# Patient Record
Sex: Female | Born: 1950 | Race: White | Marital: Married | Smoking: Never smoker
Health system: Western US, Academic
[De-identification: ages and names within clinical notes are randomized; demographics above are authoritative.]

## PROBLEM LIST (undated history)

## (undated) DIAGNOSIS — R471 Dysarthria and anarthria: Secondary | ICD-10-CM

## (undated) DIAGNOSIS — D492 Neoplasm of unspecified behavior of bone, soft tissue, and skin: Secondary | ICD-10-CM

## (undated) DIAGNOSIS — I10 Essential (primary) hypertension: Secondary | ICD-10-CM

## (undated) DIAGNOSIS — E119 Type 2 diabetes mellitus without complications: Secondary | ICD-10-CM

## (undated) DIAGNOSIS — H409 Unspecified glaucoma: Secondary | ICD-10-CM

## (undated) DIAGNOSIS — G8929 Other chronic pain: Secondary | ICD-10-CM

## (undated) DIAGNOSIS — G249 Dystonia, unspecified: Secondary | ICD-10-CM

## (undated) DIAGNOSIS — E6609 Other obesity due to excess calories: Secondary | ICD-10-CM

## (undated) DIAGNOSIS — E039 Hypothyroidism, unspecified: Secondary | ICD-10-CM

## (undated) DIAGNOSIS — E785 Hyperlipidemia, unspecified: Secondary | ICD-10-CM

## (undated) DIAGNOSIS — K900 Celiac disease: Secondary | ICD-10-CM

## (undated) DIAGNOSIS — G500 Trigeminal neuralgia: Secondary | ICD-10-CM

## (undated) DIAGNOSIS — I639 Cerebral infarction, unspecified: Secondary | ICD-10-CM

## (undated) DIAGNOSIS — G43909 Migraine, unspecified, not intractable, without status migrainosus: Secondary | ICD-10-CM

## (undated) DIAGNOSIS — M542 Cervicalgia: Secondary | ICD-10-CM

## (undated) DIAGNOSIS — Z6831 Body mass index (BMI) 31.0-31.9, adult: Secondary | ICD-10-CM

## (undated) HISTORY — DX: Essential (primary) hypertension: I10

## (undated) HISTORY — DX: Type 2 diabetes mellitus without complications: E11.9

## (undated) HISTORY — DX: Dysarthria and anarthria: R47.1

## (undated) HISTORY — DX: Neoplasm of unspecified behavior of bone, soft tissue, and skin: D49.2

## (undated) HISTORY — PX: APPENDECTOMY: SHX54

## (undated) HISTORY — PX: TONSILLECTOMY: SUR1361

## (undated) HISTORY — PX: CHOLECYSTECTOMY: SHX55

## (undated) HISTORY — PX: ABDOMINAL HYSTERECTOMY: SHX81

---

## 1989-10-08 HISTORY — PX: OTHER SURGICAL HISTORY: SHX169

## 2006-03-29 ENCOUNTER — Ambulatory Visit (HOSPITAL_BASED_OUTPATIENT_CLINIC_OR_DEPARTMENT_OTHER)

## 2006-04-05 ENCOUNTER — Ambulatory Visit (HOSPITAL_BASED_OUTPATIENT_CLINIC_OR_DEPARTMENT_OTHER)

## 2006-04-05 ENCOUNTER — Encounter (HOSPITAL_BASED_OUTPATIENT_CLINIC_OR_DEPARTMENT_OTHER): Payer: Self-pay | Admitting: Neurological Surgery

## 2006-04-08 NOTE — Progress Notes (Signed)
CLINIC: NEUROSURGERY    REPORT TYPE: ??    Dictating Practitioner: Wilburt Finlay, M.D.    DATE OF SERVICE: 04/05/2006    REASON FOR VISIT:follow up    Ms. Kimberly Rangel returns today after having had extensive imaging studies  because she had mild weakness of the triceps and some numbness on the right  side as well as perhaps some mild weakness in the leg. The imaging  studies, including an MRI, a CT scan of the cervical spine and plain  radiographs, are remarkably good. 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 As I indicated, I  have reviewed in detail the MRI, CT and plain radiographs and saw nothing.  The fusion of the neck is well healed from many years ago. It was  performed by a friend of mine, a Associate Professor in [place], Dr. Trena Platt, and everything looks perfect. She has a bit  of dysarthria because of the transoral approach with failure of the entire  palate to heal, but other than that has residual pain from this. We will  renew her Vicodin and Soma and she will only return if she is having  difficulty.      Job #: [id number]          Signature Derived From Controlled Access Password  Wilburt Finlay, M.D. 04/08/2006 09:50        DD: 04/05/2006 DT: 04/05/2006 5:00 P DocNo.: [id number]  [id number]      Primary Care Physician:  UNLISTED PHYSICIAN        cc:

## 2006-05-21 ENCOUNTER — Ambulatory Visit (HOSPITAL_BASED_OUTPATIENT_CLINIC_OR_DEPARTMENT_OTHER): Admitting: Nurse Practitioner

## 2013-01-29 ENCOUNTER — Encounter (HOSPITAL_BASED_OUTPATIENT_CLINIC_OR_DEPARTMENT_OTHER): Payer: Self-pay | Admitting: Neurology

## 2013-01-29 ENCOUNTER — Ambulatory Visit: Payer: Medicaid Other | Attending: Neurology | Admitting: Neurology

## 2013-01-29 VITALS — BP 163/99 | HR 83 | Wt 182.0 lb

## 2013-01-29 DIAGNOSIS — M436 Torticollis: Secondary | ICD-10-CM | POA: Insufficient documentation

## 2013-01-29 DIAGNOSIS — G500 Trigeminal neuralgia: Secondary | ICD-10-CM | POA: Insufficient documentation

## 2013-01-29 MED ORDER — METFORMIN HCL 850 MG OR TABS: 850.00 mg | ORAL_TABLET | Freq: Three times a day (TID) | ORAL | Status: AC

## 2013-01-29 MED ORDER — SYNTHROID PO: Freq: Every day | ORAL | Status: AC

## 2013-01-29 MED ORDER — GLYBURIDE PO: Freq: Two times a day (BID) | ORAL | Status: AC

## 2013-01-29 MED ORDER — CARBAMAZEPINE 200 MG OR TABS
400.00 mg | ORAL_TABLET | Freq: Two times a day (BID) | ORAL | Status: AC
Start: 2013-01-29 — End: ?

## 2013-01-29 MED ORDER — RIMEXOLONE 1 % OP SUSP: 1.00 [drp] | Freq: Four times a day (QID) | OPHTHALMIC | Status: AC

## 2013-01-29 MED ORDER — CARBAMAZEPINE 300 MG OR CP12
300.00 mg | ORAL_CAPSULE | Freq: Two times a day (BID) | ORAL | Status: DC
Start: ? — End: 2013-01-29

## 2013-01-29 MED ORDER — CARISOPRODOL 350 MG OR TABS
350.00 mg | ORAL_TABLET | ORAL | Status: DC
Start: ? — End: 2015-02-18

## 2013-01-29 MED ORDER — TIMOLOL 0.25 % OP SOLN: 1.00 [drp] | Freq: Every day | OPHTHALMIC | Status: AC

## 2013-01-29 MED ORDER — FENTANYL 50 MCG/HR TD PT72: 1.00 | MEDICATED_PATCH | TRANSDERMAL | Status: AC

## 2013-01-29 MED ORDER — OXYCODONE-ACETAMINOPHEN 10-325 MG OR TABS: 1.00 | ORAL_TABLET | Freq: Four times a day (QID) | ORAL | Status: AC | PRN

## 2013-01-29 NOTE — Progress Notes (Signed)
Referring MD: Sheral Flow, MD  [address] [place], [place] [address]    Reason for visit: trigeminal neuroalgia  Source: Patient    History of Present Illness:     Kimberly Rangel is a 62 year old female who is here for evaluation of the above chief complaint: Initially she suffered TN 10 years ago, lasting 8 weeks, possibly triggered after dentist. Second 4 years ago, started on CBZ which helped very well. Recurrence 2 more times since and helped with CBZ 300 ER (she has been on it chronicaly for 3 years apparently), but less help this time, which started a month ago. Also tried GBP that did not help. Ice helps, location in the right jaw line, almost all day, poor sleep, now constant very severe pain. Denies new focal neurological deficits except for hearing concerns for which she had an MRI. She has had hearing tests and seen by ENT. She has follow up. Describes some dystonia and migraine history not dealt with at this visit, on muscle relaxants and oxycodone. MRI done 2 months ago and possible showed a couple of lesions. No worsening with eating or talking, no trigger point.     Past Medical History   Diagnosis Date    HTN (hypertension)     Diabetes     Dysarthria     Cervical spine tumor      No past surgical history on file.  Allergies   Allergen Reactions    Keflet (Cephalexin) Swelling    Penicillins Swelling    Bactrim (Sulfamethoxazole W-Trimethoprim) Unspecified    Erythromycin Unspecified    Ibuprofen Diarrhea     STOMACH PAINS    Ultram (Tramadol) Unspecified     Current Outpatient Prescriptions   Medication Sig    carBAMazepine (CARBATROL) 300 MG XR capsule Take 300 mg by mouth 2 times daily.    carisoprodol (SOMA) 350 MG tablet Take 350 mg by mouth 4 times daily.    fentaNYL (DURAGESIC) 50 MCG/HR patch Apply 1 patch topically every 72 hours.    GLYBURIDE PO     Levothyroxine Sodium (SYNTHROID PO)     metFORMIN (GLUCOPHAGE) 850 MG tablet Take 850 mg by mouth 3  times daily.    oxycodone-acetaminophen (PERCOCET) 10-325 MG per tablet Take 1 tablet by mouth every 6 hours as needed for Severe Pain (Pain Score 7-10).    rimexolone (VEXOL) 1 % ophthalmic suspension Place 1 drop into both eyes 4 times daily.    timolol (BETIMOL) 0.25 % ophthalmic solution Place 1-2 drops into both eyes 2 times daily. use in affected eye(s)     No current facility-administered medications for this visit.     History     Social History    Marital Status: Married     Spouse Name: N/A     Number of Children: N/A    Years of Education: N/A     Social History Main Topics    Smoking status: Never Smoker     Smokeless tobacco: Not on file    Alcohol Use: No    Drug Use: Not on file    Sexually Active: Not on file     Other Topics Concern    Not on file     Social History Narrative    No narrative on file     Family History   Problem Relation Age of Onset    Cancer Mother     Heart Disease Mother  Cancer Father     Heart Disease Sister      Review of Systems: The patient completed a comprehensive ten system review, in chart, reviewed by myself with pertinent positives and negatives noted above.     Physical Examination:  Vitals noted in chart.  General appearance: no acute distress; no dysmorphic features; appears stated age, well nourished, well-groomed, and euthymic. Head is tilted with chin to left and up.   Fundoscopic exam: bilateral eyes with normal optic disc appearance and margins; normal appearing vessels.  Neck: supple, no meningismus.  Integument: no neurocutaneous stigmata.  Cardiovascular: regular rate, no murmurs, rubs, or gallops, no edema.  Lungs: CTABL  Abdo: soft, nt, nd  Ext: no c/c/e, no rash    Neurological Exam: Alert and oriented to person, place & date. Follows complex commands consistently. Normal attention. Short and long term verbal memory intact. Language is fluent, without aphasia, or paraphasic errors. No anomia. Normal Fund of Knowledge. Visual fields  full. Pupils equally round and reactive to light, extra-ocular movements intact, no nystagmus, normal smooth pursuit/saccade, no skew deviation. Testing of the facial sensation was intact light touch. Facial asymmetry, with left nasolabial fold increased. Hearing grossly intact to finger rub. Palate raised symmetrically, with uvula midline. Trapezius and Sternocleidomastoid were of normal strength, and there was good neck flexion and extension. Tongue was midline, without fasciculations, or atrophy. Bulk and tone were normal in all limbs without tremor. Strength was full in all limbs with no signs of pronator drift or orbiting. Sensory testing in all limbs was intact to light touch, pin prick/temperature, vibration, and proprioception. Reflex testing revealed (right/left out of 4): Biceps 2/2, Brachioradialis 2/2, Triceps 2/2, Patellar 2/2, Ankle 2/2; Crossed adductors negative; Plantar extensor response down going bilaterally. Coordination testing bilaterally revealed FTN without ataxia or intention tremor; HTS without ataxia; Foot tap regular rate and rhythm; Finger tap within normal limits; RAM rapid and regular. There is normal stance and gait, normal arm swing, negative Rombergs sign. Able to toe/heel walk, as well as tandem walk.    Labs/Studies: Chart reviewed.     Impression and Recommendations:   - Chronic recurrent right facial pain: Differential includes TN, TAC, musculoskeletal, dental, migraine. Will increase CBZ given good response in the past. No indication for further imaging at this time. She will bring imaging and reports at the next visit. She has had a blood test recently. Will consider Lamictal in the future, and she will discuss topical and injection management with her pain management doctor. Would trial indomethacin given TAC and PH are on the differential, except for severe apparent NSAID allergy that she reports.   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 Chin to left, and  elevated suggesting right SCM but no clear tonicity or hypertrophy. Decrease ROM, especially to the right. Some facial asymmetry possibly representing left facial dystonia. Has never had Botox. Will refer to movement disorder clinic for further evaluation and consideration of Botox. Will continue on muscle relaxants for now.   - Chronic intermittent headaches: Likely migraines with aura. Will work up further at the next visit.     Education regarding prognosis and expectations, reassurance, lifestyle, medication side effects, safety and return precautions was provided.     Follow up will be in a few months.     I discussed my assessment and reviewed the plan of action with the patient, who is in agreement.    Thank you for this kind consultation.    Please c.c.  a copy of this consultation to Dr. Sheral Flow, MD  [address]  [place], [place] [address]    Note Author: Rosie Fate. Otto Herb, MD

## 2013-05-18 ENCOUNTER — Telehealth (HOSPITAL_BASED_OUTPATIENT_CLINIC_OR_DEPARTMENT_OTHER): Payer: Self-pay | Admitting: Neurology

## 2013-05-18 NOTE — Telephone Encounter (Signed)
 Patient would like to know if all records was given to Dr. Chyrel Craw. Patient stated she gave medical records and MRI CDs to the front desk. Please advise. Patient has an appointment on 08/21.

## 2013-05-28 ENCOUNTER — Encounter (HOSPITAL_BASED_OUTPATIENT_CLINIC_OR_DEPARTMENT_OTHER): Payer: Self-pay | Admitting: Neurology

## 2013-05-28 ENCOUNTER — Ambulatory Visit: Payer: Medicaid Other | Attending: Neurology | Admitting: Neurology

## 2013-05-28 VITALS — BP 150/80 | HR 83 | Temp 98.4°F | Resp 16 | Ht 64.0 in | Wt 180.0 lb

## 2013-05-28 DIAGNOSIS — R52 Pain, unspecified: Secondary | ICD-10-CM | POA: Insufficient documentation

## 2013-05-28 NOTE — Progress Notes (Signed)
 Referring MD: Referring Md, Unknown Pcp Or, MD  No address on file    Reason for visit: pain, TN  Source: Patient    History of Present Illness:     Kimberly Rangel is a 62 year old female who is here for follow up evaluation of the above chief complaint: Interim history is significant for improvement with pain and carbamazepine . Still pending movement disorder visit with Dr Cranford Dixons. Torticollis and Writer's cramp, as does her mother.     Past Medical History   Diagnosis Date   . HTN (hypertension)    . Diabetes    . Dysarthria    . Cervical spine tumor      No past surgical history on file.  Allergies   Allergen Reactions   . Keflet [Cephalexin] Swelling   . Penicillins Swelling   . Bactrim [Sulfamethoxazole W-Trimethoprim] Unspecified   . Erythromycin Unspecified   . Ibuprofen Diarrhea     STOMACH PAINS   . Ultram [Tramadol] Unspecified     Current Outpatient Prescriptions   Medication Sig   . carBAMazepine  (TEGRETOL ) 200 MG tablet Take 2 tablets by mouth 2 times daily.   . carisoprodol  (SOMA ) 350 MG tablet Take 350 mg by mouth 4 times daily.   . fentaNYL  (DURAGESIC ) 50 MCG/HR patch Apply 1 patch topically every 72 hours.   . GLYBURIDE  PO    . Levothyroxine Sodium  (SYNTHROID  PO)    . metFORMIN  (GLUCOPHAGE ) 850 MG tablet Take 850 mg by mouth 3 times daily.   . oxycodone -acetaminophen  (PERCOCET) 10-325 MG per tablet Take 1 tablet by mouth every 6 hours as needed for Severe Pain (Pain Score 7-10).   . rimexolone  (VEXOL ) 1 % ophthalmic suspension Place 1 drop into both eyes 4 times daily.   . timolol  (BETIMOL ) 0.25 % ophthalmic solution Place 1-2 drops into both eyes 2 times daily. use in affected eye(s)     No current facility-administered medications for this visit.     History     Social History   . Marital Status: Married     Spouse Name: N/A     Number of Children: N/A   . Years of Education: N/A     Social History Main Topics   . Smoking status: Never Smoker    . Smokeless tobacco: Not on file   . Alcohol Use: No    . Drug Use: Not on file   . Sexual Activity: Not on file     Other Topics Concern   . Not on file     Social History Narrative   . No narrative on file     Family History   Problem Relation Age of Onset   . Cancer Mother    . HEART DISEAS Mother    . Cancer Father    . HEART DISEAS Sister      Physical Examination:  Vitals noted in chart.  General appearance: no acute distress; no dysmorphic features; appears stated age, well nourished, well-groomed, and euthymic.  Neck: supple, no meningismus.  Integument: no neurocutaneous stigmata.  Cardiovascular: regular rate, no edema.    Neurological Exam: Alert and oriented to person, place & date. Follows complex commands consistently. Normal attention. Short and long term verbal memory intact. Language is fluent, without aphasia, or paraphasic errors. No anomia. Normal Fund of Knowledge. Visual fields full. Pupils equally round and reactive to light, extra-ocular movements intact, no nystagmus, normal smooth pursuit/saccade, no skew deviation. Right face decreased sensation possibly. Jaw relaxes  to the right slightly, right face with less movement, slight depressor around the right mouth, chin up and to the left, left shoulder slightly elevated. Hearing grossly intact to finger rub. Palate raised symmetrically, with uvula midline. Speech nasal after neurosurgery. Trapezius and Sternocleidomastoid were of normal strength, and there was good neck flexion and extension. Tongue was midline, without fasciculations, or atrophy. Bulk and tone were normal in all limbs without tremor. Strength was full in all limbs with no signs of pronator drift or orbiting. Sensory testing in all limbs was intact to light touch, pin prick/temperature, vibration, and proprioception. Reflex testing revealed (right/left out of 4): Biceps 2/2, Brachioradialis 2/2, Triceps 2/2, Patellar 2/2, Ankle 2/2; Crossed adductors negative; Plantar extensor response down going bilaterally. Coordination testing  bilaterally revealed FTN without ataxia or intention tremor; HTS without ataxia; Foot tap regular rate and rhythm; Finger tap within normal limits; RAM rapid and regular. There is normal stance and gait, normal arm swing, negative Romberg's sign. Able to toe/heel walk, as well as tandem walk.     Labs/Studies: Chart reviewed.     Impression and Recommendations:   - Chronic recurrent right facial pain: Much improved with carbamazepine . Differential includes TN, TAC, musculoskeletal, dental, migraine, dystonia. Continue current level of CBZ given good response. Can consider Lamictal in the future if required, and she will discuss topical and injection management with her pain management doctor. Would trial indomethacin given TAC and PH are on the differential, except for severe apparent NSAID allergy that she reports.   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 Chin to left and elevated possibly suggesting right SCM but no clear tonicity or hypertrophy. Decrease ROM, especially to the right. Some facial asymmetry possibly representing left facial dystonia. Has never had Botox. Will refer to movement disorder clinic for further evaluation and consideration of Botox. Will continue on muscle relaxants for now.   - Chronic intermittent headaches: Likely migraines with aura. Continue current management with PMD.     Education regarding prognosis and expectations, reassurance, lifestyle, medication side effects, safety and return precautions was provided.     Follow up with Dr Cranford Dixons as previously arranged, and myself as needed.     Education regarding prognosis and expectations, lifestyle, medication side effects, safety and return precautions was provided.     Follow up will be in a few months.    I discussed my assessment and reviewed the plan of action with the patient, who is in agreement.    Please c.c. a copy of this consultation to Dr. Referring Md, Unknown Pcp Or, MD  No address on file    Note Author:  Breck Calix. Chyrel Craw, MD

## 2013-07-21 ENCOUNTER — Ambulatory Visit (INDEPENDENT_AMBULATORY_CARE_PROVIDER_SITE_OTHER): Payer: Medicaid Other | Admitting: Neurology

## 2013-08-21 ENCOUNTER — Ambulatory Visit (INDEPENDENT_AMBULATORY_CARE_PROVIDER_SITE_OTHER): Payer: Medicaid Other | Admitting: Neurology

## 2013-08-21 ENCOUNTER — Encounter (INDEPENDENT_AMBULATORY_CARE_PROVIDER_SITE_OTHER): Payer: Self-pay | Admitting: Neurology

## 2013-08-21 VITALS — BP 132/91 | HR 94 | Temp 98.0°F

## 2013-08-21 MED ORDER — JANUVIA PO: Freq: Every day | ORAL | Status: AC

## 2013-08-26 ENCOUNTER — Ambulatory Visit (HOSPITAL_BASED_OUTPATIENT_CLINIC_OR_DEPARTMENT_OTHER): Payer: Medicaid Other | Admitting: Neurology

## 2013-09-23 ENCOUNTER — Encounter (HOSPITAL_BASED_OUTPATIENT_CLINIC_OR_DEPARTMENT_OTHER): Payer: Self-pay | Admitting: Neurology

## 2013-09-23 ENCOUNTER — Ambulatory Visit: Payer: Medicaid Other | Attending: Neurology | Admitting: Neurology

## 2013-09-23 VITALS — BP 156/86 | HR 86 | Temp 97.6°F | Ht 64.0 in | Wt 180.0 lb

## 2013-09-23 DIAGNOSIS — R52 Pain, unspecified: Secondary | ICD-10-CM | POA: Insufficient documentation

## 2013-09-23 NOTE — Progress Notes (Addendum)
 Referring MD: Referring Md, Unknown Pcp Or, MD  No address on file    Reason for visit: pain, TN  Source: Patient    History of Present Illness:     Kimberly Rangel is a 62 year old female who is here for follow up evaluation of the above chief complaint: Interim history is significant for left thalamic stroke and residual pain over the right hemibody in a patchy distribution. She is to follow up with stroke clinic in a couple of weeks. She has HTN and Diabetes. She stopped the CBZ as she does not have the facial pain at this time. Seen by Dr Cranford Dixons, and not felt candidate for Botox for torticollis. She would like treatment for her pain, but is not interested in Lyrica as she doesn't want to gain weight and worried about suicidality. She generally is against taking medications for fear of weight and suicidality. She does not have her records from [facility] for review regarding her blood tests and imaging. She does not smoke or drink. Does little exercise. Glucose control apparently fair, as is BP control, although today 156/86, which she feels is due to stress. At home she states it is much better.     Past Medical History   Diagnosis Date   . HTN (hypertension)    . Diabetes    . Dysarthria    . Cervical spine tumor      No past surgical history on file.  Allergies   Allergen Reactions   . Bactrim [Sulfamethoxazole W-Trimethoprim] Rash and Unspecified   . Erythromycin Swelling and Unspecified   . Keflet [Cephalexin] Swelling   . Penicillins Swelling   . Gluten Meal Other     Rye, barley, soy, peanut, oatmeal.   . Ibuprofen Diarrhea     STOMACH PAINS  bleeding   . Ultram [Tramadol] Unspecified     Current Outpatient Prescriptions   Medication Sig   . carBAMazepine  (TEGRETOL ) 200 MG tablet Take 2 tablets by mouth 2 times daily.   . carisoprodol  (SOMA ) 350 MG tablet Take 350 mg by mouth. 2-4 x day   . fentaNYL  (DURAGESIC ) 50 MCG/HR patch Apply 1 patch topically every 72 hours.   . GLYBURIDE  PO 2 times daily.   .  Levothyroxine Sodium  (SYNTHROID  PO) daily.   . metFORMIN  (GLUCOPHAGE ) 850 MG tablet Take 850 mg by mouth 3 times daily.   . oxycodone -acetaminophen  (PERCOCET) 10-325 MG per tablet Take 1 tablet by mouth every 6 hours as needed for Severe Pain (Pain Score 7-10).   . rimexolone  (VEXOL ) 1 % ophthalmic suspension Place 1 drop into both eyes 4 times daily.   . SitaGLIPtin  Phosphate (JANUVIA  PO) daily.   . timolol  (BETIMOL ) 0.25 % ophthalmic solution Place 1-2 drops into both eyes daily. use in affected eye(s)     No current facility-administered medications for this visit.     No current facility-administered medications for this visit.        Social History   . Marital Status: Married     Spouse Name: N/A     Number of Children: N/A   . Years of Education: N/A     Social History Main Topics   . Smoking status: Never Smoker    . Smokeless tobacco: Not on file   . Alcohol Use: No   . Drug Use: Not on file   . Sexual Activity: Not on file     Other Topics Concern   . Not on file  Social History Narrative   . No narrative on file     Family History   Problem Relation Age of Onset   . Cancer Mother    . HEART DISEAS Mother    . Cancer Father    . HEART DISEAS Sister      Physical Examination:  Vitals noted in chart.  General appearance: no acute distress; no dysmorphic features; appears stated age, well nourished, well-groomed, and euthymic.  Neck: supple, no meningismus.  Integument: no neurocutaneous stigmata.  Cardiovascular: regular rate, no edema.  Abdo: soft, nt, nd  Ext: no c/c/e    Neurological Exam: Alert and oriented to person, place & date. Follows complex commands consistently. Normal attention. Short and long term verbal memory intact. Language is fluent, without aphasia, or paraphasic errors. No anomia. Normal Fund of Knowledge. Visual fields full. Pupils equally round and reactive to light, extra-ocular movements intact, no nystagmus, normal smooth pursuit/saccade, no skew deviation. Right face decreased  sensation possibly. Jaw relaxes to the right slightly, right face with less movement, slight depressor around the right mouth, chin up and to the left, left shoulder slightly elevated. Hearing grossly intact to finger rub. Palate raised symmetrically, with uvula midline. Speech nasal after neurosurgery. Trapezius and Sternocleidomastoid were of normal strength, and there was good neck flexion and extension. Tongue was midline, without fasciculations, or atrophy. Bulk and tone were normal in all limbs without tremor. Strength was full in all limbs with no signs of pronator drift or orbiting. Sensory testing was decreased in right hand and foot. Reflex testing revealed (right/left out of 4): Biceps 2/2, Brachioradialis 2/2, Triceps 2/2, Patellar 2/2, Ankle 2/2; Crossed adductors negative; Plantar extensor response down going bilaterally. Coordination testing bilaterally revealed FTN without ataxia or intention tremor; HTS without ataxia; Foot tap regular rate and rhythm; Finger tap within normal limits; RAM rapid and regular. There is normal stance and gait, normal arm swing, negative Romberg's sign. Able to toe/heel walk, as well as tandem walk.     Labs/Studies: Chart reviewed.     Impression and Recommendations:   - Chronic recurrent right facial pain: Much improved with carbamazepine , and no longer taking this as there is no pain. She was informed that she could try the carbamazepine  for her pain symptoms, which she will try out. Differential of face pain includes most likely TN, and less likely TAC, musculoskeletal, dental, migraine, dystonia. Would trial indomethacin given TAC and PH are on the differential, except for severe apparent NSAID allergy that she reports.   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 Chin to left and elevated possibly suggesting right SCM but no clear tonicity or hypertrophy. Decrease ROM, especially to the right. Some facial asymmetry  possibly representing left facial dystonia.Not considered Botox candidate and not particularly interested. Will continue on muscle relaxants for now.   - Chronic intermittent headaches: Likely migraines with aura. Continue current management with PMD.  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 Reports sound like left thalamic small vessel lacune. Educated on lifestyle changes, BP, Glucose control, and to initiate her Carbamazepine  to see if this helps with the pain as she is cautious of most other medications suggested. She will bring imaging, reports, and labs to her Stroke consult in a couple of weeks for review and management. Unable to take aspirin or Plavix.     Education regarding prognosis and expectations, reassurance, lifestyle, medication side effects, safety and return precautions was provided.  Follow up with as needed.     Education regarding prognosis and expectations, lifestyle, medication side effects, safety and return precautions was provided.     Follow up will be in a few months.    I discussed my assessment and reviewed the plan of action with the patient, who is in agreement.    Please c.c. a copy of this consultation to Dr. Referring Md, Unknown Pcp Or, MD  No address on file    Note Author: Breck Calix. Chyrel Craw, MD

## 2013-10-21 ENCOUNTER — Ambulatory Visit: Payer: Medicaid Other | Attending: Vascular Neurology | Admitting: Vascular Neurology

## 2013-10-21 VITALS — BP 157/77 | HR 93 | Temp 98.1°F | Ht 64.0 in

## 2013-10-21 DIAGNOSIS — I6381 Other cerebral infarction due to occlusion or stenosis of small artery: Secondary | ICD-10-CM

## 2013-10-21 DIAGNOSIS — I6350 Cerebral infarction due to unspecified occlusion or stenosis of unspecified cerebral artery: Principal | ICD-10-CM | POA: Insufficient documentation

## 2013-10-21 NOTE — Progress Notes (Signed)
---------------------(data below generated by Shela Commons)------------------------    NEUROLOGY NOTE  Demographics:  Date: October 21, 2013   Patient Name: Kimberly Rangel   Medical Record #: [id number]   DOB: September 03, 1951  Age: 63 year old  Sex: female  Requested by:      Olen Cordial  Clinic Location:      [facility]  [place] [address]  Evaluator(s):               Sharalyn Ink was evaluated by: Neurology Attending  Teaching Statement:               A Full History (including allergies, medications, past medical & surgical history, social history, family history, 10 system review of systems) and complete physical exam as performed by Shela Commons, are as transcribed in this and/or the accompanying note. Please also see the patient questionnaire for details.  ----------    Chief Complaint   Patient presents with    Cerebro-vascular Accident      History of Present Illness:     Kimberly Rangel is a 63 year old female who is here for evaluation of the above chief complaint:  Patient has hx of torticollis for which she follows with Dr Nicki Guadalajara and Dr Jerl Santos.    She also has a hx of thalamic stroke for which she is refered here  She had eval at [facility] in the apst and recent MRI at [facility] 10/14  Workup reviewed below.    Current sxs of right sided numbness of face, arm and leg.  She is on tegretol  She has a pain speciailst and primaryc are MD    MRI + for left thalamic lacune   Carotid US (-)      Review of Systems:   No fevers, chills, nausea or vomiting.  No chest pain or shortness of breath.       Allergies:   Allergies   Allergen Reactions    Bactrim [Sulfamethoxazole W-Trimethoprim] Rash and Unspecified    Erythromycin Swelling and Unspecified    Keflet [Cephalexin] Swelling    Penicillins Swelling    Gluten Meal Other     Rye, barley, soy, peanut, oatmeal.    Ibuprofen Diarrhea     STOMACH PAINS  bleeding    Ultram  [Tramadol] Unspecified       Current Outpatient Prescriptions   Medication Sig    carBAMazepine (TEGRETOL) 200 MG tablet Take 2 tablets by mouth 2 times daily.    carisoprodol (SOMA) 350 MG tablet Take 350 mg by mouth. 2-4 x day    fentaNYL (DURAGESIC) 50 MCG/HR patch Apply 1 patch topically every 72 hours.    GLYBURIDE PO 2 times daily.    Levothyroxine Sodium (SYNTHROID PO) daily.    metFORMIN (GLUCOPHAGE) 850 MG tablet Take 850 mg by mouth 3 times daily.    oxycodone-acetaminophen (PERCOCET) 10-325 MG per tablet Take 1 tablet by mouth every 6 hours as needed for Severe Pain (Pain Score 7-10).    rimexolone (VEXOL) 1 % ophthalmic suspension Place 1 drop into both eyes 4 times daily.    SitaGLIPtin Phosphate (JANUVIA PO) daily.    timolol (BETIMOL) 0.25 % ophthalmic solution Place 1-2 drops into both eyes daily. use in affected eye(s)     No current facility-administered medications for this visit.     Past Medical History   Diagnosis Date  HTN (hypertension)     Diabetes     Dysarthria     Cervical spine tumor        No past surgical history on file.    Family History:    Family History   Problem Relation Age of Onset    Cancer Mother     HEART DISEAS Mother     Cancer Father     HEART DISEAS Sister        History     Social History    Marital Status: Married     Spouse Name: N/A     Number of Children: N/A    Years of Education: N/A     Social History Main Topics    Smoking status: Never Smoker     Smokeless tobacco: Not on file    Alcohol Use: No    Drug Use: Not on file    Sexual Activity: Not on file     Other Topics Concern    Not on file     Social History Narrative    No narrative on file   Other Social History (in addition to above):   Lives in (city or state): [address] [place] [address]  Companion (with patient): husband    Review of Systems: Please see patient questionnaire for further details.  No fever/ chills/ nausea/ vomiting. No chest pain/ shortness of  breath.    Exam:   On Exam today, I find:   Vital signs: Recorded in chart, and reviewed by me today.  BP 157/77   Pulse 93   Temp(Src) 98.1 F (36.7 C) (Oral)   Ht 5\' 4"  (1.626 m)   SpO2 100%    <General Exam>  General: Patient is well developed, & in no acute distress.   HEENT: Normocephalic & atraumatic.   Visual fields intact bilaterally. PERRLA. EOMI. Oropharynx clear. Hearing intact. Neck supple. Thyroid not enlarged. Trachea midline. No lymphadenopathy.   CV: Regular rate & rhythm.    Lungs: Clear to ascultation anteriorly bilaterally.   Extremities: No clubbing, cyanosis, or edema.    Pulses: Equal throughout.    <Neurologic Exam>  Neurologic Exam: HIF: Oriented to person/ place/ time. Intact recent & remote memory. Appropriate mood & affect. Normal attention span & concentration. Normal language functions (naming, repetition, comprehension, spontaneous speech). Normal fund of knowledge. No neglect or inattention.   CN: Cranial Nerves II-XII intact throughout: except for perhaps abnl sensation right face and dysarthria (also perhaps related to dentition)      Labs/Imaging/ Studies:       RELEVANT RESULTS/ENCOUNTERS (in visit navigator) were reviewed: Yes  Radiographic film images were personally reviewed by me: No  Outside medical records were personally reviewed by me: Yes    Other Imaging Results:   Carotid US (-) 10/14  (This radiographic image was personally reviewed by me today).    MRI + left thalamic cva  (This radiographic image was personally reviewed by me today).      Impression/ Plan:   <-Assessment->: 63 year old female with hx of thalamic stroke.  Needs risk factor modification and continued pain control.  Has requested I make recommendation to her primary MD and pain specialist through her.  I will provide her with this after visit summary    <-Diagnoses & Orders (this encounter)->:     ICD-9-CM   1. Stroke, lacunar 434.91     <-Plan->:   1. Speak with primary care MD  about stroke risk  factors:  Please bring this to Dr Danise Mina   Goal LDL <90   Goal A1c < 6.0  2. Patient to speak with her pain Specialist (Dr Albertine Patricia) about possible other medications for thalamic pain such as Gabapentin.  Will defer this to him and patient to decide    No need to return to this clinic        Note Author: Shela Commons

## 2013-10-21 NOTE — Patient Instructions (Signed)
1. Speak with primary care MD about stroke risk factors:  Please bring this to Dr Danise Mina   Goal LDL <90   Goal A1c < 6.0  2. Patient to speak with her pain Specialist (Dr Albertine Patricia) about possible other medications for thalamic pain such as Gabapentin.  Will defer this to him and patient to decide    No need to return to this clinic

## 2013-11-13 ENCOUNTER — Other Ambulatory Visit: Payer: Self-pay

## 2014-07-26 ENCOUNTER — Telehealth (HOSPITAL_BASED_OUTPATIENT_CLINIC_OR_DEPARTMENT_OTHER): Payer: Self-pay | Admitting: Vascular Neurology

## 2014-07-26 NOTE — Telephone Encounter (Signed)
LM on machine to contact PCP with symptoms.

## 2014-07-26 NOTE — Telephone Encounter (Signed)
Patient is calling requesting a sooner appt stating she is having difficulties swallowing and with her voice. She would like to see if she can get in sooner she is concern. Please advise thank you       [phone] best contact

## 2014-08-03 ENCOUNTER — Encounter (HOSPITAL_BASED_OUTPATIENT_CLINIC_OR_DEPARTMENT_OTHER): Payer: Self-pay | Admitting: Family Medicine

## 2014-08-03 ENCOUNTER — Encounter (INDEPENDENT_AMBULATORY_CARE_PROVIDER_SITE_OTHER): Payer: Self-pay | Admitting: Family Medicine

## 2014-08-03 ENCOUNTER — Telehealth (HOSPITAL_BASED_OUTPATIENT_CLINIC_OR_DEPARTMENT_OTHER): Payer: Self-pay

## 2014-08-03 DIAGNOSIS — G249 Dystonia, unspecified: Principal | ICD-10-CM

## 2014-08-03 NOTE — Telephone Encounter (Signed)
Outside referral scanned into media for triage

## 2014-08-12 NOTE — Telephone Encounter (Signed)
Please triage outside referral. Thank you.

## 2014-08-12 NOTE — Telephone Encounter (Signed)
Please schedule pt in fellows clinic to establish care at [facility]

## 2014-08-24 ENCOUNTER — Telehealth (INDEPENDENT_AMBULATORY_CARE_PROVIDER_SITE_OTHER): Payer: Self-pay | Admitting: Anesthesiology

## 2014-08-24 ENCOUNTER — Ambulatory Visit (INDEPENDENT_AMBULATORY_CARE_PROVIDER_SITE_OTHER): Payer: Medicaid Other | Admitting: Gastroenterology

## 2014-08-24 ENCOUNTER — Encounter (INDEPENDENT_AMBULATORY_CARE_PROVIDER_SITE_OTHER): Payer: Self-pay | Admitting: Gastroenterology

## 2014-08-24 VITALS — BP 133/83 | HR 76 | Temp 99.2°F | Ht 64.0 in | Wt 175.0 lb

## 2014-08-24 DIAGNOSIS — R1013 Epigastric pain: Secondary | ICD-10-CM

## 2014-08-24 DIAGNOSIS — R1310 Dysphagia, unspecified: Secondary | ICD-10-CM

## 2014-08-24 DIAGNOSIS — R110 Nausea: Secondary | ICD-10-CM

## 2014-08-24 DIAGNOSIS — K2970 Gastritis, unspecified, without bleeding: Principal | ICD-10-CM

## 2014-08-24 MED ORDER — LANSOPRAZOLE 30 MG OR CPDR
30.00 mg | DELAYED_RELEASE_CAPSULE | Freq: Every day | ORAL | Status: AC
Start: 2014-08-24 — End: ?

## 2014-08-24 NOTE — Progress Notes (Signed)
Consulting Provider: Raleigh Nation*    Reason for Visit: abd pain    History of Present Illness:   Kimberly Rangel is a 63 year old female with hx of dystonia, who has 61mof abd pain, nausea.  Had similar sx 3 years ago and had egd with reported gastritis.  Also says she was told she was gluten intolerant.  Not sure if has celiac.  Doesn't recall how dx was made.  She says she was found to be allergic to multiple foods and those were cut out and felt better until recently when sx have returned.  Gets nausea every morning.  Occ vomiting but just bile.  No blood.  Feels an "irritation" in mid abd like an open wound.  Worse with bm.  Occ has some urgency.  Alternating constipation/diarrhea but mild.  Has 1-2 bm/d but feels like can't evacuate completely.  No hard stools.  Then if has another bm it is soft.  No watery or loose stools.  No blood.  Denies any nsaids.  Not sure if had H.pylori tested.  Thinks sx may be worse recently due to worsening of dystonia.  No recent abx, illnesses, change in meds.    Does have hx of dysphagia to solids for many years.  Feels like food gets stuck in lower throat.  Doesn't think there was anything on egd to cause this.  Has new sx of some difficulty initiating swallows more recently.  Has to drink with straw to help this.  This seems to correlate with worsening of her dystonia.  Also has hx of lacunar stroke.    Patient Active Problem List    Diagnosis Date Noted    Sensory loss 08/21/2013    Stroke, lacunar 08/21/2013    Torticollis, acquired 08/21/2013       Past Medical History   Diagnosis Date    HTN (hypertension)     Diabetes     Dysarthria     Cervical spine tumor        No past surgical history on file.    Current Medications:    carBAMazepine (TEGRETOL) 200 MG tablet Take 2 tablets by mouth 2 times daily.   carisoprodol (SOMA) 350 MG tablet Take 350 mg by mouth. 2-4 x day   fentaNYL (DURAGESIC) 50 MCG/HR patch Apply 1 patch topically every 72 hours.    GLYBURIDE PO 2 times daily.   lansoprazole (PREVACID) 30 MG capsule Take 1 capsule (30 mg) by mouth daily.   Levothyroxine Sodium (SYNTHROID PO) daily.   metFORMIN (GLUCOPHAGE) 850 MG tablet Take 850 mg by mouth 3 times daily.   oxycodone-acetaminophen (PERCOCET) 10-325 MG per tablet Take 1 tablet by mouth every 6 hours as needed for Severe Pain (Pain Score 7-10).   rimexolone (VEXOL) 1 % ophthalmic suspension Place 1 drop into both eyes 4 times daily.   SitaGLIPtin Phosphate (JANUVIA PO) daily.   timolol (BETIMOL) 0.25 % ophthalmic solution Place 1-2 drops into both eyes daily. use in affected eye(s)       Allergies:  Allergies   Allergen Reactions    Bactrim [Sulfamethoxazole W-Trimethoprim] Rash and Unspecified    Erythromycin Swelling and Unspecified    Fruit Extracts Anaphylaxis     Melons    Keflet [Cephalexin] Swelling    Penicillins Swelling    Gluten Meal Other     Rye, barley, soy, peanut, oatmeal, peas    Ibuprofen Diarrhea     STOMACH PAINS  bleeding    Ultram [  Tramadol] Unspecified       History     Social History    Marital Status: Married     Spouse Name: N/A     Number of Children: N/A    Years of Education: N/A     Occupational History    Not on file.     Social History Main Topics    Smoking status: Never Smoker     Smokeless tobacco: Never Used    Alcohol Use: No    Drug Use: Not on file    Sexual Activity: Not on file     Other Topics Concern    Not on file     Social History Narrative       Family History   Problem Relation Age of Onset    Cancer Mother     HEART DISEAS Mother     Cancer Father     HEART DISEAS Sister    No FH of GI disease    Review of Systems:  See scanned patient history form.    Physical examination:   BP 133/83 mmHg   Pulse 76   Temp(Src) 99.2 F (37.3 C) (Oral)   Ht 5' 4"  (1.626 m)   Wt 79.379 kg (175 lb)   BMI 30.02 kg/m2  General:  WD, WN in NAD  HEENT:  PERRL, EOMI, oropharynx clear  Lungs:  Clear to auscultation bilaterally  Cardiovascular:   Regular rate and rhythm without murmurs, rubs or gallops  Abdomen:  Bowel sounds present, soft, nontender, nondistended, no hepatosplenomegaly, no masses  Extremities:  No peripheral edema  Skin:  No rashes    Labs Reviewed:  No results found for: WBC, RBC, HGB, HCT, MCV, PLT  No results found for: AST, ALT, ALK, ALB, TBILI, DBILI      Impression:   63 year old female with dystonia who has abd pain, nausea, mild stool sx, dysphagia.  Could be some gastritis so will give trial of ppi.  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  Will get records of egd/colon to see exactly what was seen and done.  May need to repeat egd if no improvement.  The dysphagia to solids sounds esophageal but doesn't sound like anything seen on egd.  Now having more oropharyngeal dysphagia which may be due to dystonia.  Will get swallow study to clarify degree of impairment.  If we repeat egd can consider empiric dilation.    Plan:  -get old records  -trial of ppi  -swallow study  -rtc 27m

## 2014-08-24 NOTE — Telephone Encounter (Signed)
Please send medications to pharmacy again, pharmacy told patient no medication has been called in.

## 2014-08-24 NOTE — Telephone Encounter (Signed)
Call into pharmacy rx provided to pharmacist as prescribed by Dr. Tawnya Crook

## 2014-08-24 NOTE — Telephone Encounter (Signed)
Patient called and said her insurance will not cover the lansoprazole. The medication either needs a PA  or an alternate medication needs to be prescribed. Thank you.

## 2014-08-27 ENCOUNTER — Telehealth (INDEPENDENT_AMBULATORY_CARE_PROVIDER_SITE_OTHER): Payer: Self-pay | Admitting: Neurology

## 2014-08-27 NOTE — Telephone Encounter (Signed)
Patient called in stating " I can't wait until Feb to be seen i am in a lot of pain. I am having trouble with words. Things don't feel right. I have been waiting four months to be seen! Is there anyone else that can see me sooner?? I liked Dr Nicki Guadalajara but i will see anybody who treats for Dystonia."     Patient was very stressed out and crying. Please advise.    Kimberly Rangel (patient) [phone]

## 2014-08-28 NOTE — Telephone Encounter (Signed)
OK for resident clinic.

## 2014-08-30 ENCOUNTER — Telehealth (INDEPENDENT_AMBULATORY_CARE_PROVIDER_SITE_OTHER): Payer: Self-pay | Admitting: Neurology

## 2014-08-30 NOTE — Telephone Encounter (Signed)
A user error has taken place: encounter opened in error, closed for administrative reasons.

## 2014-08-30 NOTE — Telephone Encounter (Signed)
Will route to front office staff (Ed, Christina, Donna, Veronica, Marvie, Danny, Maribel.) to schedule as instructed.

## 2014-08-30 NOTE — Telephone Encounter (Signed)
I called patient to schedule return appt in the residents clinic. No answer. Please schedule as a return in the residents clinic when she calls back.        Call Documentation     Desiree Hane at 08/30/2014 10:03 AM     Status: Signed        Expand All Collapse All   Will route to front office staff Jaquita Rector, Dannielle Huh.) to schedule as instructed.                Sung Amabile., MD at 08/28/2014 1:48 PM     Status: Signed        Expand All Collapse All   OK for resident clinic.                 Meyer Cory at 08/27/2014 3:43 PM     Status: Signed        Expand All Collapse All   Patient called in stating " I can't wait until Feb to be seen i am in a lot of pain. I am having trouble with words. Things don't feel right. I have been waiting four months to be seen! Is there anyone else that can see me sooner?? I liked Dr Nicki Guadalajara but i will see anybody who treats for Dystonia."     Patient was very stressed out and crying. Please advise.    Kimberly Rangel (patient) [phone]

## 2014-09-15 ENCOUNTER — Ambulatory Visit (HOSPITAL_BASED_OUTPATIENT_CLINIC_OR_DEPARTMENT_OTHER): Payer: Medicaid Other | Admitting: Vascular Neurology

## 2014-09-20 ENCOUNTER — Telehealth (INDEPENDENT_AMBULATORY_CARE_PROVIDER_SITE_OTHER): Payer: Self-pay | Admitting: Neurology

## 2014-09-20 NOTE — Telephone Encounter (Signed)
Pt requesting a call back.  Pt msg-  "I was in the hospital and they wanted me to take baby aspirin or plavix and I am allergic to nsaids.  I need to talk to the MD about this.  Please have him call me at [phone] or [phone].  Have him call me back as soon as possible please."

## 2014-09-21 NOTE — Telephone Encounter (Signed)
Patient should discuss this with Dr. Bjorn Loser her stroke specialist.    Enrique Sack B. Melissa Noon, M.D.  Associate Professor of [employer] [facility]  [address]  [place], [place] [address]  [phone] Tel  [phone] Fax  [email or web address]

## 2014-09-24 ENCOUNTER — Telehealth (HOSPITAL_BASED_OUTPATIENT_CLINIC_OR_DEPARTMENT_OTHER): Payer: Self-pay

## 2014-09-24 NOTE — Telephone Encounter (Signed)
I have attempted to reach the patient and schedule a therapy appointment. I was able to leave a message and provide our main line [phone], so the patient may call us back to schedule at their earliest convenience.       Thank you,  Rehabilitation Services

## 2014-09-28 ENCOUNTER — Telehealth (HOSPITAL_BASED_OUTPATIENT_CLINIC_OR_DEPARTMENT_OTHER): Payer: Self-pay | Admitting: Vascular Neurology

## 2014-09-28 NOTE — Telephone Encounter (Signed)
Pt requesting a call back "as soon as possible."  Pt msg-  "I have been rescheduled again and it isn't my fault.  I am having some problems with my medications and my stroke has really affected me.  Please have the doctor call me back as soon as possible.  My cell is [phone]."

## 2014-09-29 NOTE — Telephone Encounter (Signed)
Please have patient speak with her PCP about problems with medications  Please have her speak with Dr Albertine Patricia about problems with pain  Unfortunately, I do not have any new recommendations that would be helpful regarding her stroke management

## 2014-10-05 ENCOUNTER — Emergency Department
Admission: EM | Admit: 2014-10-05 | Discharge: 2014-10-05 | Disposition: A | Discharge status: Home / Self Care | Payer: Medicaid Other | Attending: Emergency Medicine | Admitting: Emergency Medicine

## 2014-10-05 ENCOUNTER — Encounter (INDEPENDENT_AMBULATORY_CARE_PROVIDER_SITE_OTHER): Payer: Medicaid Other | Admitting: Neurology

## 2014-10-05 DIAGNOSIS — G44219 Episodic tension-type headache, not intractable: Secondary | ICD-10-CM

## 2014-10-05 DIAGNOSIS — I6381 Other cerebral infarction due to occlusion or stenosis of small artery: Secondary | ICD-10-CM

## 2014-10-05 DIAGNOSIS — I10 Essential (primary) hypertension: Secondary | ICD-10-CM | POA: Insufficient documentation

## 2014-10-05 DIAGNOSIS — E119 Type 2 diabetes mellitus without complications: Secondary | ICD-10-CM | POA: Insufficient documentation

## 2014-10-05 DIAGNOSIS — I69392 Facial weakness following cerebral infarction: Secondary | ICD-10-CM | POA: Insufficient documentation

## 2014-10-05 DIAGNOSIS — R471 Dysarthria and anarthria: Secondary | ICD-10-CM | POA: Insufficient documentation

## 2014-10-05 DIAGNOSIS — G249 Dystonia, unspecified: Secondary | ICD-10-CM | POA: Insufficient documentation

## 2014-10-05 DIAGNOSIS — M797 Fibromyalgia: Secondary | ICD-10-CM | POA: Insufficient documentation

## 2014-10-05 DIAGNOSIS — R200 Anesthesia of skin: Secondary | ICD-10-CM | POA: Insufficient documentation

## 2014-10-05 DIAGNOSIS — I69351 Hemiplegia and hemiparesis following cerebral infarction affecting right dominant side: Secondary | ICD-10-CM | POA: Insufficient documentation

## 2014-10-05 DIAGNOSIS — R51 Headache: Principal | ICD-10-CM | POA: Insufficient documentation

## 2014-10-05 LAB — CBC WITH DIFF, BLOOD
ANC-Automated: 3.7 10*3/uL (ref 1.6–7.0)
ANC-Automated: 3.7 10*3/uL (ref 1.6–7.0)
Abs Basophils: 0.1 10*3/uL (ref <0.1)
Abs Eosinophils: 0.2 10*3/uL (ref 0.1–0.7)
Abs Eosinophils: 0.2 10*3/uL (ref 0.1–0.7)
Abs Lymphs: 2.2 10*3/uL (ref 0.8–3.1)
Abs Lymphs: 2.4 10*3/uL (ref 0.8–3.1)
Abs Monos: 0.5 10*3/uL (ref 0.2–0.8)
Abs Monos: 0.6 10*3/uL (ref 0.2–0.8)
Basophils: 1 % (ref 0–2)
Basophils: 1 % (ref 0–2)
Eosinophils: 3 % (ref 1–4)
Eosinophils: 4 % (ref 1–4)
Hct: 38.9 % (ref 34.0–45.0)
Hct: 39.4 % (ref 34.0–45.0)
Hgb: 13.3 gm/dL (ref 11.2–15.7)
Hgb: 13.7 gm/dL (ref 11.2–15.7)
Imm Gran %: 1 % (ref <1)
Lymphocytes: 33 % (ref 19–53)
Lymphocytes: 35 % (ref 19–53)
MCH: 30.4 pg (ref 26.0–32.0)
MCH: 30.9 pg (ref 26.0–32.0)
MCHC: 34.2 % (ref 32.0–36.0)
MCHC: 34.8 % (ref 32.0–36.0)
MCV: 88.9 um3 (ref 79.0–95.0)
MCV: 89 um3 (ref 79.0–95.0)
MPV: 11 fL (ref 9.4–12.4)
MPV: 11.5 fL (ref 9.4–12.4)
Monocytes: 7 % (ref 5–12)
Monocytes: 9 % (ref 5–12)
Plt Count: 204 10*3/uL (ref 140–370)
Plt Count: 212 10*3/uL (ref 140–370)
RBC: 4.37 10*6/uL (ref 3.90–5.20)
RBC: 4.43 10*6/uL (ref 3.90–5.20)
RDW: 12.5 % (ref 12.0–14.0)
RDW: 12.5 % (ref 12.0–14.0)
Segs: 53 % (ref 34–71)
Segs: 56 % (ref 34–71)
WBC: 6.6 10*3/uL (ref 4.0–10.0)
WBC: 6.9 10*3/uL (ref 4.0–10.0)

## 2014-10-05 LAB — COMPREHENSIVE METABOLIC PANEL, BLOOD
ALT (SGPT): 16 U/L (ref 0–33)
AST (SGOT): 15 U/L (ref 0–32)
Albumin: 3.6 g/dL (ref 3.5–5.2)
Alkaline Phos: 106 U/L (ref 35–140)
Anion Gap: 16 mmol/L — ABNORMAL HIGH (ref 7–15)
BUN: 12 mg/dL (ref 6–20)
Bicarbonate: 22 mmol/L (ref 22–29)
Bilirubin, Tot: 0.24 mg/dL (ref <1.20)
Calcium: 9.6 mg/dL (ref 8.5–10.6)
Chloride: 97 mmol/L — ABNORMAL LOW (ref 98–107)
Creatinine: 0.54 mg/dL (ref 0.51–0.95)
GFR: >60 mL/min
Glucose: 283 mg/dL — ABNORMAL HIGH (ref 70–115)
Potassium: 3.8 mmol/L (ref 3.5–5.1)
Sodium: 135 mmol/L — ABNORMAL LOW (ref 136–145)
Total Protein: 6.7 g/dL (ref 6.0–8.0)

## 2014-10-05 LAB — GLUCOSE (POCT): Glucose (POCT): 201 mg/dL — ABNORMAL HIGH (ref 70–115)

## 2014-10-05 LAB — PROTHROMBIN TIME, BLOOD
INR: 1
PT,Patient: 10.8 s (ref 9.7–12.5)

## 2014-10-05 LAB — APTT, BLOOD: PTT: 28.7 s (ref 25.0–34.0)

## 2014-10-05 MED ORDER — HYDROMORPHONE HCL 1 MG/ML IJ SOLN
0.50 mg | Freq: Once | INTRAMUSCULAR | Status: AC
Start: 2014-10-05 — End: 2014-10-05
  Administered 2014-10-05 (×2): 0.5 mg via INTRAVENOUS
  Filled 2014-10-05: qty 0.5

## 2014-10-05 MED ORDER — METFORMIN HCL 850 MG OR TABS
850.0000 mg | ORAL_TABLET | Freq: Once | ORAL | Status: DC
Start: 2014-10-05 — End: 2014-10-05
  Filled 2014-10-05: qty 1

## 2014-10-05 NOTE — ED MD Progress Note (Signed)
Patient feels better. Requests to go home.  She and husband agree that nuerologically she is at baseline. HA has resolved.  CT shows old lacunar infarct -- on [facility] records from last week.   IMP headache, not new stroke, probably musculoskeletal  PLAN will discharge home, f/u dystonia clinic, Cardiology

## 2014-10-05 NOTE — ED Notes (Addendum)
PT TO CT

## 2014-10-05 NOTE — ED Notes (Signed)
Pt reports h/a pain now 2/10, tolerable and "better than I've felt in days"  VSS

## 2014-10-05 NOTE — ED EKG Interpretation (Signed)
ED EKG Interpretation    EKG: NSR at rate of 85, axis 2, no st t wave deviation, normal intervals

## 2014-10-05 NOTE — ED Notes (Signed)
12-Lead EKG done and handed to MD.Tomaszewski copy in chart

## 2014-10-05 NOTE — ED Notes (Signed)
Pt d/c home with self via wheelchair with husband. A&O x 4 at time of d/c.   Patient reports improvement. Vital signs stable at time of d/c.   All verbal and written d/c instructions given to patient   Pt given referrals to cardiology.  Pt. Instructed to follow-up with PCP and return to ER if symptoms progress or worsen.   Pt demonstrates and verbalizes understanding of instructions given.

## 2014-10-05 NOTE — ED Notes (Signed)
Blood samples collected/labeled and sent to the lab.

## 2014-10-05 NOTE — ED Provider Notes (Signed)
Scribed for [employer] A* by Gery Pray McKinzie.    10/05/2014  4:42 PM    Chief Complaint:   Chief Complaint   Patient presents with    Headache- New Onset Or New Symptoms     x2 days "behind eyes" hx stroke 1 month ago. 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 HA started Saturday 10/02/14, but has been progressively worsening since Monday 10/04/14. She goes to pain clinic and takes percocet for chronic pain, which has not relieved HA.  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 She has R-sided facial drooping since the stroke, that persists until now. Pt admits R-sided numbness and weakness that is not new since the stroke. Pt denies n/v/d, f/ch, abd pain, leg swelling, cp, sob. She states diabetes has not been well-controlled and she recently started insulin. Pt also takes Soma for dystonia.    Past Medical History   Diagnosis Date    HTN (hypertension)     Diabetes     Dysarthria     Cervical spine tumor      No past surgical history on file.  History     Social History    Marital Status: Married     Spouse Name: N/A     Number of Children: N/A    Years of Education: N/A     Occupational History    Not on file.     Social History Main Topics    Smoking status: Never Smoker     Smokeless tobacco: Never Used    Alcohol Use: No    Drug Use: Not on file    Sexual Activity: Not on file     Other Topics Concern    Not on file     Social History Narrative     Family History   Problem Relation Age of Onset    Cancer Mother     HEART DISEAS Mother     Cancer Father     HEART DISEAS Sister        Medication Review Moment  Prior to Admission Medications   Prescriptions Last Dose Informant Patient Reported? Taking?    GLYBURIDE PO   Yes No   Sig: 2 times daily.   Levothyroxine Sodium (SYNTHROID PO)   Yes No   Sig: daily.   SitaGLIPtin Phosphate (JANUVIA PO)   Yes No   Sig: daily.   carBAMazepine (TEGRETOL) 200 MG tablet   No No   Sig: Take 2 tablets by mouth 2 times daily.   carisoprodol (SOMA) 350 MG tablet   Yes No   Sig: Take 350 mg by mouth. 2-4 x day   fentaNYL (DURAGESIC) 50 MCG/HR patch   Yes No   Sig: Apply 1 patch topically every 72 hours.   lansoprazole (PREVACID) 30 MG capsule   No No   Sig: Take 1 capsule (30 mg) by mouth daily.   metFORMIN (GLUCOPHAGE) 850 MG tablet   Yes No   Sig: Take 850 mg by mouth 3 times daily.   oxycodone-acetaminophen (PERCOCET) 10-325 MG per tablet   Yes No   Sig: Take 1 tablet by mouth every 6 hours as needed for Severe Pain (Pain Score  7-10).   rimexolone (VEXOL) 1 % ophthalmic suspension   Yes No   Sig: Place 1 drop into both eyes 4 times daily.   timolol (BETIMOL) 0.25 % ophthalmic solution   Yes No   Sig: Place 1-2 drops into both eyes daily. use in affected eye(s)      Facility-Administered Medications: None       Review of Systems - Constitutional: negative for: fatigue, fever.  Eyes: negative for:  blurry vision, photophobia.  Ears, Nose, Mouth, Throat: negative for:  difficulty swallowing, sore throat, nosebleeds.  CV: and negative for chest pain, palpitations.  Resp: negative for:  cough, shortness of breath.  GI: negative for: vomiting, nausea, abdominal pain, diarrhea.  GU: negative for: dysuria, hematuria.  Musculoskeletal: negative for: back pain, neck pain.  Integumentary: negative for: rash, bruising.  Neuro: Positive for headaches, R-sided numbness and weakness (not new). negative for: seizures, vertigo.  Heme/Lymphatic: negative for: anemia, bleeding disorder, swollen nodes.  Allergy/Immun: negative for: hay fever, itchy eyes.    PE: Vital Signs noted from Triage Page.   4    10/05/14  1634   BP: 150/82   Pulse: 82   Temp: 97.9 F (36.6 C)   Resp: 18   SpO2: 98%    of  note hypetensive  PT is a WDWN female in NAD. the patient is A&O*3.   HEENT exam shows: NCAT, Eyes PERRL EOMI. Mouth MMM.   Neck exam is non-tender. No lymphadenopathy. No Thyromegaly. No JVD.   Chest exam is clear to auscultation bilaterally.   Cardiac exam shows regular rate and rhythm.  No murmurs auscultated.   Abdominal exam has bowel sounds present and soft. The abdomen is non-tender. No masses or rebound can be palpated.  Back exam has no costovertebral angle tenderness. No spinal tenderness to palpation.   Extremity exam shows 2/2 pulses.  Skin exam shows no lesions or rashes.  Neuro exam shows CN 3-12 grossly intact except mild dysarthria (chronic) and old R facial droop, good strength and sensation bilat except right arm 5-/5, no pronator drift, good past pointing bilat.  DTRs +1 bilat, slow unsteady gait -- baseline      The patient's laboratory results are No results found for this or any previous visit.     Impression: headache for four days intermittent    Clinical Decision Making: could be bleed in CVA, new CVA but no clinical signs or tension, musculoskeletal headache from dystonia and neck     Plan: pain meds, CT head and reassess    This encounter was scribed for Brently Voorhis A* by Gery Pray McKinzie. The final note was reviewed and edited by Shanon Payor A*    10/05/2014  4:50 PM            [name], Lucien Mons, MD  10/05/14 2033

## 2014-10-05 NOTE — ED Notes (Signed)
Received pt care report from Fredderick Erb, RN  Introduced self as primary RN  CC: H/a  Chief Complaint   Patient presents with    Headache- New Onset Or New Symptoms     x2 days "behind eyes" hx stroke 1 month ago. 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

## 2014-10-05 NOTE — Discharge Instructions (Signed)
Headache, Tension     You have been diagnosed with a tension headache.     Tension headaches are the most common type of headache. The pain can radiate from the neck, eyes or scalp. The pain is often described as a constant pressure or tightness. It usually causes pain on both sides of the head, not just one side. These headaches can last from a few hours to a few days. Some things that cause tension headaches are stress, not enough sleep and poor posture. They can also be caused by not eating regular meals, eyestrain and caffeine withdrawal.     Tension headaches are treated with medication to reduce pain. Generally over-the-counter pain medications, like ibuprofen (Advil® or Motrin®) or acetaminophen (Tylenol®) are effective in helpful in treating tension headaches.     Headache is a very common complaint. Most headaches are not dangerous. Your symptoms today sound like a tension headache. It is OK for you to go home. It is important, however, to follow up with your doctor or neurologist.      Take your medication as directed. This is especially important if your doctor has placed you on a daily medication to prevent headaches.     YOU SHOULD SEEK MEDICAL ATTENTION IMMEDIATELY, EITHER HERE OR AT THE NEAREST EMERGENCY DEPARTMENT, IF ANY OF THE FOLLOWING OCCURS:  · Your headache gets worse or does not improve with medication.  · You have head pain that is different from your normal headache.  · You have a very severe headache that begins suddenly (like an explosion in your head or like a thunderclap).  · You have fever (temperature higher than 100.4ºF / 38ºC).  · You have loss of feeling or tingling in your arms or legs.  · You pass out.  · You develop vision problems.  · You vomit and cannot take medication or keep medication down.

## 2014-10-07 LAB — ECG 12-LEAD
ATRIAL RATE: 85 {beats}/min
ECG INTERPRETATION: NORMAL
P AXIS: 45 degrees
PR INTERVAL: 134 ms
QRS INTERVAL/DURATION: 78 ms
QT: 372 ms
QTC INTERVAL: 442 ms
R AXIS: 2 degrees
T AXIS: 57 degrees
VENTRICULAR RATE: 85 {beats}/min

## 2014-10-11 NOTE — ED Follow-up Note (Signed)
Follow-up type: Callback       Routine ED Patient Call Back    Patient unable to be contacted, no message left

## 2014-10-26 ENCOUNTER — Encounter (INDEPENDENT_AMBULATORY_CARE_PROVIDER_SITE_OTHER): Payer: Medicaid Other | Admitting: Gastroenterology

## 2014-10-27 ENCOUNTER — Ambulatory Visit (HOSPITAL_BASED_OUTPATIENT_CLINIC_OR_DEPARTMENT_OTHER): Payer: Medicaid Other | Admitting: Vascular Neurology

## 2014-11-09 ENCOUNTER — Encounter: Payer: Self-pay | Admitting: Hospital

## 2014-11-09 ENCOUNTER — Ambulatory Visit (INDEPENDENT_AMBULATORY_CARE_PROVIDER_SITE_OTHER): Payer: Medicaid Other | Admitting: Neurology

## 2014-11-09 VITALS — BP 141/78 | HR 87 | Temp 98.3°F | Wt 179.0 lb

## 2014-11-09 DIAGNOSIS — M436 Torticollis: Secondary | ICD-10-CM

## 2014-11-09 DIAGNOSIS — I6381 Other cerebral infarction due to occlusion or stenosis of small artery: Secondary | ICD-10-CM

## 2014-11-09 DIAGNOSIS — I639 Cerebral infarction, unspecified: Secondary | ICD-10-CM

## 2014-11-09 DIAGNOSIS — M6289 Other specified disorders of muscle: Secondary | ICD-10-CM

## 2014-11-09 DIAGNOSIS — R259 Unspecified abnormal involuntary movements: Secondary | ICD-10-CM

## 2014-11-09 DIAGNOSIS — G40409 Other generalized epilepsy and epileptic syndromes, not intractable, without status epilepticus: Principal | ICD-10-CM

## 2014-11-09 MED ORDER — CLONAZEPAM 0.5 MG OR TABS
0.5000 mg | ORAL_TABLET | Freq: Two times a day (BID) | ORAL | Status: DC
Start: 2014-11-09 — End: 2015-02-18

## 2014-11-09 NOTE — Progress Notes (Signed)
---------------------(data below generated by Olen Cordial)------------------------    NEUROLOGY NOTE         Demographics:  Date: November 09, 2014   Patient Name: Kimberly Rangel   Medical Record #: [id number]   DOB: 1951/08/04  Insurance:#1: Payor: [employer] / Plan: [place] / Product Type: Medicaid Managed Care /   Age: 64 year old  Sex: female    Clinic Location:      [facility] [address]  [place] [address]    Evaluator(s):   ZONIA CAPLIN was evaluated by: Neurology Attending    Teaching Statement:    A relevant history (including allergies, medications, past medical history, relevant review of systems) and relevant physical exam as performed by Olen Cordial, are as transcribed in this and/or the accompanying note.     Chief Complaint   Patient presents with    Abnormal Movements     History of Present Illness:     Kimberly Rangel is a 64 year old female who is here for evaluation of the above chief complaint.  LV 08-21-13 Initial visit: <-Assessment->: 64yo relatively poor historian referred to me by Dr. 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 The torticollis has been present since 1991 by the patient's report after having a neck mass removed. The patient's history of onset after surgery and the patient's exam findings of persistent head tilt without hypertrophy of any neck muscles, inability to passively or actively hold her neck in a neutral position for any significant period of time, and lack of any sensory tricks support an acquired, as a sequelae of her past neck surgery, rather than an idiopathic cause. Therefore, unlikely to benefit from botulinum toxin injections, i.e. There are "no" obvious muscles causing her head tilt. Furthermore, she is not bothered by her head tilt.    She is much more concerned about her recent stroke with her recent Brain MRI  showing an acute stroke, as she should be. The patient's vague R sided purely sensory symptoms would fit with the apparent new L thalamic stroke. Given the location as a "small vessel" event and negative work up by her report done at [facility], she would normally be started on an antiplatelet agent, but she cannot as she has gastritis. She does not have A fib to warrant anticoagulation. Given the complex situation, the patient is in, I have recommended Stroke clinic referral to better decide the risks vs. Benefits of best stroke prophylaxis medications if any for this patient.  <-Plan->: -Continue current medications unchanged. -Schedule visit with Stroke clinic at [facility] next available. -RTC if needed if decide to do Botox in the future.    Interval Hx:  09/01/14: Patient called in stating " I can't wait until Feb to be seen i am in a lot of pain. I am having trouble with words. Things don't feel right. I have been waiting four months to be seen! Is there anyone else that can see me sooner?? I liked Dr Nicki Guadalajara but i will see anybody who treats for Dystonia." Patient was very stressed out and crying. Please advise.     Pt reports that on Nov 22nd 2015: She had a "stroke"; when she couldn't hold things, and was weak bilaterally R>L; and was hospitalized at [facility] then had Rehab and then had seizure. Then she reports being put under sedation  x2days; and is Now on Keppra 2000mg /day (1000mg  bid). (no med records provided).  Since then, she c/o More jerking and harder to talk; she reports that it is harder to talk and swallow; and she c/o her R eyelid closes by self, with light making worse;  She also c/o Neck tightness more pain 8/10 constantly most of time;  She c/o being stiff all over her body; and feels clumsy all the time x2wks then better x2wks alternating; She reports having similar symptoms when was 64yo with asthma; and was sick a lot; and was treated with gamma globulin shots;     For  Trigeminal Neuralgia; She is also on CBZ;  DM+ x27yrs; no one in family with DM;       Allergies   Allergen Reactions    Bactrim [Sulfamethoxazole W-Trimethoprim] Rash and Unspecified    Erythromycin Swelling and Unspecified    Fruit Extracts Anaphylaxis     Melons    Keflet [Cephalexin] Swelling    Penicillins Swelling    Gluten Meal Other     Rye, barley, soy, peanut, oatmeal, peas    Ibuprofen Diarrhea     STOMACH PAINS  bleeding    Ultram [Tramadol] Unspecified     Current Outpatient Prescriptions   Medication Sig    carBAMazepine (TEGRETOL) 200 MG tablet Take 2 tablets by mouth 2 times daily.    carisoprodol (SOMA) 350 MG tablet Take 350 mg by mouth. 2-4 x day    fentaNYL (DURAGESIC) 50 MCG/HR patch Apply 1 patch topically every 72 hours.    GLYBURIDE PO 2 times daily.    lansoprazole (PREVACID) 30 MG capsule Take 1 capsule (30 mg) by mouth daily.    Levothyroxine Sodium (SYNTHROID PO) daily.    metFORMIN (GLUCOPHAGE) 850 MG tablet Take 850 mg by mouth 3 times daily.    oxycodone-acetaminophen (PERCOCET) 10-325 MG per tablet Take 1 tablet by mouth every 6 hours as needed for Severe Pain (Pain Score 7-10).    rimexolone (VEXOL) 1 % ophthalmic suspension Place 1 drop into both eyes 4 times daily.    SitaGLIPtin Phosphate (JANUVIA PO) daily.    timolol (BETIMOL) 0.25 % ophthalmic solution Place 1-2 drops into both eyes daily. use in affected eye(s)     No current facility-administered medications for this visit.     Past Medical History   Diagnosis Date    HTN (hypertension)     Diabetes     Dysarthria     Cervical spine tumor        No past surgical history on file.     Exam:   On Exam today, I find:   Vital signs: Recorded in chart, and reviewed by me today.  Filed Vitals:    11/09/14 1650 11/09/14 1652   BP: 153/85 141/78   Pulse: 80 87   Temp: 98.3 F (36.8 C)    TempSrc: Oral    Weight: 81.194 kg (179 lb)        <General Exam> <General Exam Elements (Normal)>:  General: Patient is well  developed, & in no acute distress.  <Neurologic Exam>   Neurologic Exam: HIF: Oriented to person/ place/ time. Intact recent & remote memory. Appropriate mood & affect. Normal attention span & concentration. Normal language functions (naming, repetition, comprehension, spontaneous speech). Normal fund of knowledge. No neglect or inattention. Speech normal.  CN: Cranial Nerves II-XII intact throughout: Cranial nerve II: Normal visual fields. PERRLA. Cranial nerve III,IV,VI: Extra-ocular movements intact. Cranial nerve V: Normal  facial sensation. Cranial nerve VII: Normal facial symmetry. Cranial nerve VIII: Normal hearing bilaterally. Cranial nerve IX,X: Palate elevates symmetrically. Cranial nerve XI: Normal shoulder shrug bilaterally. Cranial nerve XII: Tongue midline without deviation.   Motor: Normal bulk and tone in all 4 extremities. 5/5 strength in all muscle groups x 4 extremities.   RUE: 5/5 Deltoid, 5/5 Triceps, 5/5 Biceps, 5/5 BrachioRadialis, 5/5 Intrinsics. LUE: 5/5 Deltoid, 5/5 Triceps, 5/5 Biceps, 5/5 BrachioRadialis, 5/5 Intrinsics. RLE: 5/5 IlioPsoas, 5/5 KneeFlexors, 5/5 KneeExtensors, 5/5 AnkleFlexors, 5/5 AnkleExtensors. LLE: 5/5 IlioPsoas, 5/5 KneeFlexors, 5/5 KneeExtensors, 5/5 AnkleFlexors, 5/5 AnkleExtensors.   Sensory: Subjective increased sensation on R hands and feet compared to L, but patchy and inconsistent; otherwise grossly Intact Touch, Position, Vibration in all 4 extremities.   DTRs: Deep Tendon Reflexes symmetric in all 4 extremities: (R) Bi/ Tri/ BR/ Pat/ Ach = 2 1 1 2 1  (L) Bi/ Tri/ BR/ Pat/ Ach = 2 1 1 2  1. Toes downgoing bilaterally. No clonus.  Cereb: Normal rapid alternating movements bilaterally. No ataxia on finger to nose test.   Gait: Normal based gait with normal arm swing & good turns.    Torticollis: None0 (normal), Laterocollis: Right1, Anterocollis and Retrocollis not present.: 0, Lateral shift: None, Sagittal shift: none, Duration: 0, Sensory Tricks: no, Shoulder  Elevation: 0, Range of Motion: 2 (abn, mild) laterally and Neutral Position Time: 3 (abn, mod)      Labs/Imaging/ Studies:       RELEVANT RESULTS/ENCOUNTERS (in visit navigator) were reviewed: Yes  Radiographic film images were personally reviewed by me: Yes  Outside medical records were personally reviewed by me: N/A      Impression/ Plan:       -----------------------  <-Assessment->: 64yo F relatively poor historian originally referred to me 08/2013 by Dr. 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 The torticollis was reported as present since 1991 after having a neck mass removed. The patient's history of onset after surgery and the patient's exam findings of persistent head tilt without hypertrophy of any neck muscles, inability to passively or actively hold her neck in a neutral position for any significant period of time, and lack of any sensory tricks support an acquired, as a sequelae of her past neck surgery, rather than an idiopathic cause. Therefore, my assessment was that she was unlikely to benefit from , botulinum toxin injections, i.e. There are "no" obvious muscles causing her head tilt, especially as she is not bothered by her head tilt. She now returns with a rather bizarre history of having had another stroke and having had a "seizure", and now c/o "stiffness" all over her body but especially her neck and wanting to be evaluated for "Stiff Person Syndrome" (SPS). While the likelihood of SPS is small, the reported seizure and the patient's DM both associated with SPS, indicates w/u to r/o SPS is justified. More than 50% of this 40 min visit in face to face contact with patient was spent on counseling regarding the unlikelihood of SPS, but that w/u for SPS and epilepsy would be done just just in case.    <-Diagnoses & Orders (this encounter)->:     ICD-10-CM ICD-9-CM    1. Other generalized epilepsy, not intractable, without status epilepticus ([place]) G40.409 345.90 clonazePAM  (KLONOPIN) 0.5 MG tablet      GLUTAMIC ACID DECARBOXYL, BLOOD      EEG   2. Muscle stiffness M62.89 728.9 clonazePAM (KLONOPIN) 0.5 MG tablet      GLUTAMIC ACID DECARBOXYL, BLOOD  EEG      EMG/NCS SERVICE REQUEST - NEUROLOGY      EPG, SERUM      SED RATE, BLOOD      C-REACTIVE PROTEIN, BLOOD      ANA (ANTI-NUCLEAR AB), BLOOD   3. Torticollis, acquired M43.6 723.5    4. Stroke, lacunar I63.9 434.91    5. Abnormal involuntary movement R25.9 781.0 clonazePAM (KLONOPIN) 0.5 MG tablet      GLUTAMIC ACID DECARBOXYL, BLOOD      EEG      EMG/NCS SERVICE REQUEST - NEUROLOGY      EPG, SERUM      SED RATE, BLOOD      C-REACTIVE PROTEIN, BLOOD      ANA (ANTI-NUCLEAR AB), BLOOD     <-Plan->: 1) schedule EEG, and EMG;  2) get blood work done at [facility].  3) Start clonazepam 0.5mg  tab 1tab at bedtime; then after 3days day 1/2tab in am. Then if not too sleepy, than can increase to 1tab in am.  4) -Continue current medications unchanged.    Note Author: Olen Cordial

## 2014-11-09 NOTE — Patient Instructions (Signed)
1) schedule EEG, and EMG;  2) get blood work done at [facility].  3) Start clonazepam 0.5mg  tab 1tab at bedtime; then after 3days day 1/2tab in am. Then if not too sleepy, than can increase to 1tab in am.

## 2014-11-10 ENCOUNTER — Encounter (INDEPENDENT_AMBULATORY_CARE_PROVIDER_SITE_OTHER): Payer: Self-pay | Admitting: Neurology

## 2014-11-10 MED ORDER — LEVETIRACETAM 1000 MG OR TABS: 1000.00 mg | ORAL_TABLET | ORAL | Status: AC

## 2014-11-10 MED ORDER — ATORVASTATIN CALCIUM 40 MG OR TABS: 40.00 mg | ORAL_TABLET | Freq: Every day | ORAL | Status: AC

## 2014-11-17 ENCOUNTER — Encounter (HOSPITAL_BASED_OUTPATIENT_CLINIC_OR_DEPARTMENT_OTHER): Payer: Self-pay | Admitting: Vascular Neurology

## 2014-11-17 ENCOUNTER — Ambulatory Visit: Payer: Medicaid Other | Attending: Vascular Neurology | Admitting: Vascular Neurology

## 2014-11-17 ENCOUNTER — Other Ambulatory Visit: Payer: Medicaid Other | Attending: Neurology

## 2014-11-17 VITALS — BP 153/89 | HR 76 | Temp 98.4°F | Resp 16 | Ht 64.0 in | Wt 179.0 lb

## 2014-11-17 DIAGNOSIS — R259 Unspecified abnormal involuntary movements: Secondary | ICD-10-CM | POA: Insufficient documentation

## 2014-11-17 DIAGNOSIS — G40409 Other generalized epilepsy and epileptic syndromes, not intractable, without status epilepticus: Principal | ICD-10-CM | POA: Insufficient documentation

## 2014-11-17 DIAGNOSIS — I639 Cerebral infarction, unspecified: Principal | ICD-10-CM | POA: Insufficient documentation

## 2014-11-17 DIAGNOSIS — M6289 Other specified disorders of muscle: Secondary | ICD-10-CM | POA: Insufficient documentation

## 2014-11-17 DIAGNOSIS — R569 Unspecified convulsions: Secondary | ICD-10-CM | POA: Insufficient documentation

## 2014-11-17 LAB — C-REACTIVE PROTEIN, BLOOD: CRP: 0.3 mg/dL (ref <0.5)

## 2014-11-17 LAB — SED RATE, BLOOD: Sed Rate: 13 mm/hr (ref 0–30)

## 2014-11-17 NOTE — Patient Instructions (Signed)
Begin baby aspirin (coated) daily  Follow up with cardiology to get TEE  Return To Clinic if cardiologist says shows any blood clots.  Follow up with your primary care doctor either way.

## 2014-11-17 NOTE — Progress Notes (Signed)
---------------------(data below generated by Shela Commons)------------------------    NEUROLOGY NOTE  Demographics:  Date: November 17, 2014   Patient Name: Kimberly Rangel   Medical Record #: [id number]   DOB: [birth year]  Age: 64 year old  Sex: female  Requested by:      Olen Cordial  Clinic Location:      [facility]  [address]  [place] [address]  Evaluator(s):               Sharalyn Ink was evaluated by: Neurology Attending  Teaching Statement:    A Relevant History and appropriate physical exam as performed by Wilfred Lacy C. Bjorn Loser, MD, are as transcribed in this and/or the accompanying note. Please also see the patient questionnaire for details.    ----------    Chief Complaint   Patient presents with    Stroke      History of Present Illness:     Kimberly Rangel is a 64 year old female who is here for evaluation of the above chief complaint:  Patient has hx of torticollis for which she follows with Dr Nicki Guadalajara and Dr Jerl Santos.    She also has a hx of thalamic stroke for which she is refered here  Baseline sxs of right sided numbness of face, arm and leg.  MRI + for left thalamic lacune   Carotid US (-)    In November, 2015-  Patient was seen at [facility] for possible stroke.  Records reviewed show possible left frontal stroke (though this is in a progress note and not a review of an image or imaging report)  Within days of this evaluation (while inpatient) she had a seizure.  Dr George Hugh evaluated her and placed her on [place] for post-stroke seizure.    She is still awaiting TEE for complete evaluation.  SHe has hx of gastritis and wants to speak with me before starting aspirin      Review of Systems:   No fevers, chills, nausea or vomiting.  No chest pain or shortness of breath.       Allergies:   Allergies   Allergen Reactions    Bactrim [Sulfamethoxazole W-Trimethoprim] Rash and Unspecified    Erythromycin Swelling and Unspecified    Fruit  Extracts Anaphylaxis     Melons    Keflet [Cephalexin] Swelling    Penicillins Swelling    Contrast Dye [Contrast Media] Unspecified    Gluten Meal Other     Rye, barley, soy, peanut, oatmeal, peas    Ibuprofen Diarrhea     STOMACH PAINS  bleeding    Ultram [Tramadol] Unspecified       Current Outpatient Prescriptions   Medication Sig    atorvastatin (LIPITOR) 40 MG tablet Take 40 mg by mouth daily.    carBAMazepine (TEGRETOL) 200 MG tablet Take 2 tablets by mouth 2 times daily.    carisoprodol (SOMA) 350 MG tablet Take 350 mg by mouth. 2-4 x day    clonazePAM (KLONOPIN) 0.5 MG tablet Take 1 tablet (0.5 mg) by mouth 2 times daily.    fentaNYL (DURAGESIC) 50 MCG/HR patch Apply 1 patch topically every 72 hours.    GLYBURIDE PO 2 times daily.    lansoprazole (PREVACID) 30 MG capsule Take 1 capsule (30 mg) by mouth daily.    levETIRAcetam (KEPPRA) 1000 MG tablet Take 1,000 mg by mouth.    Levothyroxine Sodium (SYNTHROID PO) daily.  metFORMIN (GLUCOPHAGE) 850 MG tablet Take 850 mg by mouth 3 times daily.    oxycodone-acetaminophen (PERCOCET) 10-325 MG per tablet Take 1 tablet by mouth every 6 hours as needed for Severe Pain (Pain Score 7-10).    rimexolone (VEXOL) 1 % ophthalmic suspension Place 1 drop into both eyes 4 times daily.    SitaGLIPtin Phosphate (JANUVIA PO) daily.    timolol (BETIMOL) 0.25 % ophthalmic solution Place 1-2 drops into both eyes daily. use in affected eye(s)     No current facility-administered medications for this visit.     Past Medical History   Diagnosis Date    HTN (hypertension)     Diabetes ([place])     Dysarthria     Cervical spine tumor        No past surgical history on file.    Family History:    Family History   Problem Relation Age of Onset    Cancer Mother     HEART DISEAS Mother     Cancer Father     HEART DISEAS Sister        History     Social History    Marital Status: Married     Spouse Name: N/A    Number of Children: N/A    Years of Education:  N/A     Social History Main Topics    Smoking status: Never Smoker     Smokeless tobacco: Never Used    Alcohol Use: No    Drug Use: Not on file    Sexual Activity: Not on file     Other Topics Concern    Not on file     Social History Narrative   Other Social History (in addition to above):   Lives in (city or state): [place] [address]  Companion (with patient): husband    Review of Systems: Please see patient questionnaire for further details.  No fever/ chills/ nausea/ vomiting. No chest pain/ shortness of breath.    Exam:   On Exam today, I find:   Vital signs: Recorded in chart, and reviewed by me today.  BP 153/89 mmHg   Pulse 76   Temp(Src) 98.4 F (36.9 C) (Oral)   Resp 16   Ht 5\' 4"  (1.626 m)   Wt 81.194 kg (179 lb)   BMI 30.71 kg/m2    <General Exam>  General: Patient is well developed, & in no acute distress.   HEENT: Normocephalic & atraumatic.   Visual fields intact bilaterally. PERRLA. EOMI.   CV: Regular rate & rhythm.    Extremities: No clubbing, cyanosis, or edema.    Pulses: Equal throughout.    <Neurologic Exam>  Neurologic Exam: HIF: Oriented to person/ place/ time. Intact recent & remote memory. Appropriate mood & affect. Normal attention span & concentration. Normal language functions (naming, repetition, comprehension, spontaneous speech). Normal fund of knowledge. No neglect or inattention.   CN: Cranial Nerves II-XII intact throughout: except for perhaps abnl sensation right face and dysarthria (also perhaps related to dentition)      Labs/Imaging/ Studies:       RELEVANT RESULTS/ENCOUNTERS (in visit navigator) were reviewed: Yes  Radiographic film images were personally reviewed by me: No  Outside medical records were personally reviewed by me: Yes- as above in HPI    Other Imaging Results:   Carotid US (-) 10/14  MRI + left thalamic cva (prior review)    Impression/ Plan:   <-Assessment->:  64 year old female with hx of thalamic stroke, now recent possible  left frontal stroke (as per progress note, but not validated) and post stroke seizure.    Either the event in Novemebr was a stroke then seizure, or simply a seizure is unclear.  She is on AED now.    Needs completion of stroke workup with TEE (she is scheduled to see cardiology)        <-Plan->:     Speak with primary care MD about stroke risk factors:  Please bring this to Dr Danise Mina   Goal LDL <90   Goal A1c < 6.0    Patient to speak with her pain Specialist (Dr Albertine Patricia) about possible other medications for thalamic pain such as Gabapentin.  Will defer this to him and patient to decide    Begin baby aspirin (coated) daily: I have considered her hx of gastritis. She does not have an allergy to asa. I think low dose with coated aspirin benefits outweigh risks. She will stop this if GI distress or blood per rectum (or dark stools).. However, protecting brain outweighs potential risks.  Follow up with cardiology to get TEE  Return To Clinic if cardiologist says shows any blood clots.  Follow up with your primary care doctor either way.        Note Author: Shela Commons

## 2014-11-18 LAB — EPG, SERUM
A/G Ratio, EPG: 1.04
Albumin, EPG: 3.41 gm/dL (ref 3.20–5.00)
Alpha 1, EPG: 0.25 gm/dL (ref 0.10–0.40)
Alpha 2, EPG: 0.7 gm/dL (ref 0.60–1.10)
Beta, EPG: 1.2 gm/dL (ref 0.60–1.30)
Gamma, EPG: 1.14 gm/dL (ref 0.70–1.50)
Total Protein, EPG: 6.7 gm/dL (ref 6.00–8.00)

## 2014-11-18 LAB — EPG, INTERPRETATION, SERUM

## 2014-11-21 LAB — GLUTAMIC ACID DECARBOXYL, BLOOD: GAD-65 Antibody: <5 IU/mL (ref 0.0–5.0)

## 2014-11-23 LAB — ANA (ANTI-NUCLEAR AB), BLOOD: ANA (Anti-Nuclear Ab): NEGATIVE

## 2014-11-25 ENCOUNTER — Ambulatory Visit
Admission: RE | Admit: 2014-11-25 | Discharge: 2014-11-25 | Disposition: A | Discharge status: Home / Self Care | Payer: Medicaid Other | Source: Ambulatory Visit | Attending: Neurology | Admitting: Neurology

## 2014-11-25 ENCOUNTER — Telehealth (INDEPENDENT_AMBULATORY_CARE_PROVIDER_SITE_OTHER): Payer: Self-pay | Admitting: Neurology

## 2014-11-25 DIAGNOSIS — M6289 Other specified disorders of muscle: Secondary | ICD-10-CM | POA: Insufficient documentation

## 2014-11-25 DIAGNOSIS — R259 Unspecified abnormal involuntary movements: Secondary | ICD-10-CM | POA: Insufficient documentation

## 2014-11-25 DIAGNOSIS — G40409 Other generalized epilepsy and epileptic syndromes, not intractable, without status epilepticus: Principal | ICD-10-CM | POA: Insufficient documentation

## 2014-11-25 NOTE — Telephone Encounter (Signed)
Pt is calling because she is having an EEG done today and is wondering if her medications would interfere with her EEG. She took her medication last night and wanted to know if she needed to continue taking her medication. Please contact and advise.

## 2014-11-26 NOTE — Telephone Encounter (Signed)
Please let patient know that she should still go ahead and have the EEG as scheduled.  She can hold off today's seizure meds until after the EEG if possible especially the clonazepam, but having the EEG ASAP is important.  Another one can always be scheduled if needed.  Please let patient know.

## 2014-11-26 NOTE — Procedures (Signed)
REPORT TYPE: Outpatient Standard EEG    EEG#  [id number]    TECH NAME: RN    Dictating Physician: Maryan Char, M.D.    Requesting Provider: Olen Cordial, M.D.    Study Date: 11/25/2014    START TIME: 40      STOP TIME: 1400      EQUIPMENT TYPE: XLTEK    Indication for Referral: Seizure    Clinical History/Diagnosis: Left thalamic stroke    Medications:  Klonopin, Keppra, Percocet    Sedation: NA  Medication: NA  Dosage: NA    Time Since Last Seizure:  October, 2015    Amount of Sleep in Last 24 Hours:  9 hours    Last Meal: 0730 hours    COMMENT:  This outpatient standard EEG is performed on a 64 year old woman  with history of left thalamic stroke and seizure and with torticollis.  She  is currently receiving Klonopin, Percocet and Keppra.    REPORT:  During quiet wakefulness, medium amplitude, fairly persistent 8-9  Hz potentials are noted with a symmetric distribution which are maximal in  the posterior head region.  They are attenuated by eye opening.  Frequent  eye blink artifact contaminates the tracing.  Occasional theta and delta  potentials are observed in the bitemporal head regions.  Drowsiness is  associated with attenuation of the posterior dominant rhythm, the  appearance of slow rolling eye movements and the appearance of central  theta potentials.  Deeper stages of sleep are not achieved.  Photic  stimulation produces a symmetric driving response at all flash frequencies.  Hyperventilation is not performed given the patient's condition.    IMPRESSION:  This EEG, performed during wakefulness and drowsiness,  demonstrates mild diffuse abnormalities suggestive of a generalized  nonspecific encephalopathy.  Sedating medications such as Klonopin could  contribute to this finding.  No seizures or epileptiform discharges are  observed.    Kimberly Rangel, M.D., Assistant Professor, [place]      Electronically signed by:  Maryan Char, M.D. 12/23/2014 10:36 A            DD:  11/26/2014    DT:  11/26/2014 03:22 P DocNo.:  [id number]  LRK/[id number]      cc:   Olen Cordial, M.D.        Fax Recipient, [phone]        [facility]        [place] [address]

## 2014-11-29 ENCOUNTER — Telehealth (INDEPENDENT_AMBULATORY_CARE_PROVIDER_SITE_OTHER): Payer: Self-pay | Admitting: Neurology

## 2014-11-29 NOTE — Telephone Encounter (Signed)
Request has been submitted:      Thank you,  [facility]  Referral Coordinator  [facility]  Email: [email or web address]

## 2014-11-29 NOTE — Telephone Encounter (Signed)
Pt checking on auth for EMG.  Please advise.

## 2015-01-26 ENCOUNTER — Ambulatory Visit: Payer: Medicaid Other | Attending: Neurology | Admitting: Neurology

## 2015-01-26 DIAGNOSIS — M6289 Other specified disorders of muscle: Principal | ICD-10-CM | POA: Insufficient documentation

## 2015-01-26 DIAGNOSIS — R259 Unspecified abnormal involuntary movements: Secondary | ICD-10-CM | POA: Insufficient documentation

## 2015-01-26 NOTE — Procedures (Addendum)
EMG and Nerve Conduction Studies Report      Name: Kimberly Rangel Gender: Female  MRN: [id number] Date of Birth: [birth year]      Visit Date: 01/26/2015 13:32  Technologist: Kandice Robinsons, CNCT  Height: 5 feet 4 inch  Weight: 175 lbs        Reason for Referral:  SPS    Clinical Note:  See Dr Tenna Delaine note for full details.     Summary:   NCS  Mild slowing in right median sensory response across the wrist with normal DML.  Absent lower limb sensory responses.    EMG  Normal EMG in right lower and upper limbs  Normal EMG in right thoracic paraspinals.   Normal EMG in right trapezius.  Full relaxation achieved in all muscles tested.       Conclusion:   Essentially normal study    1. 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   2. There is no definite evidence to support Stiff person syndrome.  3. There is no evidence to support a large fiber polyneuropathy or myopathy.         _______________________________________    Cyril Loosen, M.D.  Department of Neurology / Clinical Neurophysiology Laboratory    CC--Dr. Referring Physician           [facility]      Nerve / Sites Rec. Site Onset Lat Peak Lat NP Amp Segments Distance Vel. Temp.     ms ms V  mm m/s C   R Median - Ulnar CTS      Median Dig II Wrist 2.8 3.6 7 Median Dig II - Wrist 130 47.1 32.9      Median Palm Wrist 2.0 2.6 13 Median Palm - Wrist 80 39.4       Ulnar Dig V Wrist 2.0 2.9 8 Ulnar Dig V - Wrist 110 55.6       Ulnar Palm Wrist 1.7 2.3 4 Ulnar Palm - Wrist 80 46.5    R Radial - Anatomical snuff box (Forearm)      Forearm Snuff Box 1.6 2.1 15 Forearm - Snuff Box 70 44.8 31.8   R Sural - Ankle (Calf)      Calf Ankle 2.4 3.4 6 Calf - Ankle 140 58.4 32.5   L Sural - Ankle (Calf)      Calf Ankle 2.3 3.0 4 Calf - Ankle 140 61.1    R Superficial peroneal - Ankle      Lat leg Ankle NR NR NR Lat leg - Ankle 140 NR 31       MNC        Nerve / Sites Muscle Latency Amplitude Rel. Amp Duration Area Rel Area  Segments Distance Lat Diff Velocity     ms mV % ms mVms %  mm ms m/s   R Median - APB      Wrist APB 3.8 6.3 100 6.7 23.5 100 Wrist - APB 70        Elbow APB 7.9 6.0 96 7.4 24.5 104 Elbow - Wrist 200 4.1 49.2   R Ulnar - ADM      Wrist ADM 2.7 8.7 100 7.6 22.3 100 Wrist - ADM 70        B.Elbow ADM 6.1 8.0 91.5 7.9 24.2 109 B.Elbow - Wrist 180 3.5 51.6      A.Elbow ADM 8.0 7.1 81.4 8.0 23.5 106 A.Elbow - B.Elbow 100  1.9 53.3   R Peroneal - EDB      Ankle EDB 3.6 0.6 100 6.0 1.8 100 Ankle - EDB 90        Fib head EDB 11.3 0.6 104 6.6 1.9 105 Fib head - Ankle 290 7.7 37.9      Pop fossa EDB 13.1 0.6 111 6.8 2.2 122 Pop fossa - Fib head 85 1.8 46.6   R Tibial - AH      Ankle AH 3.8 5.9 100 5.5 14.9 100 Ankle - AH 80        Pop fossa AH 11.9 2.8 46.3 6.3 7.3 49.3 Pop fossa - Ankle 335 8.2 41.0       EMG      Muscle         R T8 paraspinal 4.1       EMG Summary Table     Spontaneous Activity Voluntary Actvity Comment   Muscle IA Fib PSW Fasc Misc Amp Dur PPP Recruitment Pattern   R. Tibialis anterior Normal None None None None Normal Normal Normal Normal Normal   R. Vastus lateralis Normal None None None None Normal Normal Normal Normal Normal   R. Gastrocnemius (Medial head) Normal None None None None Normal Normal Normal Normal Normal   R. Vastus medialis Normal None None None None Normal Normal Normal Normal Normal   R. Tensor fasciae latae Normal None None None None Normal Normal Normal Normal Normal   R. T8 paraspinal Normal None None None None Normal Normal Normal Normal Normal   R. First dorsal interosseous Normal None None None None Normal Normal Normal Normal Normal   R. Deltoid Normal None None None None Normal Normal Normal Normal Normal   R. Trapezius (upper) Normal None None None None Normal Normal Normal Normal Normal

## 2015-02-18 ENCOUNTER — Ambulatory Visit (INDEPENDENT_AMBULATORY_CARE_PROVIDER_SITE_OTHER): Payer: Medicaid Other | Admitting: Neurology

## 2015-02-18 ENCOUNTER — Encounter (INDEPENDENT_AMBULATORY_CARE_PROVIDER_SITE_OTHER): Payer: Self-pay | Admitting: Neurology

## 2015-02-18 VITALS — BP 135/88 | HR 97 | Temp 98.1°F

## 2015-02-18 DIAGNOSIS — R259 Unspecified abnormal involuntary movements: Secondary | ICD-10-CM

## 2015-02-18 DIAGNOSIS — M542 Cervicalgia: Secondary | ICD-10-CM

## 2015-02-18 DIAGNOSIS — R1310 Dysphagia, unspecified: Secondary | ICD-10-CM

## 2015-02-18 DIAGNOSIS — R52 Pain, unspecified: Secondary | ICD-10-CM

## 2015-02-18 DIAGNOSIS — M6289 Other specified disorders of muscle: Principal | ICD-10-CM

## 2015-02-18 DIAGNOSIS — M436 Torticollis: Secondary | ICD-10-CM

## 2015-02-18 MED ORDER — CLONAZEPAM 0.5 MG OR TABS
ORAL_TABLET | ORAL | Status: AC
Start: 2015-02-18 — End: ?

## 2015-02-18 MED ORDER — CARISOPRODOL 350 MG OR TABS
350.0000 mg | ORAL_TABLET | Freq: Three times a day (TID) | ORAL | Status: AC | PRN
Start: 2015-02-18 — End: ?

## 2015-02-18 NOTE — Patient Instructions (Addendum)
1) Increase clonazepam 0.5mg  1tab 2x/day and 2tabs at bedtime.  2) May restart soma 1tab 3x/day;  3) try to exercise daily at least 12mins/day.

## 2015-02-18 NOTE — Progress Notes (Signed)
---------------------(data below generated by Olen Cordial)------------------------    NEUROLOGY NOTE    [facility]: Follow Up Visit         Demographics:  Date: Feb 18, 2015   Patient Name: Kimberly Rangel   Medical Record #: [id number]   DOB: Dec 06, 1950  Insurance:#1: Payor: [employer] / Plan: [place] / Product Type: Medicaid Managed Care /   Age: 64 year old  Sex: female    Clinic Location:      [facility] [address]  [place] [id number]    Evaluator(s):   Kimberly Rangel was evaluated by: Neurology Attending    Teaching Statement:    A relevant history (including allergies, medications, past medical history, relevant review of systems) and relevant physical exam as performed by Olen Cordial, are as transcribed in this and/or the accompanying note.     Chief Complaint   Patient presents with    Abnormal Movements     History of Present Illness:     Kimberly Rangel is a 64 year old female who is here for evaluation of the above chief complaint.  LV 11-09-14 09/01/14: Patient called in stating " I can't wait until Feb to be seen i am in a lot of pain. I am having trouble with words. Things don't feel right. I have been waiting four months to be seen! Is there anyone else that can see me sooner?? I liked Dr Nicki Guadalajara but i will see anybody who treats for Dystonia." Patient was very stressed out and crying. Please advise.     Pt reports that on Nov 22nd 2015: She had a "stroke"; when she couldn't hold things, and was weak bilaterally R>L; and was hospitalized at [facility] then had Rehab and then had seizure. Then she reports being put under sedation x2days; and is Now on Keppra 2000mg /day (1000mg  bid). (no med records provided).  Since then, she c/o More jerking and harder to talk; she reports that it is harder to talk and swallow; and she c/o her R eyelid closes by self,  with light making worse;  She also c/o Neck tightness more pain 8/10 constantly most of time;  She c/o being stiff all over her body; and feels clumsy all the time x2wks then better x2wks alternating; She reports having similar symptoms when was 64yo with asthma; and was sick a lot; and was treated with gamma globulin shots;     For Trigeminal Neuralgia; She is also on CBZ; DM+ x24yrs; no one in family with DM;   <-Assessment->: 64yo F relatively poor historian originally referred to me 08/2013 by Dr. 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 The torticollis was reported as present since 1991 after having a neck mass removed. The patient's history of onset after surgery and the patient's exam findings of persistent head tilt without hypertrophy of any neck muscles, inability to passively or actively hold her neck in a neutral position for any significant period of time, and lack of any sensory tricks support an acquired, as a sequelae of her past neck surgery, rather than an idiopathic cause. Therefore, my assessment was that she was unlikely to benefit from , botulinum toxin injections, i.e. There are "no" obvious muscles causing her head tilt, especially as she is not bothered by her head tilt. She now returns with a rather bizarre history of  having had another stroke and having had a "seizure", and now c/o "stiffness" all over her body but especially her neck and wanting to be evaluated for "Stiff Person Syndrome" (SPS). While the likelihood of SPS is small, the reported seizure and the patient's DM both associated with SPS, indicates w/u to r/o SPS is justified. More than 50% of this 40 min visit in face to face contact with patient was spent on counseling regarding the unlikelihood of SPS, but that w/u for SPS and epilepsy would be done just just in case.  <-Plan->: 1) schedule EEG, and EMG;  2) get blood work done at [facility].  3) Start clonazepam 0.5mg  tab 1tab at bedtime; then after 3days day  1/2tab in am. Then if not too sleepy, than can increase to 1tab in am.  4) -Continue current medications unchanged.    Interval Hx: Patient arrives with husband; Pt still c/o muscle spasms in LEs; takes clonazepam 0.5mg  1tab 2x/day; helps pain/muscle spas 50%; wears off after 4-6hrs;    Pt c/o chronic daytime Sleepiness and reports chronic insomnia because of pain since neck surgery.  Also c/o pain all over body, specifically: Bilat hips and hands; ankles;  Pt and husband confirm onset of head tilt, nasal speech and dysphagia immediately after neck surgery in 1991.  Also Being treated for oral thrush; was tested HIV-;       Allergies   Allergen Reactions    Bactrim [Sulfamethoxazole W-Trimethoprim] Rash and Unspecified    Erythromycin Swelling and Unspecified    Fruit Extracts Anaphylaxis     Melons    Keflet [Cephalexin] Swelling    Penicillins Swelling    Contrast Dye [Contrast Media] Unspecified    Gluten Meal Other     Rye, barley, soy, peanut, oatmeal, peas    Ibuprofen Diarrhea     STOMACH PAINS  bleeding    Ultram [Tramadol] Unspecified     Current Outpatient Prescriptions   Medication Sig    atorvastatin (LIPITOR) 40 MG tablet Take 40 mg by mouth daily.    carBAMazepine (TEGRETOL) 200 MG tablet Take 2 tablets by mouth 2 times daily.    carisoprodol (SOMA) 350 MG tablet Take 350 mg by mouth. 2-4 x day    clonazePAM (KLONOPIN) 0.5 MG tablet Take 1 tablet (0.5 mg) by mouth 2 times daily.    fentaNYL (DURAGESIC) 50 MCG/HR patch Apply 1 patch topically every 72 hours.    GLYBURIDE PO 2 times daily.    lansoprazole (PREVACID) 30 MG capsule Take 1 capsule (30 mg) by mouth daily.    levETIRAcetam (KEPPRA) 1000 MG tablet Take 1,000 mg by mouth.    Levothyroxine Sodium (SYNTHROID PO) daily.    metFORMIN (GLUCOPHAGE) 850 MG tablet Take 850 mg by mouth 3 times daily.    oxycodone-acetaminophen (PERCOCET) 10-325 MG per tablet Take 1 tablet by mouth every 6 hours as needed for Severe Pain (Pain Score  7-10).    rimexolone (VEXOL) 1 % ophthalmic suspension Place 1 drop into both eyes 4 times daily.    SitaGLIPtin Phosphate (JANUVIA PO) daily.    timolol (BETIMOL) 0.25 % ophthalmic solution Place 1-2 drops into both eyes daily. use in affected eye(s)     No current facility-administered medications for this visit.     Past Medical History   Diagnosis Date    HTN (hypertension)     Diabetes ([place])     Dysarthria     Cervical spine tumor  No past surgical history on file.     Exam:   On Exam today, I find:   Vital signs: Recorded in chart, and reviewed by me today.  Filed Vitals:    02/18/15 1639 02/18/15 1640   BP: 146/95 135/88   Pulse: 94 97   Temp: 98.1 F (36.7 C)    TempSrc: Oral        <General Exam> <General Exam Elements (Normal)>:  General: Patient is well developed, & in no acute distress, but anxious. Does NOT appear to be in pain. No involuntary mvts noted, except for R head tilt.    <Neurologic Exam>   Neurologic Exam: HIF: Oriented to person/ place/ time. Appropriate mood & affect, but mildly anxious. Normal language functions (comprehension, spontaneous speech). Normal fund of knowledge. No neglect or inattention.   Nasal Speech, but o/w normal, no dysarthria.    Torticollis: None0 (normal), Laterocollis: Right 2, Anterocollis and Retrocollis not present.: 0, Lateral shift: None, Sagittal shift: none, Duration: 0, Sensory Tricks: no, Shoulder Elevation: 0, Range of Motion: 2 (abn, mild) laterally and Neutral Position Time: 3 (abn, mod)    CN: Cranial Nerves II-XII intact throughout:   Motor: Normal bulk and tone in all 4 extremities. 5/5 strength in all muscle groups x 4 extremities.   Cereb: Normal rapid alternating movements bilaterally. No ataxia on finger to nose test.   Gait: Normal based gait with normal arm swing & good turns.      Labs/Imaging/ Studies:       RELEVANT RESULTS/ENCOUNTERS (in visit navigator) were reviewed: Yes  Radiographic film images were personally reviewed by  me: Yes  Outside medical records were personally reviewed by me: N/A      Impression/ Plan:       -----------------------  <-Assessment->: 64yo F poor historian originally referred to me 08/2013 by Dr. 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 The torticollis was reported as present since 1991 after having cervical surgery to have a neck mass removed. The patient's history of onset after surgery and the patient's exam findings of persistent head tilt without hypertrophy of any neck muscles, inability to passively or actively hold her neck in a neutral position for any significant period of time, and lack of any sensory tricks support an acquired  torticollis, as a sequelae of her past neck surgery, rather than idiopathic cervical dystonia. Therefore, my assessment was that she was unlikely to benefit from botulinum toxin injections, i.e. There are "no" obvious muscles causing her head tilt, especially as she is not bothered by her head tilt. She returns with a rather bizarre history of having had another stroke and having had a "seizure", and c/o "stiffness" all over her body, but especially her neck and wanting to be evaluated for "Stiff Person Syndrome" (SPS). While the likelihood of SPS is small, the reported seizure and the patient's DM both associated with SPS, indicates w/u to r/o SPS is justified. W/U has been done, and there is no evidence of SPS by EMG and no anti-GAD antibodies. More than 50% of this 45 min visit in face to face contact with patient was spent on counseling regarding NO EVIDENCE OF SPS or NEUROLOGIC CD, using neurologically specific terminology instead of "loose" terminology for "dystonia", need to focus on treatment of "MUSCLE SPASM" to provide pain relief rather than obsessing over unhelpful nomenclature/terminology that have been used to loosely in the past by NON-movement disorders specialists.    <-Diagnoses & Orders (this encounter)->:  ICD-10-CM ICD-9-CM    1.  Muscle stiffness M62.89 728.9 clonazePAM (KLONOPIN) 0.5 MG tablet      carisoprodol (SOMA) 350 MG tablet   2. Pain R52 780.96 carisoprodol (SOMA) 350 MG tablet   3. Neck pain M54.2 723.1 carisoprodol (SOMA) 350 MG tablet   4. Torticollis, acquired M43.6 723.5 clonazePAM (KLONOPIN) 0.5 MG tablet   5. Dysphagia, unspecified type R13.10 787.20    6. Abnormal involuntary movement R25.9 781.0 clonazePAM (KLONOPIN) 0.5 MG tablet     <-Plan->: 1) Increase clonazepam 0.5mg  1tab 2x/day and 2tabs at bedtime for muscle spasm.  2) May restart Soma 1tab 3x/day for muscle spasm/pain prn;  3) try to exercise daily at least 16mins/day to help sleep and mood and general health.      Note Author: Olen Cordial

## 2021-01-14 ENCOUNTER — Encounter (INDEPENDENT_AMBULATORY_CARE_PROVIDER_SITE_OTHER): Payer: Self-pay | Admitting: Hospital

## 2021-07-20 ENCOUNTER — Ambulatory Visit: Payer: Medicare HMO | Admitting: Allergy

## 2021-11-09 ENCOUNTER — Emergency Department (HOSPITAL_COMMUNITY): Payer: Medicare HMO

## 2021-11-09 ENCOUNTER — Inpatient Hospital Stay (HOSPITAL_COMMUNITY)
Admission: EM | Admit: 2021-11-09 | Discharge: 2021-11-15 | DRG: 042 | Disposition: A | Discharge status: Rehabilitation Facility | Payer: Medicare HMO | Attending: Internal Medicine | Admitting: Internal Medicine

## 2021-11-09 ENCOUNTER — Inpatient Hospital Stay (HOSPITAL_COMMUNITY): Payer: Medicare HMO

## 2021-11-09 ENCOUNTER — Other Ambulatory Visit: Payer: Self-pay

## 2021-11-09 ENCOUNTER — Encounter (HOSPITAL_COMMUNITY): Payer: Self-pay

## 2021-11-09 DIAGNOSIS — I63422 Cerebral infarction due to embolism of left anterior cerebral artery: Secondary | ICD-10-CM | POA: Diagnosis present

## 2021-11-09 DIAGNOSIS — G40909 Epilepsy, unspecified, not intractable, without status epilepticus: Secondary | ICD-10-CM | POA: Diagnosis present

## 2021-11-09 DIAGNOSIS — R29704 NIHSS score 4: Secondary | ICD-10-CM | POA: Diagnosis present

## 2021-11-09 DIAGNOSIS — R29898 Other symptoms and signs involving the musculoskeletal system: Secondary | ICD-10-CM | POA: Diagnosis not present

## 2021-11-09 DIAGNOSIS — E6609 Other obesity due to excess calories: Secondary | ICD-10-CM | POA: Diagnosis present

## 2021-11-09 DIAGNOSIS — Z20822 Contact with and (suspected) exposure to covid-19: Secondary | ICD-10-CM | POA: Diagnosis present

## 2021-11-09 DIAGNOSIS — R471 Dysarthria and anarthria: Secondary | ICD-10-CM | POA: Diagnosis present

## 2021-11-09 DIAGNOSIS — Z79899 Other long term (current) drug therapy: Secondary | ICD-10-CM

## 2021-11-09 DIAGNOSIS — Z87891 Personal history of nicotine dependence: Secondary | ICD-10-CM

## 2021-11-09 DIAGNOSIS — G8929 Other chronic pain: Secondary | ICD-10-CM | POA: Diagnosis present

## 2021-11-09 DIAGNOSIS — I639 Cerebral infarction, unspecified: Secondary | ICD-10-CM

## 2021-11-09 DIAGNOSIS — I10 Essential (primary) hypertension: Secondary | ICD-10-CM | POA: Diagnosis present

## 2021-11-09 DIAGNOSIS — I69398 Other sequelae of cerebral infarction: Secondary | ICD-10-CM

## 2021-11-09 DIAGNOSIS — E785 Hyperlipidemia, unspecified: Secondary | ICD-10-CM | POA: Diagnosis present

## 2021-11-09 DIAGNOSIS — Z90710 Acquired absence of both cervix and uterus: Secondary | ICD-10-CM | POA: Diagnosis not present

## 2021-11-09 DIAGNOSIS — Z6831 Body mass index (BMI) 31.0-31.9, adult: Secondary | ICD-10-CM

## 2021-11-09 DIAGNOSIS — Z86718 Personal history of other venous thrombosis and embolism: Secondary | ICD-10-CM | POA: Diagnosis not present

## 2021-11-09 DIAGNOSIS — Z888 Allergy status to other drugs, medicaments and biological substances status: Secondary | ICD-10-CM

## 2021-11-09 DIAGNOSIS — G249 Dystonia, unspecified: Secondary | ICD-10-CM | POA: Diagnosis present

## 2021-11-09 DIAGNOSIS — Z7984 Long term (current) use of oral hypoglycemic drugs: Secondary | ICD-10-CM

## 2021-11-09 DIAGNOSIS — E063 Autoimmune thyroiditis: Secondary | ICD-10-CM | POA: Diagnosis present

## 2021-11-09 DIAGNOSIS — G500 Trigeminal neuralgia: Secondary | ICD-10-CM | POA: Diagnosis present

## 2021-11-09 DIAGNOSIS — R262 Difficulty in walking, not elsewhere classified: Secondary | ICD-10-CM | POA: Diagnosis present

## 2021-11-09 DIAGNOSIS — Z79890 Hormone replacement therapy (postmenopausal): Secondary | ICD-10-CM

## 2021-11-09 DIAGNOSIS — G629 Polyneuropathy, unspecified: Secondary | ICD-10-CM | POA: Diagnosis present

## 2021-11-09 DIAGNOSIS — I358 Other nonrheumatic aortic valve disorders: Secondary | ICD-10-CM | POA: Diagnosis present

## 2021-11-09 DIAGNOSIS — R1310 Dysphagia, unspecified: Secondary | ICD-10-CM | POA: Diagnosis present

## 2021-11-09 DIAGNOSIS — G894 Chronic pain syndrome: Secondary | ICD-10-CM

## 2021-11-09 DIAGNOSIS — E039 Hypothyroidism, unspecified: Secondary | ICD-10-CM | POA: Diagnosis present

## 2021-11-09 DIAGNOSIS — Z881 Allergy status to other antibiotic agents status: Secondary | ICD-10-CM

## 2021-11-09 DIAGNOSIS — H409 Unspecified glaucoma: Secondary | ICD-10-CM | POA: Diagnosis present

## 2021-11-09 DIAGNOSIS — K900 Celiac disease: Secondary | ICD-10-CM | POA: Diagnosis present

## 2021-11-09 DIAGNOSIS — Z880 Allergy status to penicillin: Secondary | ICD-10-CM

## 2021-11-09 DIAGNOSIS — Z91018 Allergy to other foods: Secondary | ICD-10-CM

## 2021-11-09 DIAGNOSIS — Z886 Allergy status to analgesic agent status: Secondary | ICD-10-CM

## 2021-11-09 DIAGNOSIS — E1165 Type 2 diabetes mellitus with hyperglycemia: Secondary | ICD-10-CM | POA: Diagnosis present

## 2021-11-09 DIAGNOSIS — R42 Dizziness and giddiness: Secondary | ICD-10-CM | POA: Diagnosis present

## 2021-11-09 DIAGNOSIS — I6389 Other cerebral infarction: Secondary | ICD-10-CM | POA: Diagnosis not present

## 2021-11-09 HISTORY — DX: Other chronic pain: G89.29

## 2021-11-09 HISTORY — DX: Celiac disease: K90.0

## 2021-11-09 HISTORY — DX: Other obesity due to excess calories: E66.09

## 2021-11-09 HISTORY — DX: Cerebral infarction, unspecified: I63.9

## 2021-11-09 HISTORY — DX: Unspecified glaucoma: H40.9

## 2021-11-09 HISTORY — DX: Hyperlipidemia, unspecified: E78.5

## 2021-11-09 HISTORY — DX: Trigeminal neuralgia: G50.0

## 2021-11-09 HISTORY — DX: Essential (primary) hypertension: I10

## 2021-11-09 HISTORY — DX: Migraine, unspecified, not intractable, without status migrainosus: G43.909

## 2021-11-09 HISTORY — DX: Dystonia, unspecified: G24.9

## 2021-11-09 HISTORY — DX: Other obesity due to excess calories: Z68.31

## 2021-11-09 HISTORY — DX: Hypothyroidism, unspecified: E03.9

## 2021-11-09 HISTORY — DX: Cervicalgia: M54.2

## 2021-11-09 LAB — COMPREHENSIVE METABOLIC PANEL
ALT: 24 U/L (ref 0–44)
AST: 21 U/L (ref 15–41)
Albumin: 3.2 g/dL — ABNORMAL LOW (ref 3.5–5.0)
Alkaline Phosphatase: 82 U/L (ref 38–126)
Anion gap: 9 (ref 5–15)
BUN: 22 mg/dL (ref 8–23)
CO2: 21 mmol/L — ABNORMAL LOW (ref 22–32)
Calcium: 8.5 mg/dL — ABNORMAL LOW (ref 8.9–10.3)
Chloride: 108 mmol/L (ref 98–111)
Creatinine, Ser: 0.83 mg/dL (ref 0.44–1.00)
GFR, Estimated: >60 mL/min (ref >60)
Glucose, Bld: 161 mg/dL — ABNORMAL HIGH (ref 70–99)
Potassium: 4.3 mmol/L (ref 3.5–5.1)
Sodium: 138 mmol/L (ref 135–145)
Total Bilirubin: 0.4 mg/dL (ref 0.3–1.2)
Total Protein: 6.6 g/dL (ref 6.5–8.1)

## 2021-11-09 LAB — CBC
HCT: 44.6 % (ref 36.0–46.0)
Hemoglobin: 14.6 g/dL (ref 12.0–15.0)
MCH: 29 pg (ref 26.0–34.0)
MCHC: 32.7 g/dL (ref 30.0–36.0)
MCV: 88.7 fL (ref 80.0–100.0)
Platelets: 251 10*3/uL (ref 150–400)
RBC: 5.03 MIL/uL (ref 3.87–5.11)
RDW: 13.7 % (ref 11.5–15.5)
WBC: 8 10*3/uL (ref 4.0–10.5)
nRBC: 0 % (ref 0.0–0.2)

## 2021-11-09 LAB — RESP PANEL BY RT-PCR (FLU A&B, COVID) ARPGX2
Influenza A by PCR: NEGATIVE
Influenza B by PCR: NEGATIVE
SARS Coronavirus 2 by RT PCR: NEGATIVE

## 2021-11-09 LAB — PROTIME-INR
INR: 1 (ref 0.8–1.2)
Prothrombin Time: 13.3 seconds (ref 11.4–15.2)

## 2021-11-09 LAB — RAPID URINE DRUG SCREEN, HOSP PERFORMED
Amphetamines: NOT DETECTED
Barbiturates: NOT DETECTED
Benzodiazepines: NOT DETECTED
Cocaine: NOT DETECTED
Opiates: POSITIVE — AB
Tetrahydrocannabinol: NOT DETECTED

## 2021-11-09 LAB — URINALYSIS, ROUTINE W REFLEX MICROSCOPIC
Bilirubin Urine: NEGATIVE
Glucose, UA: >=500 mg/dL — AB
Hgb urine dipstick: NEGATIVE
Ketones, ur: NEGATIVE mg/dL
Leukocytes,Ua: NEGATIVE
Nitrite: NEGATIVE
Protein, ur: NEGATIVE mg/dL
Specific Gravity, Urine: 1.025 (ref 1.005–1.030)
pH: 5.5 (ref 5.0–8.0)

## 2021-11-09 LAB — GLUCOSE, CAPILLARY
Glucose-Capillary: 188 mg/dL — ABNORMAL HIGH (ref 70–99)
Glucose-Capillary: 252 mg/dL — ABNORMAL HIGH (ref 70–99)

## 2021-11-09 LAB — DIFFERENTIAL
Abs Immature Granulocytes: 0.04 10*3/uL (ref 0.00–0.07)
Basophils Absolute: 0.1 10*3/uL (ref 0.0–0.1)
Basophils Relative: 1 %
Eosinophils Absolute: 0.4 10*3/uL (ref 0.0–0.5)
Eosinophils Relative: 5 %
Immature Granulocytes: 1 %
Lymphocytes Relative: 19 %
Lymphs Abs: 1.5 10*3/uL (ref 0.7–4.0)
Monocytes Absolute: 0.8 10*3/uL (ref 0.1–1.0)
Monocytes Relative: 9 %
Neutro Abs: 5.3 10*3/uL (ref 1.7–7.7)
Neutrophils Relative %: 65 %

## 2021-11-09 LAB — APTT: aPTT: 29 seconds (ref 24–36)

## 2021-11-09 LAB — URINALYSIS, MICROSCOPIC (REFLEX)

## 2021-11-09 LAB — CBG MONITORING, ED: Glucose-Capillary: 102 mg/dL — ABNORMAL HIGH (ref 70–99)

## 2021-11-09 LAB — ETHANOL: Alcohol, Ethyl (B): <10 mg/dL (ref <10)

## 2021-11-09 MED ORDER — BACLOFEN 10 MG PO TABS
10.0000 mg | ORAL_TABLET | Freq: Every day | ORAL | Status: DC | PRN
  Administered 2021-11-11 – 2021-11-14 (×5): 10 mg via ORAL
  Filled 2021-11-09 (×7): qty 1

## 2021-11-09 MED ORDER — STROKE: EARLY STAGES OF RECOVERY BOOK
Freq: Once | Status: AC
  Filled 2021-11-09: qty 1

## 2021-11-09 MED ORDER — OXYCODONE HCL 5 MG PO TABS
5.0000 mg | ORAL_TABLET | Freq: Four times a day (QID) | ORAL | Status: DC | PRN
Start: 2021-11-09 — End: 2021-11-09
  Administered 2021-11-09: 5 mg via ORAL
  Filled 2021-11-09: qty 1

## 2021-11-09 MED ORDER — ACETAMINOPHEN 325 MG PO TABS: 650.0000 mg | ORAL_TABLET | ORAL | Status: DC | PRN

## 2021-11-09 MED ORDER — LEVETIRACETAM 750 MG PO TABS
750.0000 mg | ORAL_TABLET | Freq: Two times a day (BID) | ORAL | Status: DC
  Administered 2021-11-09 – 2021-11-15 (×12): 750 mg via ORAL
  Filled 2021-11-09 (×15): qty 1

## 2021-11-09 MED ORDER — ENOXAPARIN SODIUM 40 MG/0.4ML IJ SOSY
40.0000 mg | PREFILLED_SYRINGE | Freq: Every day | INTRAMUSCULAR | Status: DC
  Administered 2021-11-09 – 2021-11-15 (×7): 40 mg via SUBCUTANEOUS
  Filled 2021-11-09 (×7): qty 0.4

## 2021-11-09 MED ORDER — ACETAMINOPHEN 650 MG RE SUPP: 650.0000 mg | RECTAL | Status: DC | PRN

## 2021-11-09 MED ORDER — INSULIN ASPART 100 UNIT/ML IJ SOLN
0.0000 [IU] | Freq: Three times a day (TID) | INTRAMUSCULAR | Status: DC
  Administered 2021-11-10 – 2021-11-11 (×4): 3 [IU] via SUBCUTANEOUS
  Administered 2021-11-12: 5 [IU] via SUBCUTANEOUS
  Administered 2021-11-12 (×2): 2 [IU] via SUBCUTANEOUS
  Administered 2021-11-13 (×3): 3 [IU] via SUBCUTANEOUS
  Administered 2021-11-14: 5 [IU] via SUBCUTANEOUS
  Administered 2021-11-14: 3 [IU] via SUBCUTANEOUS
  Administered 2021-11-14: 2 [IU] via SUBCUTANEOUS
  Administered 2021-11-15 (×2): 3 [IU] via SUBCUTANEOUS

## 2021-11-09 MED ORDER — INSULIN DETEMIR 100 UNIT/ML ~~LOC~~ SOLN
80.0000 [IU] | Freq: Every day | SUBCUTANEOUS | Status: DC
  Administered 2021-11-09: 80 [IU] via SUBCUTANEOUS
  Filled 2021-11-09 (×2): qty 0.8

## 2021-11-09 MED ORDER — OXYCODONE-ACETAMINOPHEN 5-325 MG PO TABS
1.0000 | ORAL_TABLET | Freq: Four times a day (QID) | ORAL | Status: DC | PRN
Start: 2021-11-09 — End: 2021-11-15
  Administered 2021-11-09 – 2021-11-15 (×14): 1 via ORAL
  Filled 2021-11-09 (×15): qty 1

## 2021-11-09 MED ORDER — SODIUM CHLORIDE 0.9 % IV SOLN: INTRAVENOUS | Status: DC

## 2021-11-09 MED ORDER — ACETAMINOPHEN 160 MG/5ML PO SOLN: 650.0000 mg | ORAL | Status: DC | PRN

## 2021-11-09 MED ORDER — TIMOLOL MALEATE 0.5 % OP SOLN
1.0000 [drp] | Freq: Two times a day (BID) | OPHTHALMIC | Status: DC
  Administered 2021-11-09 – 2021-11-15 (×12): 1 [drp] via OPHTHALMIC
  Filled 2021-11-09: qty 5

## 2021-11-09 MED ORDER — LEVOTHYROXINE SODIUM 100 MCG PO TABS
100.0000 ug | ORAL_TABLET | Freq: Every day | ORAL | Status: DC
  Administered 2021-11-10 – 2021-11-15 (×6): 100 ug via ORAL
  Filled 2021-11-09 (×6): qty 1

## 2021-11-09 MED ORDER — ATORVASTATIN CALCIUM 40 MG PO TABS
40.0000 mg | ORAL_TABLET | Freq: Every day | ORAL | Status: DC
  Administered 2021-11-11 – 2021-11-14 (×4): 40 mg via ORAL
  Filled 2021-11-09 (×6): qty 1

## 2021-11-09 MED ORDER — SENNOSIDES-DOCUSATE SODIUM 8.6-50 MG PO TABS
1.0000 | ORAL_TABLET | Freq: Every evening | ORAL | Status: DC | PRN
  Administered 2021-11-10: 1 via ORAL
  Filled 2021-11-09: qty 1

## 2021-11-09 MED ORDER — OXYCODONE-ACETAMINOPHEN 10-325 MG PO TABS: 1.0000 | ORAL_TABLET | Freq: Four times a day (QID) | ORAL | Status: DC | PRN

## 2021-11-09 NOTE — Assessment & Plan Note (Signed)
-  Hold Ozempic

## 2021-11-09 NOTE — Assessment & Plan Note (Signed)
-  I have reviewed this patient in the [place] Controlled Substances Reporting System.  She is receiving medications from only one provider and appears to be taking them as prescribed. -She is at high risk of opioid misuse, diversion, or overdose. -Continue Percocet, Baclofen

## 2021-11-09 NOTE — ED Triage Notes (Signed)
Pt arrives via ems from home. Pt reports yesterday morning around 0600 she began experiencing dizziness and weakness in her right leg. Pt AxOx4.

## 2021-11-09 NOTE — Assessment & Plan Note (Signed)
Continue Keppra.

## 2021-11-09 NOTE — ED Notes (Signed)
Patient transported to MRI 

## 2021-11-09 NOTE — H&P (Signed)
History and Physical    Patient: Connie Campbell DQQ:[id number] DOB: 06-17-1951 DOA: 11/09/2021 DOS: the patient was seen and examined on 11/09/2021 PCP: Lilian Coma., MD  Patient coming from: Home - lives with husband and daughter and SIL; NOK: Marcelline Mates, [phone]   Chief Complaint:  Strokelike symptoms  HPI: Connie Campbell is a 71 y.o. female with medical history significant of glaucoma; CVA; and HTN presenting with neurologic symptoms. She reports that she has a h/o CVA.  She woke up the other day without a headache but was unable to walk, difficulty getting in and out of bed.  She was very dizzy.  It didn't get better so her daughter encouraged her to come to the ER.  Symptoms started day before yesterday, on 1/31.  No numbness/tingling but she was weak.  Her first stroke led to R-sided deficits and her second stroke led to speech and cognitive issues.  This time, she was unable to lift her R leg and was shuffling.  The arm was not as weak and the leg.  The leg is a little better but still weak.  Her speech is unchanged from baseline.  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      ER Course:   R sided weakness, worse than baseline, new dizziness.  MRI with CVA.  Neurology will consult.     Review of Systems: As mentioned in the history of present illness. All other systems reviewed and are negative. 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 She has never used smokeless tobacco.  She reports current alcohol use. She reports that she does not use drugs.  Allergies  Allergen Reactions   Aspirin Other (See Comments)    GI upset      Charentais Melon (French Melon) Other (See Comments)   Erythromycin Base Other (See Comments) and Rash   Ginkgo Biloba Other (See Comments) and Rash   Bactrim [Sulfamethoxazole-Trimethoprim] Hives   Beta Vulgaris    Ciprofloxacin    Coconut Oil Other (See Comments)    seizure   Gluten Meal Diarrhea   Keflex [Cephalexin] Swelling   Peanut Butter Flavor Hives   Penicillins Swelling   Tramadol Other (See Comments)    Depression, GI discomfort   Ibuprofen Diarrhea    Other reaction(s): GI intolerance STOMACH PAINS bleeding Other reaction(s): GI intoleran    History reviewed. No pertinent family history.  Prior to Admission medications   Medication Sig Start Date End Date Taking? Authorizing Provider  baclofen (LIORESAL) 10 MG tablet Take 10 mg by mouth daily as needed for muscle spasms. 09/18/21  Yes [provider]  insulin detemir (LEVEMIR FLEXTOUCH) 100 UNIT/ML FlexPen Inject 80 Units into the skin at bedtime. 06/12/19  Yes [provider]  JARDIANCE 25 MG TABS tablet Take 25 mg by mouth daily. 10/29/21  Yes [provider]  levETIRAcetam (KEPPRA) 750 MG  tablet Take 750 mg by mouth 2 (two) times daily. 10/16/21  Yes [provider]  levothyroxine (SYNTHROID) 100 MCG tablet Take 100 mcg by mouth daily. 01/22/20  Yes [provider]  metFORMIN (GLUCOPHAGE) 850 MG tablet Take 850 mg by mouth 3 (three) times daily. 09/11/21  Yes [provider]  Multiple Vitamin (MULTIVITAMIN) capsule Take 1 capsule by mouth daily.   Yes [provider]  oxyCODONE-acetaminophen (PERCOCET) 10-325 MG tablet Take 1 tablet by mouth 4 (four) times daily as needed for pain. 11/03/21  Yes [provider]  OZEMPIC, 0.25 OR 0.5 MG/DOSE, 2 MG/1.5ML SOPN Inject 0.5 mg into the skin once a week.  Wednesday 10/17/21  Yes [provider]  simethicone (MYLICON) 370 MG chewable tablet Chew 125 mg by mouth every 6 (six) hours as needed for flatulence.   Yes [provider]  SYNTHROID 100 MCG tablet Take 100 mcg by mouth daily. 08/24/21  Yes [provider]  telmisartan (MICARDIS) 40 MG tablet Take 40 mg by mouth daily. 09/03/21  Yes [provider]  timolol (TIMOPTIC) 0.5 % ophthalmic solution Place 1 drop into the right eye 2 (two) times daily. 10/07/21  Yes [provider]    Physical Exam: Vitals:   11/09/21 1315 11/09/21 1330 11/09/21 1502 11/09/21 1509  BP: (!) 148/89 (!) 142/88  133/75  Pulse: 79 75  79  Resp: 15 18  16   Temp:    97.7 F (36.5 C)  TempSrc:    Oral  SpO2: 98% 98%  98%  Weight:   83.3 kg   Height:       General:  Appears chronically ill Eyes:  PERRL, EOMI, normal lids, iris ENT:  hard of hearing, grossly normal lips & tongue, mmm Neck:  no LAD, masses or thyromegaly Cardiovascular:  RRR, no m/r/g. No LE edema.  Respiratory:   CTA bilaterally with no wheezes/rales/rhonchi.  Normal respiratory effort. Abdomen:  soft, NT, ND Skin:  no rash or induration seen on limited exam Musculoskeletal:  diminished strength of RLE 2-3/5, RUE 3-4/5 Psychiatric:  blunted mood and affect, speech dysarthric (patient states unchanged from baseline), AOx3 Neurologic:  CN 2-12 grossly intact, sensation intact?   Radiological Exams on Admission: Independently reviewed - see discussion in A/P where applicable  MR ANGIO HEAD WO CONTRAST  Result Date: 11/09/2021 CLINICAL DATA:  Acute stroke on MRI brain EXAM: MRA HEAD WITHOUT CONTRAST TECHNIQUE: Angiographic images of the Circle of Willis were acquired using MRA technique without intravenous contrast. COMPARISON:  No pertinent prior exam. FINDINGS: Anterior circulation: Intracranial internal carotid arteries are patent. Anterior and middle cerebral arteries are patent. 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 Posterior circulation: Intracranial vertebral arteries, basilar artery, and posterior cerebral arteries are patent. Fetal origin of the right PCA. Asymmetric relatively poor flow related enhancement of the right posterior communicating artery and posterior cerebral artery. IMPRESSION: No proximal intracranial vessel occlusion. Asymmetric poor flow related enhancement of right posterior communicating artery and posterior cerebral artery. May be on a technical basis given lack of acute findings in this distribution on the MRI. Electronically Signed   By: Macy Mis M.D.   On: 11/09/2021 14:29   MR BRAIN WO CONTRAST  Result Date: 11/09/2021 CLINICAL DATA:  Neuro deficit, acute, stroke suspected. Right-sided weakness and dizziness >24 hours; hx of right side weakness from prior CVA. EXAM: MRI HEAD WITHOUT CONTRAST TECHNIQUE: Multiplanar, multiecho pulse sequences of the brain and surrounding structures were obtained without intravenous  contrast. COMPARISON:  None. FINDINGS: Brain: There are small acute cortical and subcortical infarcts in the high posterior left frontal lobe. Small chronic cortical infarcts are noted in the parietal lobes and posterior right frontal lobe, and there are also multiple small chronic infarcts involving the right greater than left cerebellar hemispheres, thalami, and basal ganglia. T2 hyperintensities elsewhere in the cerebral white matter bilaterally are nonspecific but compatible with mild chronic small vessel ischemic disease. There is mild cerebral atrophy. No intracranial hemorrhage, mass, midline shift, or extra-axial fluid collection is identified. Vascular: Major intracranial vascular flow voids are preserved. Skull and upper cervical spine: Unremarkable bone marrow signal. Postoperative changes posteriorly in the upper cervical spine. Sinuses/Orbits: Bilateral cataract extraction. Complete opacification of the right maxillary sinus by  complex material. Complete opacification of the right frontal sinus as well. Milder mucosal thickening in the ethmoid and sphenoid sinuses. Trace right mastoid fluid. Other: None. IMPRESSION: 1. Small acute posterior left frontal lobe infarcts. 2. Chronic ischemia with multiple old cerebral and cerebellar infarcts as above. Electronically Signed   By: Logan Bores M.D.   On: 11/09/2021 09:55   VAS US CAROTID (at [facility] and [facility] only)  Result Date: 11/09/2021 Carotid Arterial Duplex Study Patient Name:  AKYIA BORELLI  Date of Exam:   11/09/2021 Medical Rec #: [id number]     Accession #:    [id number] Date of Birth: [birth year]     Patient Gender: F Patient Age:   60 years Exam Location:  [facility] Procedure:      VAS US CAROTID Referring Phys: Karmen Bongo --------------------------------------------------------------------------------  Indications:       CVA. Risk Factors:      Hypertension. Comparison Study:  No prior studies. Performing Technologist: Oliver Hum RVT  Examination Guidelines: A complete evaluation includes B-mode imaging, spectral Doppler, color Doppler, and power Doppler as needed of all accessible portions of each vessel. Bilateral testing is considered an integral part of a complete examination. Limited examinations for reoccurring indications may be performed as noted.  Right Carotid Findings: +----------+--------+--------+--------+-----------------------+--------+             PSV cm/s EDV cm/s Stenosis Plaque Description      Comments  +----------+--------+--------+--------+-----------------------+--------+  CCA Prox   69       10                smooth and heterogenous           +----------+--------+--------+--------+-----------------------+--------+  CCA Distal 50       11                smooth and heterogenous           +----------+--------+--------+--------+-----------------------+--------+  ICA Prox   42       8                 smooth and heterogenous            +----------+--------+--------+--------+-----------------------+--------+  ICA Distal 49       15                                        tortuous  +----------+--------+--------+--------+-----------------------+--------+  ECA        57       0                                                   +----------+--------+--------+--------+-----------------------+--------+ +----------+--------+-------+--------+-------------------+  PSV cm/s EDV cms Describe Arm Pressure (mmHG)  +----------+--------+-------+--------+-------------------+  Subclavian 103                                            +----------+--------+-------+--------+-------------------+ +---------+--------+--+--------+-+---------+  Vertebral PSV cm/s 28 EDV cm/s 6 Antegrade  +---------+--------+--+--------+-+---------+  Left Carotid Findings: +----------+--------+--------+--------+-----------------------+--------+             PSV cm/s EDV cm/s Stenosis Plaque Description      Comments  +----------+--------+--------+--------+-----------------------+--------+  CCA Prox   74       9                 smooth and heterogenous           +----------+--------+--------+--------+-----------------------+--------+  CCA Distal 61       11                smooth and heterogenous           +----------+--------+--------+--------+-----------------------+--------+  ICA Prox   37       7                 smooth and heterogenous           +----------+--------+--------+--------+-----------------------+--------+  ICA Distal 48       13                                        tortuous  +----------+--------+--------+--------+-----------------------+--------+  ECA        54       0                                                   +----------+--------+--------+--------+-----------------------+--------+ +----------+--------+--------+--------+-------------------+             PSV cm/s EDV cm/s Describe Arm Pressure (mmHG)  +----------+--------+--------+--------+-------------------+   Subclavian 136                                             +----------+--------+--------+--------+-------------------+ +---------+--------+--+--------+--+---------+  Vertebral PSV cm/s 54 EDV cm/s 14 Antegrade  +---------+--------+--+--------+--+---------+   Summary: Right Carotid: Velocities in the right ICA are consistent with a 1-39% stenosis. Left Carotid: Velocities in the left ICA are consistent with a 1-39% stenosis. Vertebrals: Bilateral vertebral arteries demonstrate antegrade flow. *See table(s) above for measurements and observations.     Preliminary     EKG: Independently reviewed.  NSR with rate 75;  no evidence of acute ischemia   Labs on Admission: I have personally reviewed the available labs and imaging studies at the time of the admission.  Pertinent labs:    Glucose 161 Albumin 3.2 Normal CBC COVID/flu negative UA: >500 glucose ETOH <10 UDS + opiates   Assessment and Plan: * Acute CVA (cerebrovascular accident) ([place])- (present on admission) -Patient with h/o CVA with mild R-sided deficits at baseline and impaired cognition/speech presenting with worsening RLE > RUE weakness -Concerning for CVA -Out of the window for tPA -Aspirin has been given to reduce stroke mortality and decrease morbidity -Will admit for CVA evaluation -  Telemetry monitoring -MRI/MRA -Carotid dopplers; if ipsilateral carotid stenosis is detected then prompt vascular surgery consultation is needed for consideration of CEA. -Echo -If the patient does not have known afib and this is not detected on telemetry during hospitalization, consider outpatient Holter monitoring and/or loop recorder placement. -Risk stratification with FLP, A1c; will also check TSH and UDS -Patient will need DAPT for 21 days when ABCD2 score is at least 4 and NIH score is 3 or less, and then can transition to monotherapy with a single antiplatelet agent.  Will defer to neurology for now. -Neurology  consult -PT/OT/ST/Nutrition Consults  HTN -Allow permissive HTN for now -Treat BP only if >220/120, and then with goal of 15% reduction -Hold ARB and plan to restart in 48-72 hours   HLD -Check FLP -Start empiric Lipitor 40 mg daily   DM -Check A1c -Hold home PO medications (Glucophage, Jardiance) -Will order moderate-scale SSI  Seizure disorder as sequela of cerebrovascular accident ([place]) -Continue Keppra  Chronic pain disorder- (present on admission) -I have reviewed this patient in the [place] Controlled Substances Reporting System.  She is receiving medications from only one provider and appears to be taking them as prescribed. -She is at high risk of opioid misuse, diversion, or overdose. -Continue Percocet, Baclofen  Class 1 obesity due to excess calories with body mass index (BMI) of 31.0 to 31.9 in adult -Hold Ozempic  Hypothyroidism (acquired)- (present on admission) -Continue Synthroid       Advance Care Planning:   Code Status: Full Code   Consults: Neurology; PT/OT/ST; nutrition; TOC team  DVT Prophylaxis: Lovenox  Family Communication: None present and she did not request that I call her husband  Severity of Illness: The appropriate patient status for this patient is INPATIENT. Inpatient status is judged to be reasonable and necessary in order to provide the required intensity of service to ensure the patient's safety. The patient's presenting symptoms, physical exam findings, and initial radiographic and laboratory data in the context of their chronic comorbidities is felt to place them at high risk for further clinical deterioration. Furthermore, it is not anticipated that the patient will be medically stable for discharge from the hospital within 2 midnights of admission.   * I certify that at the point of admission it is my clinical judgment that the patient will require inpatient hospital care spanning beyond 2 midnights from the point of admission due to  high intensity of service, high risk for further deterioration and high frequency of surveillance required.*  Author: Karmen Bongo, MD 11/09/2021 7:26 PM  For on call review [email or web address].

## 2021-11-09 NOTE — Progress Notes (Signed)
Home meds returned to family in bag originally transported with pt from ER.

## 2021-11-09 NOTE — ED Notes (Signed)
Pt to MRI

## 2021-11-09 NOTE — Consult Note (Signed)
Neurology stroke Consult H&P  Connie Campbell MR# [id number] 11/09/2021   CC: right leg weakness  History is obtained from: patient and chart.  HPI: Connie Campbell is a 71 y.o. female PMHx as reviewed below, ischemic strokes one of which was complicated by status epilepticus with residual right-sided deficits, trigeminal neuralgia, hypertension, diabetes.    Symptoms are associated with tingling of her right arm and right leg which she says is different than her baseline neuropathy.  Patient denies fall/injury, fever, chills, headache, vision changes, speech change, neck pain, chest pain/shortness of breath, abdominal pain, vomiting, diarrhea or any additional concerns.    LKW: unclear tNK given: No OSW IR Thrombectomy No, not indicated Modified Rankin Scale: 1-No significant post stroke disability and can perform usual duties with stroke symptoms NIHSS: 4  ROS: A complete ROS was performed and is negative except as noted in the HPI.   Past Medical History:  Diagnosis Date   Celiac disease    Glaucoma    Hypertension    Stroke [facility])    Trigeminal neuralgia    Social History:  reports that she has quit smoking. Her smoking use included cigarettes. She has never used smokeless tobacco. She reports that she does not drink alcohol and does not use drugs.  Prior to Admission medications   Medication Sig Start Date End Date Taking? Authorizing Provider  baclofen (LIORESAL) 10 MG tablet Take 10 mg by mouth daily as needed for muscle spasms. 09/18/21  Yes [provider]  insulin detemir (LEVEMIR FLEXTOUCH) 100 UNIT/ML FlexPen Inject 80 Units into the skin at bedtime. 06/12/19  Yes [provider]  JARDIANCE 25 MG TABS tablet Take 25 mg by mouth daily. 10/29/21  Yes [provider]  levETIRAcetam (KEPPRA) 750 MG tablet Take 750 mg by mouth 2 (two) times daily. 10/16/21  Yes [provider]  levothyroxine (SYNTHROID) 100 MCG tablet Take 100 mcg by mouth daily.  01/22/20  Yes [provider]  metFORMIN (GLUCOPHAGE) 850 MG tablet Take 850 mg by mouth 3 (three) times daily. 09/11/21  Yes [provider]  Multiple Vitamin (MULTIVITAMIN) capsule Take 1 capsule by mouth daily.   Yes [provider]  oxyCODONE-acetaminophen (PERCOCET) 10-325 MG tablet Take 1 tablet by mouth 4 (four) times daily as needed for pain. 11/03/21  Yes [provider]  OZEMPIC, 0.25 OR 0.5 MG/DOSE, 2 MG/1.5ML SOPN Inject 0.5 mg into the skin once a week. Wednesday 10/17/21  Yes [provider]  simethicone (MYLICON) 628 MG chewable tablet Chew 125 mg by mouth every 6 (six) hours as needed for flatulence.   Yes [provider]  SYNTHROID 100 MCG tablet Take 100 mcg by mouth daily. 08/24/21  Yes [provider]  telmisartan (MICARDIS) 40 MG tablet Take 40 mg by mouth daily. 09/03/21  Yes [provider]  timolol (TIMOPTIC) 0.5 % ophthalmic solution Place 1 drop into the right eye 2 (two) times daily. 10/07/21  Yes [provider]   Exam: Current vital signs: BP (!) 145/94    Pulse 73    Temp 98.3 F (36.8 C) (Oral)    Resp (!) 25    Ht 5' 3"  (1.6 m)    Wt 85.1 kg    SpO2 99%    BMI 33.23 kg/m   Physical Exam  Constitutional: Appears well-developed and well-nourished.  Psych: Affect appropriate to situation Eyes: No scleral injection HENT: No OP obstruction. Head: Normocephalic.  Cardiovascular: Normal rate and regular rhythm.  Respiratory: Effort  normal, symmetric excursions bilaterally, no audible wheezing. GI: Soft.  No distension. There is no tenderness.  Skin: WDI  Neuro: Mental Status: Patient is awake, alert, oriented to person, place, month, year, and situation. Patient is able to give a clear and coherent history. Speech fluent, intact comprehension and repetition. No signs of aphasia or neglect. Visual Fields are full. Pupils are equal, round, and reactive to light. EOMI without ptosis or  diploplia.  Facial sensation is symmetric to temperature Facial movement is symmetric.  Hearing is intact to voice. Uvula midline and palate elevates symmetrically. Shoulder shrug is symmetric. Tongue is midline without atrophy or fasciculations.  Tone is normal. Bulk is normal. Right lower extremity strength 1/4. Sensation is symmetric to light touch and temperature in the arms and legs. Deep Tendon Reflexes: 3+ right patellae. Toes up on right.  Gait - Deferred  I have reviewed labs in epic and the pertinent results are: Ca 8.5  I have reviewed the images obtained: MRI brain showed small acute posterior left frontal lobe infarcts and chronic ischemia with multiple old cerebral and cerebellar strokes. MRA head and neck showed no proximal intracranial vessel occlusion. Asymmetric poor flow related enhancement of right posterior communicating artery and posterior cerebral artery.  Assessment: Connie Campbell is a 71 y.o. female PMHx as noted above, seizure, strokes now with acute patchy embolic stroke in the left ACA.  Recommended aspirin however she states that she has gastritis which is why she is not taking aspirin. Perhaps she may  consider [place].  Impression:  Acute embolic stroke left ACA Right lower extremity weakness. Hx of thalamic stroke    Plan: - Recommend TTE. - Recommend labs: HbA1c, lipid panel. - Recommend Statin if LDL > 70 - V[place]alore (aspirin) 18m. - Clopidogrel 750mdaily for 3 weeks benefit outweighs the risk. - SBP goal <140/90. - Telemetry monitoring for arrhythmia. - Recommend bedside Swallow screen. - Recommend Stroke education. - Recommend PT/OT/SLP consult.  Electronically signed by:  HuLynnae SandhoffMD Page: [id number]/11/2021, 11:56 AM  If 7pm- 7am, please page neurology on call as listed in [place]

## 2021-11-09 NOTE — Assessment & Plan Note (Signed)
Continue Synthroid °

## 2021-11-09 NOTE — Progress Notes (Deleted)
Lives with husband and daughter and SIL; NOK: Marcelline Mates, [phone]  She reports that she has a h/o CVA.  She woke up the other day without a headache but was unable to walk, difficulty getting in and out of bed.  She was very dizzy.  It didn't get better so her daughter encouraged her to come to the ER.  Symptoms started day before yesterday, on 1/31.  No numbness/tingling but she was weak.  Her first stroke led to R-sided deficits and her second stroke led to speech and cognitive issues.  This time, she was unable to lift her R leg and was shuffling.  The arm was not as weak and the leg.  The leg is a little better but still weak.  Her speech is unchanged from baseline.  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

## 2021-11-09 NOTE — ED Notes (Signed)
Pt returns from MRI

## 2021-11-09 NOTE — ED Provider Notes (Signed)
[facility] Provider Note   CSN: [id number] Arrival date & time: 11/09/21  4401     History  Chief Complaint  Patient presents with   Dizziness   Weakness    Connie Campbell is a 71 y.o. female pertinent history includes CVA with residual right-sided deficits, trigeminal neuralgia, hypertension, diabetes.  Patient reports 6 AM yesterday, greater than 24 hours ago, she began to experience dizziness and difficulty moving her right arm and right leg.  Patient reports that she did have a CVA several years ago which left her with some right-sided deficits but after physical therapy does gradually improved and she performs ADLs without assistance.  She has had difficulty walking over the past day due to right leg weakness.  She was hoping symptoms would improve spontaneously which is why she waited greater than 1 day to present to the ER.  Additionally patient has some dysarthria at baseline which she reports is unchanged.  Symptoms are associated with tingling of her right arm and right leg, she reports baseline neuropathy does not feel like it is changed today.  Patient denies fall/injury, fever, chills, headache, vision changes, speech change, neck pain, chest pain/shortness of breath, abdominal pain, vomiting, diarrhea or any additional concerns.  HPI     Home Medications Prior to Admission medications   Medication Sig Start Date End Date Taking? Authorizing Provider  baclofen (LIORESAL) 10 MG tablet Take 10 mg by mouth daily as needed for muscle spasms. 09/18/21  Yes [provider]  insulin detemir (LEVEMIR FLEXTOUCH) 100 UNIT/ML FlexPen Inject 80 Units into the skin at bedtime. 06/12/19  Yes [provider]  JARDIANCE 25 MG TABS tablet Take 25 mg by mouth daily. 10/29/21  Yes [provider]  levETIRAcetam (KEPPRA) 750 MG tablet Take 750 mg by mouth 2 (two) times daily. 10/16/21  Yes [provider]  levothyroxine  (SYNTHROID) 100 MCG tablet Take 100 mcg by mouth daily. 01/22/20  Yes [provider]  metFORMIN (GLUCOPHAGE) 850 MG tablet Take 850 mg by mouth 3 (three) times daily. 09/11/21  Yes [provider]  Multiple Vitamin (MULTIVITAMIN) capsule Take 1 capsule by mouth daily.   Yes [provider]  oxyCODONE-acetaminophen (PERCOCET) 10-325 MG tablet Take 1 tablet by mouth 4 (four) times daily as needed for pain. 11/03/21  Yes [provider]  OZEMPIC, 0.25 OR 0.5 MG/DOSE, 2 MG/1.5ML SOPN Inject 0.5 mg into the skin once a week. Wednesday 10/17/21  Yes [provider]  simethicone (MYLICON) 027 MG chewable tablet Chew 125 mg by mouth every 6 (six) hours as needed for flatulence.   Yes [provider]  SYNTHROID 100 MCG tablet Take 100 mcg by mouth daily. 08/24/21  Yes [provider]  telmisartan (MICARDIS) 40 MG tablet Take 40 mg by mouth daily. 09/03/21  Yes [provider]  timolol (TIMOPTIC) 0.5 % ophthalmic solution Place 1 drop into the right eye 2 (two) times daily. 10/07/21  Yes [provider]      Allergies    Aspirin, Charentais melon (french melon), Erythromycin base, Ginkgo biloba, Bactrim [sulfamethoxazole-trimethoprim], Beta vulgaris, Ciprofloxacin, Coconut oil, Gluten meal, Keflex [cephalexin], Peanut butter flavor, Penicillins, Tramadol, and Ibuprofen    Review of Systems   Review of Systems Ten systems are reviewed and are negative for acute change except as noted in the HPI  Physical Exam Updated Vital Signs BP (!) 145/94    Pulse 73    Temp 98.3 F (36.8 C) (  Oral)    Resp (!) 25    Ht 5' 3"  (1.6 m)    Wt 85.1 kg    SpO2 99%    BMI 33.23 kg/m  Physical Exam Constitutional:      General: She is not in acute distress.    Appearance: Normal appearance. She is well-developed. She is not ill-appearing or diaphoretic.  HENT:     Head: Normocephalic and atraumatic.  Eyes:     General: Vision grossly intact.  Gaze aligned appropriately.     Pupils: Pupils are equal, round, and reactive to light.  Neck:     Trachea: Trachea and phonation normal.  Pulmonary:     Effort: Pulmonary effort is normal. No respiratory distress.  Abdominal:     General: There is no distension.     Palpations: Abdomen is soft.     Tenderness: There is no abdominal tenderness. There is no guarding or rebound.  Musculoskeletal:        General: Normal range of motion.     Cervical back: Normal range of motion.  Skin:    General: Skin is warm and dry.  Neurological:     Mental Status: She is alert.     GCS: GCS eye subscore is 4. GCS verbal subscore is 5. GCS motor subscore is 6.     Comments: Speech slightly slurred, patient reports as baseline.  Alert and oriented x3.  Follows commands without difficulty. Slight right-sided facial droop patient reports as baseline.  Otherwise cranial nerves appear intact. 3/5 strength right upper and lower extremity when compared to left Sensation normal to light and sharp touch Moves extremities without ataxia, coordination intact Normal finger to nose No pronator drift  Psychiatric:        Behavior: Behavior normal.    ED Results / Procedures / Treatments   Labs (all labs ordered are listed, but only abnormal results are displayed) Labs Reviewed  COMPREHENSIVE METABOLIC PANEL - Abnormal; Notable for the following components:      Result Value   CO2 21 (*)    Glucose, Bld 161 (*)    Calcium 8.5 (*)    Albumin 3.2 (*)    All other components within normal limits  RAPID URINE DRUG SCREEN, HOSP PERFORMED - Abnormal; Notable for the following components:   Opiates POSITIVE (*)    All other components within normal limits  URINALYSIS, ROUTINE W REFLEX MICROSCOPIC - Abnormal; Notable for the following components:   Glucose, UA >=500 (*)    All other components within normal limits  URINALYSIS, MICROSCOPIC (REFLEX) - Abnormal; Notable for the following components:   Bacteria,  UA RARE (*)    All other components within normal limits  RESP PANEL BY RT-PCR (FLU A&B, COVID) ARPGX2  ETHANOL  PROTIME-INR  APTT  CBC  DIFFERENTIAL    EKG EKG Interpretation  Date/Time:  Thursday November 09 2021 08:00:20 EST Ventricular Rate:  75 PR Interval:  164 QRS Duration: 75 QT Interval:  382 QTC Calculation: 427 R Axis:   -11 Text Interpretation: Sinus rhythm RSR' in V1 or V2, right VCD or RVH No old tracing to compare Confirmed by Aletta Edouard [phone]) on 11/09/2021 8:02:04 AM  Radiology MR BRAIN WO CONTRAST  Result Date: 11/09/2021 CLINICAL DATA:  Neuro deficit, acute, stroke suspected. Right-sided weakness and dizziness >24 hours; hx of right side weakness from prior CVA. EXAM: MRI HEAD WITHOUT CONTRAST TECHNIQUE: Multiplanar, multiecho pulse sequences of the brain and surrounding structures were obtained without intravenous  contrast. COMPARISON:  None. FINDINGS: Brain: There are small acute cortical and subcortical infarcts in the high posterior left frontal lobe. Small chronic cortical infarcts are noted in the parietal lobes and posterior right frontal lobe, and there are also multiple small chronic infarcts involving the right greater than left cerebellar hemispheres, thalami, and basal ganglia. T2 hyperintensities elsewhere in the cerebral white matter bilaterally are nonspecific but compatible with mild chronic small vessel ischemic disease. There is mild cerebral atrophy. No intracranial hemorrhage, mass, midline shift, or extra-axial fluid collection is identified. Vascular: Major intracranial vascular flow voids are preserved. Skull and upper cervical spine: Unremarkable bone marrow signal. Postoperative changes posteriorly in the upper cervical spine. Sinuses/Orbits: Bilateral cataract extraction. Complete opacification of the right maxillary sinus by complex material. Complete opacification of the right frontal sinus as well. Milder mucosal thickening in the ethmoid  and sphenoid sinuses. Trace right mastoid fluid. Other: None. IMPRESSION: 1. Small acute posterior left frontal lobe infarcts. 2. Chronic ischemia with multiple old cerebral and cerebellar infarcts as above. Electronically Signed   By: Logan Bores M.D.   On: 11/09/2021 09:55    Procedures Procedures    Medications Ordered in ED Medications - No data to display  ED Course/ Medical Decision Making/ A&P Clinical Course as of 11/09/21 1202  Thu Nov 10, 4455  5514 71 year old female here with complaint of dizziness and right leg weakness.  History of stroke with some right-sided deficits.  Not in tPA window.  Getting labs and imaging.  Disposition per results of testing. [MB]  1749 Neurology; medicine admit  [BM]  [place] Dr. Lorin Mercy [BM]    Clinical Course User Index [BM] Deliah Boston, PA-C [MB] Hayden Rasmussen, MD                           Medical Decision Making 71 year old female with history of CVA affecting her right upper and lower extremities presented today for weakness of the right upper and lower extremity as well as dizziness.  Symptom onset greater than 24 hours prior to arrival.  Patient is out of tPA window.  She has 3/5 strength right upper and lower extremities, she reports this is new.  Patient does have some right side facial droop but she reports this is baseline for her as well as her slightly dysarthric speech.  Patient has no other complaints today.  Patient will need labs and imaging to evaluate for possible stroke.  I discussed case with attending physician Dr. Melina Copa, will obtain MRI brain instead of CT.  Amount and/or Complexity of Data Reviewed External Data Reviewed: notes. Labs: ordered.    Details: CMP without emergent electrolyte derangement, AKI, LFT elevations or gap. UDS shows opioids otherwise negative. Urinalysis shows glycosuria, no evidence for infection. COVID/flu panel negative. Ethanol negative, patient does not appear to be in withdrawal. INR  within normal limits CBC within normal limits, no leukocytosis to suggest infectious process, no anemia or thrombocytopenia Radiology: ordered. ECG/medicine tests: ordered.    Details: EKG without acute ischemic changes, reviewed by Dr. Melina Copa  Risk Decision regarding hospitalization.  Critical Care Total time providing critical care: 30-74 minutes   MRI showed small acute posterior left frontal infarct which would explain patient's right-sided symptoms.  I consulted neurology who advised they will be by to see the patient and would like medicine admission.  I then consulted hospitalist team and spoke with Dr. Lorin Mercy, patient was accepted for admission.  Patient  was reevaluated she is resting comfortably in bed no acute distress vital signs stable on room air.  Patient states understanding of MRI results and need for admission, she is agreeable, no additional concerns at this time.  Patient was seen and evaluated by attending physician Dr. Melina Copa during this visit who agrees with plan of care.   Note: Portions of this report may have been transcribed using voice recognition software. Every effort was made to ensure accuracy; however, inadvertent computerized transcription errors may still be present.         Final Clinical Impression(s) / ED Diagnoses Final diagnoses:  Cerebrovascular accident (CVA), unspecified mechanism [facility])    Rx / DC Orders ED Discharge Orders     None         Gari Crown 11/09/21 1203    Hayden Rasmussen, MD 11/09/21 2025

## 2021-11-09 NOTE — Evaluation (Signed)
Occupational Therapy Evaluation Patient Details Name: Connie Campbell MRN: [id number] DOB: 1951/05/09 Today's Date: 11/09/2021   History of Present Illness 71 y.o. female history includes CVA with residual right-sided deficits, trigeminal neuralgia, hypertension, diabetes.   Patient presented to [facility] with dizziness and difficulty moving her right arm and right leg more than baseline.  IDP:OEUMP acute posterior left frontal lobe infarcts.   Clinical Impression   Patient presented with the above diagnosis and deficits.  PTA she lives at home with her husband, she is the primary CG for her husband, and her daughter and SIL, who recently moved in with them.  Her daughter assists with meals, home management and community mobility.  The patient reports that her daughter and SIL are unable to assist with the patient's husbands needs, and she is concerned about returning home without receiving post acute rehab to improve her balance and mobility.  Deficits impacting independence are listed below.  Currently she is needing Min A for basic mobility, and up to Mod A for lower body ADL.  OT will follow in the acute setting, she could return home if her daughter and SIL can provide needed assist for her and her husband, but she wants to pursue SNF level rehab initially.       Recommendations for follow up therapy are one component of a multi-disciplinary discharge planning process, led by the attending physician.  Recommendations may be updated based on patient status, additional functional criteria and insurance authorization.   Follow Up Recommendations  Skilled nursing-short term rehab (<3 hours/day)    Assistance Recommended at Discharge Frequent or constant Supervision/Assistance  Patient can return home with the following A lot of help with bathing/dressing/bathroom;A little help with walking and/or transfers;Assist for transportation;Assistance with cooking/housework    Functional Status Assessment   Patient has had a recent decline in their functional status and demonstrates the ability to make significant improvements in function in a reasonable and predictable amount of time.  Equipment Recommendations  BSC/3in1;Tub/shower bench    Recommendations for Other Services       Precautions / Restrictions Precautions Precautions: Fall Restrictions Weight Bearing Restrictions: No      Mobility Bed Mobility Overal bed mobility: Needs Assistance Bed Mobility: Supine to Sit     Supine to sit: Min guard     General bed mobility comments: increased time and use of side rail    Transfers Overall transfer level: Needs assistance   Transfers: Sit to/from Stand, Bed to chair/wheelchair/BSC Sit to Stand: Min assist     Step pivot transfers: Min assist            Balance Overall balance assessment: Needs assistance Sitting-balance support: Feet supported, Single extremity supported Sitting balance-Leahy Scale: Fair     Standing balance support: Bilateral upper extremity supported, Reliant on assistive device for balance Standing balance-Leahy Scale: Poor                             ADL either performed or assessed with clinical judgement   ADL       Grooming: Wash/dry hands;Wash/dry face;Set up;Sitting   Upper Body Bathing: Minimal assistance;Sitting   Lower Body Bathing: Moderate assistance;Sit to/from stand   Upper Body Dressing : Minimal assistance;Sitting   Lower Body Dressing: Moderate assistance;Sit to/from stand   Toilet Transfer: Minimal assistance;Stand-pivot;BSC/3in1   Toileting- Water quality scientist and Hygiene: Min guard  Vision Patient Visual Report: No change from baseline       Perception Perception Perception: Within Functional Limits   Praxis Praxis Praxis: Intact    Pertinent Vitals/Pain Pain Assessment Pain Assessment: Faces Faces Pain Scale: Hurts a little bit Pain Location: generalized mm  pain Pain Descriptors / Indicators: Sore Pain Intervention(s): Monitored during session     Hand Dominance Right   Extremity/Trunk Assessment Upper Extremity Assessment Upper Extremity Assessment: RUE deficits/detail RUE Deficits / Details: weakness and mixed tone RUE Sensation: decreased light touch RUE Coordination: decreased fine motor;decreased gross motor   Lower Extremity Assessment Lower Extremity Assessment: Defer to PT evaluation   Cervical / Trunk Assessment Cervical / Trunk Assessment: Kyphotic   Communication Communication Communication: No difficulties   Cognition Arousal/Alertness: Awake/alert Behavior During Therapy: WFL for tasks assessed/performed Overall Cognitive Status: Within Functional Limits for tasks assessed                                       General Comments   No dizziness experienced    Exercises     Shoulder Instructions      Home Living Family/patient expects to be discharged to:: Private residence Living Arrangements: Spouse/significant other;Children Available Help at Discharge: Family;Available PRN/intermittently Type of Home: House Home Access: Ramped entrance     Home Layout: One level;Other (Comment) (sunken living room with 4 stairs and HR on both sides)     Bathroom Shower/Tub: Teacher, early years/pre: Standard Bathroom Accessibility: Yes How Accessible: Accessible via walker Home Equipment: Coleman (2 wheels);Cane - single point;Grab bars - tub/shower   Additional Comments: spouse is disabled and patient is primary CG.  SIL and diaghter stay with them, work during the day at home, but can only provide home management, meals and community mobility.      Prior Functioning/Environment Prior Level of Function : Independent/Modified Independent             Mobility Comments: Walks with a SPC ADLs Comments: No assist per patient for ADL        OT Problem List: Decreased  strength;Decreased activity tolerance;Decreased coordination;Impaired balance (sitting and/or standing);Decreased knowledge of use of DME or AE;Impaired tone;Impaired UE functional use;Pain      OT Treatment/Interventions: Self-care/ADL training;Therapeutic exercise;Balance training;Therapeutic activities;DME and/or AE instruction;Patient/family education    OT Goals(Current goals can be found in the care plan section) Acute Rehab OT Goals Patient Stated Goal: Move like I did before OT Goal Formulation: With patient Time For Goal Achievement: 11/23/21  OT Frequency: Min 2X/week    Co-evaluation              AM-PAC OT "6 Clicks" Daily Activity     Outcome Measure Help from another person eating meals?: None Help from another person taking care of personal grooming?: A Little Help from another person toileting, which includes using toliet, bedpan, or urinal?: A Little Help from another person bathing (including washing, rinsing, drying)?: A Lot Help from another person to put on and taking off regular upper body clothing?: A Little Help from another person to put on and taking off regular lower body clothing?: A Lot 6 Click Score: 17   End of Session Equipment Utilized During Treatment: Gait belt;Rolling walker (2 wheels)  Activity Tolerance: Patient tolerated treatment well Patient left: in chair;with call bell/phone within reach;with chair alarm set  OT Visit Diagnosis: Unsteadiness on feet (  R26.81);Muscle weakness (generalized) (M62.81);Hemiplegia and hemiparesis Hemiplegia - Right/Left: Right Hemiplegia - dominant/non-dominant: Dominant Hemiplegia - caused by: Cerebral infarction                Time: 1541-1600 OT Time Calculation (min): 19 min Charges:  OT General Charges $OT Visit: 1 Visit OT Evaluation $OT Eval Moderate Complexity: 1 Mod  11/09/2021  RP, OTR/L  Acute Rehabilitation Services  Office:  [phone]   Metta Clines 11/09/2021, 4:15 PM

## 2021-11-09 NOTE — Assessment & Plan Note (Signed)
-  Patient with h/o CVA with mild R-sided deficits at baseline and impaired cognition/speech presenting with worsening RLE > RUE weakness -Concerning for CVA -Out of the window for tPA -Aspirin has been given to reduce stroke mortality and decrease morbidity -Will admit for CVA evaluation -Telemetry monitoring -MRI/MRA -Carotid dopplers; if ipsilateral carotid stenosis is detected then prompt vascular surgery consultation is needed for consideration of CEA. -Echo -If the patient does not have known afib and this is not detected on telemetry during hospitalization, consider outpatient Holter monitoring and/or loop recorder placement. -Risk stratification with FLP, A1c; will also check TSH and UDS -Patient will need DAPT for 21 days when ABCD2 score is at least 4 and NIH score is 3 or less, and then can transition to monotherapy with a single antiplatelet agent.  Will defer to neurology for now. -Neurology consult -PT/OT/ST/Nutrition Consults  HTN -Allow permissive HTN for now -Treat BP only if >220/120, and then with goal of 15% reduction -Hold ARB and plan to restart in 48-72 hours   HLD -Check FLP -Start empiric Lipitor 40 mg daily   DM -Check A1c -Hold home PO medications (Glucophage, Jardiance) -Will order moderate-scale SSI

## 2021-11-09 NOTE — Progress Notes (Signed)
SLP Cancellation Note  Patient Details Name: Connie Campbell MRN: [id number] DOB: 01-04-1951   Cancelled treatment:       Reason Eval/Treat Not Completed: Patient at procedure or test/unavailable (Pt off unit for MRI. SLP will reattempt eval later as schedule allows.)  [name] I. Hardin Negus, [place], [facility] Office number [phone] Pager [place] 11/09/2021, 4:16 PM

## 2021-11-09 NOTE — Progress Notes (Signed)
Carotid artery duplex has been completed. Preliminary results can be found in CV Proc through chart review.   11/09/21 2:41 PM Carlos Levering RVT

## 2021-11-10 ENCOUNTER — Inpatient Hospital Stay (HOSPITAL_COMMUNITY): Payer: Medicare HMO

## 2021-11-10 DIAGNOSIS — I10 Essential (primary) hypertension: Secondary | ICD-10-CM | POA: Diagnosis not present

## 2021-11-10 DIAGNOSIS — G894 Chronic pain syndrome: Secondary | ICD-10-CM | POA: Diagnosis not present

## 2021-11-10 DIAGNOSIS — I6389 Other cerebral infarction: Secondary | ICD-10-CM | POA: Diagnosis not present

## 2021-11-10 DIAGNOSIS — I639 Cerebral infarction, unspecified: Secondary | ICD-10-CM

## 2021-11-10 DIAGNOSIS — I69398 Other sequelae of cerebral infarction: Secondary | ICD-10-CM | POA: Diagnosis not present

## 2021-11-10 DIAGNOSIS — G40909 Epilepsy, unspecified, not intractable, without status epilepticus: Secondary | ICD-10-CM

## 2021-11-10 DIAGNOSIS — E039 Hypothyroidism, unspecified: Secondary | ICD-10-CM

## 2021-11-10 LAB — HEMOGLOBIN A1C
Hgb A1c MFr Bld: 8 % — ABNORMAL HIGH (ref 4.8–5.6)
Mean Plasma Glucose: 182.9 mg/dL

## 2021-11-10 LAB — ECHOCARDIOGRAM COMPLETE
AR max vel: 1.73 cm2
AV Area VTI: 1.81 cm2
AV Area mean vel: 1.63 cm2
AV Mean grad: 5 mmHg
AV Peak grad: 8.4 mmHg
Ao pk vel: 1.45 m/s
Area-P 1/2: 3.93 cm2
Calc EF: 69.7 %
Height: 63 in
MV VTI: 1.16 cm2
P 1/2 time: 680 msec
S' Lateral: 2.6 cm
Single Plane A2C EF: 60.4 %
Single Plane A4C EF: 77.8 %
Weight: 2938.29 oz

## 2021-11-10 LAB — GLUCOSE, CAPILLARY
Glucose-Capillary: 71 mg/dL (ref 70–99)
Glucose-Capillary: 165 mg/dL — ABNORMAL HIGH (ref 70–99)
Glucose-Capillary: 169 mg/dL — ABNORMAL HIGH (ref 70–99)
Glucose-Capillary: 149 mg/dL — ABNORMAL HIGH (ref 70–99)

## 2021-11-10 LAB — LIPID PANEL
Cholesterol: 162 mg/dL (ref 0–200)
HDL: 39 mg/dL — ABNORMAL LOW (ref >40)
LDL Cholesterol: 90 mg/dL (ref 0–99)
Total CHOL/HDL Ratio: 4.2 RATIO
Triglycerides: 166 mg/dL — ABNORMAL HIGH (ref <150)
VLDL: 33 mg/dL (ref 0–40)

## 2021-11-10 LAB — HIV ANTIBODY (ROUTINE TESTING W REFLEX): HIV Screen 4th Generation wRfx: NONREACTIVE

## 2021-11-10 MED ORDER — ADULT MULTIVITAMIN W/MINERALS CH
1.0000 | ORAL_TABLET | Freq: Every day | ORAL | Status: DC
  Administered 2021-11-10 – 2021-11-15 (×6): 1 via ORAL
  Filled 2021-11-10 (×6): qty 1

## 2021-11-10 MED ORDER — CLOPIDOGREL BISULFATE 75 MG PO TABS
75.0000 mg | ORAL_TABLET | Freq: Every day | ORAL | Status: DC
  Administered 2021-11-10 – 2021-11-15 (×6): 75 mg via ORAL
  Filled 2021-11-10 (×6): qty 1

## 2021-11-10 MED ORDER — INSULIN DETEMIR 100 UNIT/ML ~~LOC~~ SOLN
60.0000 [IU] | Freq: Every day | SUBCUTANEOUS | Status: DC
  Administered 2021-11-10 – 2021-11-14 (×5): 60 [IU] via SUBCUTANEOUS
  Filled 2021-11-10 (×6): qty 0.6

## 2021-11-10 MED ORDER — ASPIRIN EC 81 MG PO TBEC
81.0000 mg | DELAYED_RELEASE_TABLET | Freq: Every day | ORAL | Status: DC
  Administered 2021-11-10 – 2021-11-15 (×6): 81 mg via ORAL
  Filled 2021-11-10 (×6): qty 1

## 2021-11-10 NOTE — Progress Notes (Addendum)
STROKE TEAM PROGRESS NOTE   INTERVAL HISTORY No family at the bedside Patient reports that at the time of onset of sx, she was dizzy, couldn't walk appropriately 2/2 weakness of her LLE. Has had 2 strokes in the past and has had pinched nerves. Describes neuropathy as feeling like a mallet hitting her in the leg, and says that this is not how her current weakness felt. 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 No history of afib, palpitations. History of blood clots in legs. Was previously on coumadin. MRI with acute L ACA infarct.  On exam she still has mild right leg weakness but it is improving  Vitals:   11/09/21 1946 11/09/21 2329 11/10/21 0404 11/10/21 0742  BP: 127/74 110/72 (!) 142/67 (!) 143/74  Pulse: 87 70 76 76  Resp: 18 17 18 20   Temp: 97.8 F (36.6 C) 97.8 F (36.6 C) 97.9 F (36.6 C) 98.5 F (36.9 C)  TempSrc: Oral Oral Oral Oral  SpO2: 94% 94% 97% 96%  Weight:      Height:       CBC:  Recent Labs  Lab 11/09/21 0800  WBC 8.0  NEUTROABS 5.3  HGB 14.6  HCT 44.6  MCV 88.7  PLT 485   Basic Metabolic Panel:  Recent Labs  Lab 11/09/21 0800  NA 138  K 4.3  CL 108  CO2 21*  GLUCOSE 161*  BUN 22  CREATININE 0.83  CALCIUM 8.5*   Lipid Panel:  Recent Labs  Lab 11/10/21 0429  CHOL 162  TRIG 166*  HDL 39*  CHOLHDL 4.2  VLDL 33  LDLCALC 90   HgbA1c:  Recent Labs  Lab 11/10/21 0429  HGBA1C 8.0*   Urine Drug Screen:  Recent Labs  Lab 11/09/21 0845  LABOPIA POSITIVE*  COCAINSCRNUR NONE DETECTED  LABBENZ NONE DETECTED  AMPHETMU NONE DETECTED  THCU NONE DETECTED  LABBARB NONE DETECTED    Alcohol Level  Recent Labs  Lab 11/09/21 0800  ETH <10    IMAGING past 24 hours MR ANGIO HEAD WO CONTRAST  Result Date: 11/09/2021 CLINICAL DATA:  Acute stroke on MRI brain EXAM: MRA HEAD WITHOUT CONTRAST TECHNIQUE: Angiographic images of the Circle of Willis were acquired using MRA technique without  intravenous contrast. COMPARISON:  No pertinent prior exam. FINDINGS: Anterior circulation: Intracranial internal carotid arteries are patent. Anterior and middle cerebral arteries are patent. 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 Posterior circulation: Intracranial vertebral arteries, basilar artery, and posterior cerebral arteries are patent. Fetal origin of the right PCA. Asymmetric relatively poor flow related enhancement of the right posterior communicating artery and posterior cerebral artery. IMPRESSION: No proximal intracranial vessel occlusion. Asymmetric poor flow related enhancement of right posterior communicating artery and posterior cerebral artery. May be on a technical basis given lack of acute findings in this distribution on the MRI. Electronically Signed   By: Macy Mis M.D.   On: 11/09/2021 14:29   MR BRAIN WO CONTRAST  Result Date: 11/09/2021 CLINICAL DATA:  Neuro deficit, acute, stroke suspected. Right-sided weakness and dizziness >24 hours; hx of right side weakness from prior CVA. EXAM: MRI HEAD WITHOUT CONTRAST TECHNIQUE: Multiplanar, multiecho pulse sequences of the brain and surrounding structures were obtained without intravenous contrast. COMPARISON:  None. FINDINGS: Brain: There are small acute cortical and subcortical infarcts in the high posterior left frontal lobe. Small chronic cortical infarcts are noted in the parietal lobes and posterior right frontal lobe,  and there are also multiple small chronic infarcts involving the right greater than left cerebellar hemispheres, thalami, and basal ganglia. T2 hyperintensities elsewhere in the cerebral white matter bilaterally are nonspecific but compatible with mild chronic small vessel ischemic disease. There is mild cerebral atrophy. No intracranial hemorrhage, mass, midline shift, or extra-axial fluid collection is identified. Vascular: Major intracranial vascular flow voids are preserved. Skull and upper cervical  spine: Unremarkable bone marrow signal. Postoperative changes posteriorly in the upper cervical spine. Sinuses/Orbits: Bilateral cataract extraction. Complete opacification of the right maxillary sinus by complex material. Complete opacification of the right frontal sinus as well. Milder mucosal thickening in the ethmoid and sphenoid sinuses. Trace right mastoid fluid. Other: None. IMPRESSION: 1. Small acute posterior left frontal lobe infarcts. 2. Chronic ischemia with multiple old cerebral and cerebellar infarcts as above. Electronically Signed   By: Logan Bores M.D.   On: 11/09/2021 09:55   VAS US CAROTID (at [facility] and [facility] only)  Result Date: 11/09/2021 Carotid Arterial Duplex Study Patient Name:  HAYDON KALMAR  Date of Exam:   11/09/2021 Medical Rec #: [id number]     Accession #:    [id number] Date of Birth: [birth year]     Patient Gender: F Patient Age:   71 years Exam Location:  [facility] Procedure:      VAS US CAROTID Referring Phys: Karmen Bongo --------------------------------------------------------------------------------  Indications:       CVA. Risk Factors:      Hypertension. Comparison Study:  No prior studies. Performing Technologist: Oliver Hum RVT  Examination Guidelines: A complete evaluation includes B-mode imaging, spectral Doppler, color Doppler, and power Doppler as needed of all accessible portions of each vessel. Bilateral testing is considered an integral part of a complete examination. Limited examinations for reoccurring indications may be performed as noted.  Right Carotid Findings: +----------+--------+--------+--------+-----------------------+--------+             PSV cm/s EDV cm/s Stenosis Plaque Description      Comments  +----------+--------+--------+--------+-----------------------+--------+  CCA Prox   69       10                smooth and heterogenous           +----------+--------+--------+--------+-----------------------+--------+  CCA Distal 50       11                 smooth and heterogenous           +----------+--------+--------+--------+-----------------------+--------+  ICA Prox   42       8                 smooth and heterogenous           +----------+--------+--------+--------+-----------------------+--------+  ICA Distal 49       15                                        tortuous  +----------+--------+--------+--------+-----------------------+--------+  ECA        57       0                                                   +----------+--------+--------+--------+-----------------------+--------+ +----------+--------+-------+--------+-------------------+  PSV cm/s EDV cms Describe Arm Pressure (mmHG)  +----------+--------+-------+--------+-------------------+  Subclavian 103                                            +----------+--------+-------+--------+-------------------+ +---------+--------+--+--------+-+---------+  Vertebral PSV cm/s 28 EDV cm/s 6 Antegrade  +---------+--------+--+--------+-+---------+  Left Carotid Findings: +----------+--------+--------+--------+-----------------------+--------+             PSV cm/s EDV cm/s Stenosis Plaque Description      Comments  +----------+--------+--------+--------+-----------------------+--------+  CCA Prox   74       9                 smooth and heterogenous           +----------+--------+--------+--------+-----------------------+--------+  CCA Distal 61       11                smooth and heterogenous           +----------+--------+--------+--------+-----------------------+--------+  ICA Prox   37       7                 smooth and heterogenous           +----------+--------+--------+--------+-----------------------+--------+  ICA Distal 48       13                                        tortuous  +----------+--------+--------+--------+-----------------------+--------+  ECA        54       0                                                   +----------+--------+--------+--------+-----------------------+--------+  +----------+--------+--------+--------+-------------------+             PSV cm/s EDV cm/s Describe Arm Pressure (mmHG)  +----------+--------+--------+--------+-------------------+  Subclavian 136                                             +----------+--------+--------+--------+-------------------+ +---------+--------+--+--------+--+---------+  Vertebral PSV cm/s 54 EDV cm/s 14 Antegrade  +---------+--------+--+--------+--+---------+   Summary: Right Carotid: Velocities in the right ICA are consistent with a 1-39% stenosis. Left Carotid: Velocities in the left ICA are consistent with a 1-39% stenosis. Vertebrals: Bilateral vertebral arteries demonstrate antegrade flow. *See table(s) above for measurements and observations.     Preliminary     PHYSICAL EXAM  Physical Exam  Constitutional: Appears well-developed and well-nourished pleasant elderly Caucasian lady.  Psych: Affect appropriate to situation Eyes: No scleral injection HENT: No OP obstrucion MSK: no joint deformities.  Cardiovascular: Normal rate and regular rhythm.  Respiratory: Effort normal, non-labored breathing GI: Soft.  No distension. There is no tenderness.  Skin: WDI  Neuro: Mental Status: Patient is awake, alert, oriented to person, place, month, year, and situation. Patient is able to give a clear and coherent history. No signs of neglect but patient has baseline dysarthria and mild expressive aphasia Cranial Nerves: II: Visual Fields are full. Pupils are equal, round, and reactive to light.   III,IV, VI:  EOMI without ptosis or diploplia.  V: Facial sensation is symmetric to light touch VII: Facial movement is symmetric resting and smiling VIII: Hearing is intact to voice X: Palate elevates symmetrically XI: Shoulder shrug is symmetric. XII: Tongue protrudes midline without atrophy or fasciculations.  Motor: Tone is normal. Bulk is normal. 4/5 strength in LLE and LUE but 3/5 in RLE and  4/5 in  RUE. Sensory: Sensation is symmetric to light touch in the arms but R>L in the legs. No extinction to DSS present.  Cerebellar: FNF intact bilaterally    ASSESSMENT/PLAN Ms. Connie Campbell is a 71 y.o. 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   Stroke: of left anterior cerebral artery infarct likely due to embolism of DVT vs. Cardiogenic source. Will need further workup to determine etiology MRI  Small acute L frontal lobe infarct in the distribution of ACA; multiple chronic areas of ischemia in cerebrum and cerebellum MRA  No large vessel occlusion Carotid Doppler: Bilateral ICA with 1-39% stenosis 2D Echo: LVEF 60-65%, Trivial MVR and AVR. Aortic valve sclerosis without stenosis.  LE Doppler pending d/t h/o DVT. LDL 90 HgbA1c 8.0 VTE prophylaxis - SCDs    Diet   Diet heart healthy/carb modified Room service appropriate? Yes; Fluid consistency: Thin   No antithrombotic and previously on aspirin but d/c 2/2 h/o gastritis  prior to admission, now on  enteric coated aspirin 81 mg and plavix 75 mg x 3 weeks, then plavix alone .  Therapy recommendations:  CIR Disposition:  CIR pending  Hypertension Home meds:  Telmisartan 40 mg daily Stable Systolic goal <277 Long-term BP goal normotensive  Hyperlipidemia Home meds:  N/A LDL 90, goal < 70 Added Atorvastatin 40 mg  Continue statin at discharge  Diabetes type II Uncontrolled Home meds:  Levemir 80u qHS, Jardiance 25 mg, Metformin 850 mg TID, Ozempic 0.5 mg weekly on Wednesdays HgbA1c 8.0, goal < 7.0 CBGs Recent Labs    11/09/21 1714 11/09/21 2215 11/10/21 0607  GLUCAP 188* 252* 71   SSI and continue home Levemir  Other Stroke Risk Factors Advanced Age >/= 65  ETOH use, alcohol level <10, advised to drink no more than 1-2 drink(s) a day Substance  abuse - UDS:  THC NONE DETECTED, Cocaine NONE DETECTED. Patient advised to stop using due to stroke risk. Obesity, Body mass index is 32.53 kg/m., BMI >/= 30 associated with increased stroke risk, recommend weight loss, diet and exercise as appropriate  Hx stroke/TIA 6 years ago- memory deficits, dystonia, seizures 8 years ago- neuropathy  Other Active Problems Hypothyroidism 2/2 Hashimoto thyroiditis Continue home Synthroid 100 mcg daily  Hospital day # 1  I have personally obtained history,examined this patient, reviewed notes, independently viewed imaging studies, participated in medical decision making and plan of care.ROS completed by me personally and pertinent positives fully documented  I have made any additions or clarifications directly to the above note. Agree with note above.  Patient presented with sudden onset of right leg weakness due to left ACA branch infarct likely of embolic etiology.  Strong suspicion for underlying paroxysmal A-fib and she will need loop recorder at discharge.  Recommend aspirin and Plavix for 3 weeks followed by Plavix alone.  Aggressive risk factor modification.  Physical and Occupational Therapy consults.  Greater than 50% time during this 50-minute visit) in counseling and coordination of care about  embolic stroke and discussion about evaluation, prevention and treatment.  Discussed with Dr. Marisa Hua, [place] Pager: [phone] 11/10/2021 4:20 PM   To contact Stroke Continuity provider, please refer to [email or web address] After hours, contact General Neurology

## 2021-11-10 NOTE — Progress Notes (Signed)
Occupational Therapy Treatment Patient Details Name: Connie Campbell MRN: [id number] DOB: 12-19-1950 Today's Date: 11/10/2021   History of present illness 71 y.o. female history includes CVA with residual right-sided deficits, trigeminal neuralgia, hypertension, diabetes.   Patient presented to [facility] with dizziness and difficulty moving her right arm and right leg more than baseline.  ZJI:RCVEL acute posterior left frontal lobe infarcts.   OT comments  This 71 yo female is improving from min A to min guard A for all basic ADLs and associated mobility. She is the primary caregiver for her husband who is in a W/C--she has to help him sometimes with bathing and dressing as well as dress his LEs due to wounds. She normally gets around with [facility] intermittently or does not use an AD, currently she is using a RW which makes her mobility safer but will hinder her ability to care for her husband. She would greatly benefit from a short stay on [facility] to get back to her PLOF so she can return home and continue to care for her husband.   Recommendations for follow up therapy are one component of a multi-disciplinary discharge planning process, led by the attending physician.  Recommendations may be updated based on patient status, additional functional criteria and insurance authorization.    Follow Up Recommendations  Acute inpatient rehab (3hours/day)    Assistance Recommended at Discharge None (if goes to [facility])  Patient can return home with the following  A little help with walking and/or transfers;A little help with bathing/dressing/bathroom;Help with stairs or ramp for entrance;Assist for transportation;Assistance with cooking/housework   Equipment Recommendations  BSC/3in1;Tub/shower bench       Precautions / Restrictions Precautions Precautions: Fall Restrictions Weight Bearing Restrictions: No       Mobility Bed Mobility               General bed mobility comments: up in recliner upon  arrival    Transfers Overall transfer level: Needs assistance Equipment used: Rolling walker (2 wheels) Transfers: Sit to/from Stand Sit to Stand: Min guard                 Balance Overall balance assessment: Needs assistance Sitting-balance support: No upper extremity supported, Feet supported Sitting balance-Leahy Scale: Good     Standing balance support: No upper extremity supported, During functional activity Standing balance-Leahy Scale: Fair                             ADL either performed or assessed with clinical judgement   ADL Overall ADL's : Needs assistance/impaired     Grooming: Wash/dry hands;Oral care;Standing;Min guard               Lower Body Dressing: Min guard;Sit to/from stand Lower Body Dressing Details (indicate cue type and reason): Can do socks but struggles (more with left than right) Toilet Transfer: Min guard;Ambulation;Rolling walker (2 wheels);Comfort height toilet;Grab bars   Toileting- Clothing Manipulation and Hygiene: Min guard;Sit to/from stand              Extremity/Trunk Assessment Upper Extremity Assessment Upper Extremity Assessment: Generalized weakness            Vision Baseline Vision/History: 0 No visual deficits Ability to See in Adequate Light: 0 Adequate Patient Visual Report: No change from baseline            Cognition Arousal/Alertness: Awake/alert Behavior During Therapy: WFL for tasks assessed/performed Overall Cognitive Status: Impaired/Different from baseline  Area of Impairment: Safety/judgement                         Safety/Judgement: Decreased awareness of safety (with RW use (put if off to the side when she went to wash her hands from coming out of the bathroom)                         Pertinent Vitals/ Pain       Pain Assessment Pain Assessment: No/denies pain  Home Living     Available Help at Discharge: Family;Available PRN/intermittently Type of  Home: House                              Lives With: Spouse        Frequency  Min 2X/week        Progress Toward Goals  OT Goals(current goals can now be found in the care plan section)  Progress towards OT goals: Progressing toward goals  Acute Rehab OT Goals Patient Stated Goal: go to rehab then home OT Goal Formulation: With patient Time For Goal Achievement: 11/23/21  Plan Discharge plan needs to be updated       AM-PAC OT "6 Clicks" Daily Activity     Outcome Measure   Help from another person eating meals?: None Help from another person taking care of personal grooming?: A Little Help from another person toileting, which includes using toliet, bedpan, or urinal?: A Little Help from another person bathing (including washing, rinsing, drying)?: A Little Help from another person to put on and taking off regular upper body clothing?: A Little Help from another person to put on and taking off regular lower body clothing?: A Little 6 Click Score: 19    End of Session Equipment Utilized During Treatment: Gait belt;Rolling walker (2 wheels)  OT Visit Diagnosis: Unsteadiness on feet (R26.81);Muscle weakness (generalized) (M62.81)   Activity Tolerance Patient tolerated treatment well   Patient Left in chair;with call bell/phone within reach;with chair alarm set           Time: 1203-1219 OT Time Calculation (min): 16 min  Charges: OT General Charges $OT Visit: 1 Visit OT Treatments $Self Care/Home Management : 8-22 mins  [address], OTR/L Acute [employer] Pager [phone] Office [phone]    Almon Register 11/10/2021, 12:57 PM

## 2021-11-10 NOTE — Evaluation (Signed)
Physical Therapy Evaluation Patient Details Name: Connie Campbell MRN: [id number] DOB: December 30, 1950 Today's Date: 11/10/2021  History of Present Illness  Pt is a 71 y/o. female who presents with dizziness and difficulty moving her right arm and right leg more than baseline. MRI revealed small acute posterior left frontal lobe infarcts. PMH significant for CVA with residual right-sided deficits, trigeminal neuralgia, hypertension, diabetes.  Clinical Impression  Pt presents with the above with the impairments below. Patient had decreased LE strength in RLE: 3+/5. Pt performed bed mobility and ambulation in the hallway with min guard and min assist with balance during gait. She was limited by her decreased functional mobility and fatigue. Patient had slight decrease in coordination on the RLE with heel to shin test. PT will follow patient as needed. Patient would benefit from acute inpatient rehab due to coordination, strength deficits, and functional mobility deficits so she can return to caring for herself and her disabled husband at home independently.      Recommendations for follow up therapy are one component of a multi-disciplinary discharge planning process, led by the attending physician.  Recommendations may be updated based on patient status, additional functional criteria and insurance authorization.  Follow Up Recommendations Acute inpatient rehab (3hours/day)    Assistance Recommended at Discharge PRN  Patient can return home with the following  Assistance with cooking/housework;Assist for transportation;Help with stairs or ramp for entrance;A little help with bathing/dressing/bathroom;A little help with walking and/or transfers    Equipment Recommendations None recommended by PT  Recommendations for Other Services       Functional Status Assessment Patient has had a recent decline in their functional status and demonstrates the ability to make significant improvements in function in a  reasonable and predictable amount of time.     Precautions / Restrictions Precautions Precautions: Fall Restrictions Weight Bearing Restrictions: No      Mobility  Bed Mobility Overal bed mobility: Needs Assistance Bed Mobility: Supine to Sit     Supine to sit: Min guard     General bed mobility comments: Increased time and use of rails required. No assist to transition fully to EOB.    Transfers Overall transfer level: Needs assistance Equipment used: Rolling walker (2 wheels) Transfers: Sit to/from Stand Sit to Stand: Min guard   Step pivot transfers: Min assist       General transfer comment: min assist with sit to stand transfer with VC's for hand placement. Needed min assist to maintain balance upon initial sit to stand transfer. Patient had some impulsivety with transfers and ambulation but was corrected with verbal cues to wait before next activity.    Ambulation/Gait Ambulation/Gait assistance: Min assist Gait Distance (Feet): 75 Feet Assistive device: Rolling walker (2 wheels) Gait Pattern/deviations: Step-to pattern, Decreased step length - right, Decreased step length - left, Decreased stride length, Decreased dorsiflexion - right, Shuffle, Narrow base of support Gait velocity: decreased Gait velocity interpretation: <1.31 ft/sec, indicative of household ambulator   General Gait Details: Patient required VC's to not drag right foot as much on the ground and was able to correct. She had trouble with a step to pattern and was very slow. Needed intermittent min assist with balance during gait.  Stairs            Wheelchair Mobility    Modified Rankin (Stroke Patients Only)       Balance Overall balance assessment: Needs assistance Sitting-balance support: No upper extremity supported, Feet supported Sitting balance-Leahy Scale: Good  Standing balance support: No upper extremity supported, During functional activity Standing balance-Leahy  Scale: Fair Standing balance comment: BUE support using 2 wheel rolling walker                             Pertinent Vitals/Pain Pain Assessment Faces Pain Scale: No hurt Pain Location: generalized mm pain Pain Descriptors / Indicators: Sore    Home Living Family/patient expects to be discharged to:: Private residence Living Arrangements: Spouse/significant other;Children Available Help at Discharge: Family;Available PRN/intermittently Type of Home: House Home Access: Ramped entrance       Home Layout: One level;Other (Comment) (sunken living room with 4 stairs and HR on both sides) Home Equipment: Rolling Walker (2 wheels);Cane - single point;Grab bars - tub/shower Additional Comments: spouse is disabled and patient is primary CG.  SIL and diaghter stay with them, work during the day at home, but can only provide home management, meals and community mobility.    Prior Function Prior Level of Function : Independent/Modified Independent             Mobility Comments: Walks with a SPC ADLs Comments: No assist per patient for ADL     Hand Dominance   Dominant Hand: Right    Extremity/Trunk Assessment   Upper Extremity Assessment Upper Extremity Assessment: Generalized weakness;RUE deficits/detail RUE Deficits / Details: weakness and mixed tone; Mild weakness in grip R>L RUE Sensation: decreased light touch RUE Coordination: decreased fine motor;decreased gross motor    Lower Extremity Assessment Lower Extremity Assessment: RLE deficits/detail RLE Deficits / Details: Decreased strength and mild coordination deficits noted during functional mobility. Grossly    Cervical / Trunk Assessment Cervical / Trunk Assessment: Kyphotic  Communication   Communication: No difficulties  Cognition Arousal/Alertness: Awake/alert Behavior During Therapy: WFL for tasks assessed/performed Overall Cognitive Status: Impaired/Different from baseline Area of Impairment:  Safety/judgement                         Safety/Judgement: Decreased awareness of safety, Decreased awareness of deficits              General Comments      Exercises     Assessment/Plan    PT Assessment Patient needs continued PT services  PT Problem List Decreased strength;Decreased activity tolerance;Decreased balance;Decreased mobility;Decreased coordination       PT Treatment Interventions      PT Goals (Current goals can be found in the Care Plan section)  Acute Rehab PT Goals Patient Stated Goal: to return home to care for her husband PT Goal Formulation: With patient Time For Goal Achievement: 11/24/21 Potential to Achieve Goals: Good    Frequency Min 5X/week     Co-evaluation               AM-PAC PT "6 Clicks" Mobility  Outcome Measure Help needed turning from your back to your side while in a flat bed without using bedrails?: A Little Help needed moving from lying on your back to sitting on the side of a flat bed without using bedrails?: A Lot Help needed moving to and from a bed to a chair (including a wheelchair)?: A Little Help needed standing up from a chair using your arms (e.g., wheelchair or bedside chair)?: A Little Help needed to walk in hospital room?: A Little Help needed climbing 3-5 steps with a railing? : A Lot 6 Click Score: 16    End of  Session Equipment Utilized During Treatment: Gait belt Activity Tolerance: Patient tolerated treatment well Patient left: in chair;with chair alarm set Nurse Communication: Mobility status PT Visit Diagnosis: Unsteadiness on feet (R26.81);Other abnormalities of gait and mobility (R26.89);Muscle weakness (generalized) (M62.81);Dizziness and giddiness (R42)    Time: 4235-3614 PT Time Calculation (min) (ACUTE ONLY): 27 min   Charges:   PT Evaluation $PT Eval Moderate Complexity: 1 Mod PT Treatments $Gait Training: 8-22 mins        Jonne Ply, SPT  [place] 11/10/2021,  2:03 PM

## 2021-11-10 NOTE — Progress Notes (Signed)
Inpatient Diabetes Program Recommendations  AACE/ADA: New Consensus Statement on Inpatient Glycemic Control   Target Ranges:  Prepandial:   less than 140 mg/dL      Peak postprandial:   less than 180 mg/dL (1-2 hours)      Critically ill patients:  140 - 180 mg/dL    Latest Reference Range & Units 11/09/21 13:09 11/09/21 17:14 11/09/21 22:15 11/10/21 06:07  Glucose-Capillary 70 - 99 mg/dL 102 (H) 188 (H) 252 (H) 71   Review of Glycemic Control  Diabetes history: DM2 Outpatient Diabetes medications: Levemir 80 nits QHS, Jardiance 25 mg daily, Metformin 850 mg TID, Ozempic 0.5 mg Qweek Current orders for Inpatient glycemic control: Levemir 80 units QHS, Novolog 0-15 units TID with meals  Inpatient Diabetes Program Recommendations:    Insulin: Patient received Levemir 80 units last night at 22:28 and fasting glucose 71 mg/dl today. Please consider decreasing Levemir to 60 units QHS (75% of outpatient dose).  Thanks, Barnie Alderman, RN, MSN, CDE Diabetes Coordinator Inpatient Diabetes Program [phone] (Team Pager from 8am to 5pm)

## 2021-11-10 NOTE — Progress Notes (Signed)
PROGRESS NOTE  Connie Campbell [id number]:[id number] DOB: [birth year] DOA: 11/09/2021 PCP: Lilian Coma., MD  HPI/Recap of past 24 hours: Connie Campbell is a 71 y.o. female with medical history significant of glaucoma, CVA and HTN presenting inability to walk, difficulty getting out of bed and dizziness that started 11/07/21. Pt was unable to lift her R leg and was shuffling. Of note, her first stroke led to R-sided deficits and her second stroke led to speech and cognitive issues. Her speech is unchanged from baseline.  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 In the ED, MRI showed acute CVA. Neurology consulted.  Patient admitted for further management.    Today, patient denies any new complaints, reports weakness is still present on the right lower extremity but improving.  Patient denies any chest pain, shortness of breath, abdominal pain, nausea/vomiting, fever/chills, worsening dizziness.  Assessment/Plan: Principal Problem:   Acute CVA (cerebrovascular accident) ([place]) Active Problems:   Hypertension   Dyslipidemia   Hypothyroidism (acquired)   Class 1 obesity due to excess calories with body mass index (BMI) of 31.0 to 31.9 in adult   Chronic pain disorder   Seizure disorder as sequela of cerebrovascular accident ([place])   Small acute left frontal lobe infarct  Embolic versus cardiogenic source MRI showed above, MRA no large vessel occlusion Carotid Doppler with bilateral ICA with 1 to 39% stenosis Echo with EF of 60 to 65%, aortic valve sclerosis without stenosis Lower extremity Doppler negative for DVT LDL 90, A1c 8 Neurology consulted, plan for aspirin and Plavix x3 weeks, then Plavix alone.  Recommend loop recorder upon discharge to rule out underlying paroxysmal A. Fib, cardiology/EP consulted PT/OT/ST-recommend CIR   HTN Allow permissive HTN for now Hold ARB and plan to restart in 48-72 hours   HLD LDL 90 Lipitor 40 mg daily (pt reports it worsens her  dystonia the last time she took it, may consider trying again and monitor)   DM type 2, uncontrolled A1c 8 Hold home PO medications (Glucophage, Jardiance) SSI, levemir, Accu-Cheks, hypoglycemic protocol   Seizure disorder as sequela of cerebrovascular accident  Continue Keppra   Chronic pain disorder Continue Percocet, Baclofen   Obesity Lifestyle modification advised   Hypothyroidism (acquired) Continue Synthroid        Malnutrition Type:  Nutrition Problem: Increased nutrient needs Etiology: acute illness   Malnutrition Characteristics:  Signs/Symptoms: estimated needs   Nutrition Interventions:  Interventions: MVI    Estimated body mass index is 32.53 kg/m as calculated from the following:   Height as of this encounter: 5' 3"  (1.6 m).   Weight as of this encounter: 83.3 kg.     Code Status: Full  Family Communication: None at bedside  Disposition Plan: Status is: Inpatient Remains inpatient appropriate because: Level of care    Consultants: Neurology  Procedures: None  Antimicrobials: None  DVT prophylaxis: Lovenox   Objective: Vitals:   11/10/21 0404 11/10/21 0742 11/10/21 1135 11/10/21 1600  BP: (!) 142/67 (!) 143/74 (!) 142/77 112/66  Pulse: 76 76 76 74  Resp: 18 20 20 20   Temp: 97.9 F (36.6 C) 98.5 F (36.9 C) 97.9 F (36.6 C) (!) 97.5 F (36.4 C)  TempSrc: Oral Oral Oral Oral  SpO2: 97% 96% 98% 95%  Weight:      Height:        Intake/Output Summary (Last 24 hours) at 11/10/2021 1627 Last data filed at 11/10/2021 0743 Gross per 24 hour  Intake 240 ml  Output --  Net 240 ml   Filed Weights   11/09/21 0752 11/09/21 1502  Weight: 85.1 kg 83.3 kg    Exam: General: NAD, poor dentition  Cardiovascular: S1, S2 present Respiratory: CTAB Abdomen: Soft, nontender, nondistended, bowel sounds present Musculoskeletal: No bilateral pedal edema noted Skin: Normal Psychiatry: Normal mood  Neurology: 4/5 strength in LLE and  LUE, 3/5 in RLE and 4/5 in RUE    Data Reviewed: CBC: Recent Labs  Lab 11/09/21 0800  WBC 8.0  NEUTROABS 5.3  HGB 14.6  HCT 44.6  MCV 88.7  PLT 536   Basic Metabolic Panel: Recent Labs  Lab 11/09/21 0800  NA 138  K 4.3  CL 108  CO2 21*  GLUCOSE 161*  BUN 22  CREATININE 0.83  CALCIUM 8.5*   GFR: Estimated Creatinine Clearance: 65.4 mL/min (by C-G formula based on SCr of 0.83 mg/dL). Liver Function Tests: Recent Labs  Lab 11/09/21 0800  AST 21  ALT 24  ALKPHOS 82  BILITOT 0.4  PROT 6.6  ALBUMIN 3.2*   No results for input(s): LIPASE, AMYLASE in the last 168 hours. No results for input(s): AMMONIA in the last 168 hours. Coagulation Profile: Recent Labs  Lab 11/09/21 0800  INR 1.0   Cardiac Enzymes: No results for input(s): CKTOTAL, CKMB, CKMBINDEX, TROPONINI in the last 168 hours. BNP (last 3 results) No results for input(s): PROBNP in the last 8760 hours. HbA1C: Recent Labs    11/10/21 0429  HGBA1C 8.0*   CBG: Recent Labs  Lab 11/09/21 1714 11/09/21 2215 11/10/21 0607 11/10/21 1137 11/10/21 1606  GLUCAP 188* 252* 71 165* 169*   Lipid Profile: Recent Labs    11/10/21 0429  CHOL 162  HDL 39*  LDLCALC 90  TRIG 166*  CHOLHDL 4.2   Thyroid Function Tests: No results for input(s): TSH, T4TOTAL, FREET4, T3FREE, THYROIDAB in the last 72 hours. Anemia Panel: No results for input(s): VITAMINB12, FOLATE, FERRITIN, TIBC, IRON, RETICCTPCT in the last 72 hours. Urine analysis:    Component Value Date/Time   COLORURINE YELLOW 11/09/2021 0801   APPEARANCEUR CLEAR 11/09/2021 0801   LABSPEC 1.025 11/09/2021 0801   PHURINE 5.5 11/09/2021 0801   GLUCOSEU >=500 (A) 11/09/2021 0801   HGBUR NEGATIVE 11/09/2021 0801   BILIRUBINUR NEGATIVE 11/09/2021 0801   KETONESUR NEGATIVE 11/09/2021 0801   PROTEINUR NEGATIVE 11/09/2021 0801   NITRITE NEGATIVE 11/09/2021 0801   LEUKOCYTESUR NEGATIVE 11/09/2021 0801   Sepsis  Labs: @LABRCNTIP (procalcitonin:4,lacticidven:4)  ) Recent Results (from the past 240 hour(s))  Resp Panel by RT-PCR (Flu A&B, Covid) Nasopharyngeal Swab     Status: None   Collection Time: 11/09/21  8:00 AM   Specimen: Nasopharyngeal Swab; Nasopharyngeal(NP) swabs in vial transport medium  Result Value Ref Range Status   SARS Coronavirus 2 by RT PCR NEGATIVE NEGATIVE Final    Comment: (NOTE) SARS-CoV-2 target nucleic acids are NOT DETECTED.  The SARS-CoV-2 RNA is generally detectable in upper respiratory specimens during the acute phase of infection. The lowest concentration of SARS-CoV-2 viral copies this assay can detect is 138 copies/mL. A negative result does not preclude SARS-Cov-2 infection and should not be used as the sole basis for treatment or other patient management decisions. A negative result may occur with  improper specimen collection/handling, submission of specimen other than nasopharyngeal swab, presence of viral mutation(s) within the areas targeted by this assay, and inadequate number of viral copies(<138 copies/mL). A negative result must be combined with clinical observations, patient history, and epidemiological information. The expected result  is Negative.  Fact Sheet for Patients:  EntrepreneurPulse.com.au  Fact Sheet for Healthcare Providers:  IncredibleEmployment.be  This test is no t yet approved or cleared by the [place] FDA and  has been authorized for detection and/or diagnosis of SARS-CoV-2 by FDA under an Emergency Use Authorization (EUA). This EUA will remain  in effect (meaning this test can be used) for the duration of the COVID-19 declaration under Section 564(b)(1) of the Act, 21 U.S.C.section 360bbb-3(b)(1), unless the authorization is terminated  or revoked sooner.       Influenza A by PCR NEGATIVE NEGATIVE Final   Influenza B by PCR NEGATIVE NEGATIVE Final    Comment: (NOTE) The Xpert  Xpress SARS-CoV-2/FLU/RSV plus assay is intended as an aid in the diagnosis of influenza from Nasopharyngeal swab specimens and should not be used as a sole basis for treatment. Nasal washings and aspirates are unacceptable for Xpert Xpress SARS-CoV-2/FLU/RSV testing.  Fact Sheet for Patients: EntrepreneurPulse.com.au  Fact Sheet for Healthcare Providers: IncredibleEmployment.be  This test is not yet approved or cleared by the [place] FDA and has been authorized for detection and/or diagnosis of SARS-CoV-2 by FDA under an Emergency Use Authorization (EUA). This EUA will remain in effect (meaning this test can be used) for the duration of the COVID-19 declaration under Section 564(b)(1) of the Act, 21 U.S.C. section 360bbb-3(b)(1), unless the authorization is terminated or revoked.  Performed at [facility], [place] [address] [place], [place] [address]       Studies: ECHOCARDIOGRAM COMPLETE  Result Date: 11/10/2021    ECHOCARDIOGRAM REPORT   Patient Name:   Connie Campbell Date of Exam: 11/10/2021 Medical Rec #:  [id number]    Height:       63.0 in Accession #:    [id number]   Weight:       183.6 lb Date of Birth:  [birth year]    BSA:          1.865 m Patient Age:    52 years     BP:           142/67 mmHg Patient Gender: F            HR:           76 bpm. Exam Location:  Inpatient Procedure: 2D Echo, Cardiac Doppler and Color Doppler Indications:    I63.9 STROKE  History:        Patient has no prior history of Echocardiogram examinations.                 Stroke; Risk Factors:Hypertension and Dyslipidemia.  Sonographer:    Beryle Beams Referring Phys: [place]  1. Left ventricular ejection fraction, by estimation, is 60 to 65%. The left ventricle has normal function. The left ventricle has no regional wall motion abnormalities. Left ventricular diastolic parameters were normal.  2. Right ventricular systolic function is normal.  The right ventricular size is normal. There is moderately elevated pulmonary artery systolic pressure.  3. The mitral valve is abnormal. Trivial mitral valve regurgitation. No evidence of mitral stenosis.  4. The aortic valve was not well visualized. There is mild calcification of the aortic valve. Aortic valve regurgitation is trivial. Aortic valve sclerosis is present, with no evidence of aortic valve stenosis.  5. The inferior vena cava is normal in size with greater than 50% respiratory variability, suggesting right atrial pressure of 3 mmHg. FINDINGS  Left Ventricle: Left ventricular ejection fraction, by estimation, is 60 to  65%. The left ventricle has normal function. The left ventricle has no regional wall motion abnormalities. The left ventricular internal cavity size was normal in size. There is  no left ventricular hypertrophy. Left ventricular diastolic parameters were normal. Right Ventricle: The right ventricular size is normal. No increase in right ventricular wall thickness. Right ventricular systolic function is normal. There is moderately elevated pulmonary artery systolic pressure. The tricuspid regurgitant velocity is 3.43 m/s, and with an assumed right atrial pressure of 3 mmHg, the estimated right ventricular systolic pressure is 28.3 mmHg. Left Atrium: Left atrial size was normal in size. Right Atrium: Right atrial size was normal in size. Pericardium: There is no evidence of pericardial effusion. Mitral Valve: The mitral valve is abnormal. There is mild thickening of the mitral valve leaflet(s). There is mild calcification of the mitral valve leaflet(s). Trivial mitral valve regurgitation. No evidence of mitral valve stenosis. MV peak gradient, 39.9 mmHg. The mean mitral valve gradient is 24.0 mmHg. Tricuspid Valve: The tricuspid valve is normal in structure. Tricuspid valve regurgitation is not demonstrated. No evidence of tricuspid stenosis. Aortic Valve: The aortic valve was not well  visualized. There is mild calcification of the aortic valve. Aortic valve regurgitation is trivial. Aortic regurgitation PHT measures 680 msec. Aortic valve sclerosis is present, with no evidence of aortic valve stenosis. Aortic valve mean gradient measures 5.0 mmHg. Aortic valve peak gradient measures 8.4 mmHg. Aortic valve area, by VTI measures 1.81 cm. Pulmonic Valve: The pulmonic valve was normal in structure. Pulmonic valve regurgitation is not visualized. No evidence of pulmonic stenosis. Aorta: The aortic root is normal in size and structure. Venous: The inferior vena cava is normal in size with greater than 50% respiratory variability, suggesting right atrial pressure of 3 mmHg. IAS/Shunts: No atrial level shunt detected by color flow Doppler.  LEFT VENTRICLE PLAX 2D LVIDd:         3.70 cm     Diastology LVIDs:         2.60 cm     LV e' medial:    7.51 cm/s LV PW:         1.10 cm     LV E/e' medial:  15.7 LV IVS:        0.90 cm     LV e' lateral:   13.40 cm/s LVOT diam:     1.80 cm     LV E/e' lateral: 8.8 LV SV:         52 LV SV Index:   28 LVOT Area:     2.54 cm  LV Volumes (MOD) LV vol d, MOD A2C: 41.9 ml LV vol d, MOD A4C: 42.5 ml LV vol s, MOD A2C: 16.6 ml LV vol s, MOD A4C: 9.4 ml LV SV MOD A2C:     25.3 ml LV SV MOD A4C:     42.5 ml LV SV MOD BP:      30.2 ml RIGHT VENTRICLE             IVC RV S prime:     11.40 cm/s  IVC diam: 2.30 cm TAPSE (M-mode): 2.3 cm LEFT ATRIUM             Index        RIGHT ATRIUM          Index LA diam:        3.20 cm 1.72 cm/m   RA Area:     8.84 cm LA Vol (A2C):   59.5  ml 31.91 ml/m  RA Volume:   15.80 ml 8.47 ml/m LA Vol (A4C):   49.2 ml 26.38 ml/m LA Biplane Vol: 56.6 ml 30.35 ml/m  AORTIC VALVE                     PULMONIC VALVE AV Area (Vmax):    1.73 cm      PV Vmax:       0.69 m/s AV Area (Vmean):   1.63 cm      PV Vmean:      43.100 cm/s AV Area (VTI):     1.81 cm      PV VTI:        0.124 m AV Vmax:           145.00 cm/s   PV Peak grad:  1.9 mmHg AV  Vmean:          102.000 cm/s  PV Mean grad:  1.0 mmHg AV VTI:            0.289 m AV Peak Grad:      8.4 mmHg AV Mean Grad:      5.0 mmHg LVOT Vmax:         98.50 cm/s LVOT Vmean:        65.500 cm/s LVOT VTI:          0.206 m LVOT/AV VTI ratio: 0.71 AI PHT:            680 msec  AORTA Ao Root diam: 2.20 cm Ao Asc diam:  2.40 cm MITRAL VALVE                TRICUSPID VALVE MV Area (PHT): 3.93 cm     TV Peak grad:   48.2 mmHg MV Area VTI:   1.16 cm     TV Mean grad:   28.0 mmHg MV Peak grad:  39.9 mmHg    TV Vmax:        3.47 m/s MV Mean grad:  24.0 mmHg    TV Vmean:       247.0 cm/s MV Vmax:       3.16 m/s     TV VTI:         0.72 msec MV Vmean:      226.0 cm/s   TR Peak grad:   47.1 mmHg MV Decel Time: 193 msec     TR Vmax:        343.00 cm/s MV E velocity: 118.00 cm/s MV A velocity: 109.00 cm/s  SHUNTS MV E/A ratio:  1.08         Systemic VTI:  0.21 m                             Systemic Diam: 1.80 cm Jenkins Rouge MD Electronically signed by Jenkins Rouge MD Signature Date/Time: 11/10/2021/8:40:51 AM    Final    VAS [place] LOWER EXTREMITY VENOUS (DVT)  Result Date: 11/10/2021  Lower Venous DVT Study Patient Name:  Connie Campbell  Date of Exam:   11/10/2021 Medical Rec #: [id number]     Accession #:    [id number] Date of Birth: [birth year]     Patient Gender: F Patient Age:   51 years Exam Location:  [facility] Procedure:      VAS [place] LOWER EXTREMITY VENOUS (DVT) Referring Phys: [facility] Addyson Traub --------------------------------------------------------------------------------  Indications: Stroke.  Comparison Study: no prior Performing Technologist: Archie Patten RVS  Examination Guidelines: A complete evaluation includes B-mode imaging, spectral Doppler, color Doppler, and power Doppler as needed of all accessible portions of each vessel. Bilateral testing is considered an integral part of a complete examination. Limited examinations for reoccurring indications may be performed as noted. The reflux portion of the  exam is performed with the patient in reverse Trendelenburg.  +---------+---------------+---------+-----------+----------+--------------+  RIGHT     Compressibility Phasicity Spontaneity Properties Thrombus Aging  +---------+---------------+---------+-----------+----------+--------------+  CFV       Full            Yes       Yes                                    +---------+---------------+---------+-----------+----------+--------------+  SFJ       Full                                                             +---------+---------------+---------+-----------+----------+--------------+  FV Prox   Full                                                             +---------+---------------+---------+-----------+----------+--------------+  FV Mid    Full                                                             +---------+---------------+---------+-----------+----------+--------------+  FV Distal Full                                                             +---------+---------------+---------+-----------+----------+--------------+  PFV       Full                                                             +---------+---------------+---------+-----------+----------+--------------+  POP       Full            Yes       Yes                                    +---------+---------------+---------+-----------+----------+--------------+  PTV       Full                                                             +---------+---------------+---------+-----------+----------+--------------+  PERO      Full                                                             +---------+---------------+---------+-----------+----------+--------------+   +---------+---------------+---------+-----------+----------+-------------------+  LEFT      Compressibility Phasicity Spontaneity Properties Thrombus Aging       +---------+---------------+---------+-----------+----------+-------------------+  CFV       Full            Yes       Yes                                          +---------+---------------+---------+-----------+----------+-------------------+  SFJ       Full                                                                  +---------+---------------+---------+-----------+----------+-------------------+  FV Prox   Full                                                                  +---------+---------------+---------+-----------+----------+-------------------+  FV Mid    Full                                                                  +---------+---------------+---------+-----------+----------+-------------------+  FV Distal Full                                                                  +---------+---------------+---------+-----------+----------+-------------------+  PFV       Full                                                                  +---------+---------------+---------+-----------+----------+-------------------+  POP       Full            Yes       Yes                                         +---------+---------------+---------+-----------+----------+-------------------+  PTV  Full                                                                  +---------+---------------+---------+-----------+----------+-------------------+  PERO                                                       Not well visualized  +---------+---------------+---------+-----------+----------+-------------------+     Summary: BILATERAL: - No evidence of deep vein thrombosis seen in the lower extremities, bilaterally. -No evidence of popliteal cyst, bilaterally.   *See table(s) above for measurements and observations.    Preliminary     Scheduled Meds:  aspirin EC  81 mg Oral Daily   atorvastatin  40 mg Oral Daily   clopidogrel  75 mg Oral Daily   enoxaparin (LOVENOX) injection  40 mg Subcutaneous Daily   insulin aspart  0-15 Units Subcutaneous TID WC   insulin detemir  80 Units Subcutaneous QHS   levETIRAcetam  750 mg Oral BID    levothyroxine  100 mcg Oral QAC breakfast   multivitamin with minerals  1 tablet Oral Daily   timolol  1 drop Right Eye BID    Continuous Infusions:  sodium chloride 50 mL/hr at 11/09/21 2053     LOS: 1 day     Alma Friendly, MD [facility]  If 7PM-7AM, please contact night-coverage [email or web address] 11/10/2021, 4:27 PM

## 2021-11-10 NOTE — Progress Notes (Signed)
° °  Inpatient Rehab Admissions Coordinator : ° °Per therapy change in recommendations, patient was screened for CIR candidacy by Macio Kissoon RN MSN.  At this time patient appears to be a potential candidate for CIR. I will place a rehab consult per protocol for full assessment. Please call me with any questions. ° °Mariyanna Mucha RN MSN °Admissions Coordinator °[phone] °  °

## 2021-11-10 NOTE — TOC Initial Note (Addendum)
Transition of Care [facility]) - Initial/Assessment Note    Patient Details  Name: Connie Campbell MRN: [id number] Date of Birth: 07-Dec-1950  Transition of Care [facility]) CM/SW Contact:    Geralynn Ochs, LCSW Phone Number: 11/10/2021, 10:58 AM  Clinical Narrative:       CSW met with patient to discuss request for SNF. Patient indicated that she was no longer interested in SNF and wanted to go home with home health. Patient's spouse was recently active with [facility], she would like to begin with them. CSW provided referral, [facility] can accept. CSW asked therapy assigned today to discuss any home DME needs. CSW to follow.           UPDATE 1:48 PM: CSW updated by PT/OT that plan is now for CIR and patient is agreeable. Will hold on [facility] pending CIR evaluation.    Expected Discharge Plan: [place] Barriers to Discharge: Continued Medical Work up   Patient Goals and CMS Choice Patient states their goals for this hospitalization and ongoing recovery are:: to get back home CMS Medicare.gov Compare Post Acute Care list provided to:: Patient Choice offered to / list presented to : Patient  Expected Discharge Plan and Services Expected Discharge Plan: [place] Choice: [place] arrangements for the past 2 months: Single Family Home                           HH Arranged: PT, OT, Speech Therapy, Nurse's Aide HH Agency: [place] Date [facility] Contacted: 11/10/21   Representative spoke with at [place]: [place]  Prior Living Arrangements/Services Living arrangements for the past 2 months: [place] with:: Spouse, Adult Children Patient language and need for interpreter reviewed:: No Do you feel safe going back to the place where you live?: Yes      Need for Family Participation in Patient Care: No (Comment) Care giver support system in place?: Yes (comment)   Criminal Activity/Legal  Involvement Pertinent to Current Situation/Hospitalization: No - Comment as needed  Activities of Daily Living Home Assistive Devices/Equipment: Environmental consultant (specify type), Wheelchair, Radio producer (specify quad or straight) ADL Screening (condition at time of admission) Patient's cognitive ability adequate to safely complete daily activities?: Yes Is the patient deaf or have difficulty hearing?: No Does the patient have difficulty seeing, even when wearing glasses/contacts?: No Does the patient have difficulty concentrating, remembering, or making decisions?: No Patient able to express need for assistance with ADLs?: Yes Does the patient have difficulty dressing or bathing?: Yes Independently performs ADLs?: Yes (appropriate for developmental age) Does the patient have difficulty walking or climbing stairs?: Yes Weakness of Legs: Right Weakness of Arms/Hands: Right  Permission Sought/Granted Permission sought to share information with : Facility Sport and exercise psychologist, Family Supports Permission granted to share information with : Yes, Verbal Permission Granted  Share Information with NAME: Magda Paganini  Permission granted to share info w AGENCY: [facility]  Permission granted to share info w Relationship: Spouse     Emotional Assessment Appearance:: Appears stated age Attitude/Demeanor/Rapport: Engaged Affect (typically observed): Appropriate Orientation: : Oriented to Self, Oriented to Place, Oriented to  Time, Oriented to Situation Alcohol / Substance Use: Not Applicable Psych Involvement: No (comment)  Admission diagnosis:  Acute CVA (cerebrovascular accident) ([place]) [I63.9] Cerebrovascular accident (CVA), unspecified mechanism ([place]) [I63.9] Patient Active Problem List   Diagnosis Date Noted   Acute CVA (cerebrovascular  accident) ([place]) 11/09/2021   Hypertension 11/09/2021   Dyslipidemia 11/09/2021   Hypothyroidism (acquired) 11/09/2021   Class 1 obesity due to excess calories with body mass  index (BMI) of 31.0 to 31.9 in adult 11/09/2021   Chronic pain disorder 11/09/2021   Seizure disorder as sequela of cerebrovascular accident ([place]) 11/09/2021   PCP:  Lilian Coma., MD Pharmacy:   [id number] [place], [id number]: [phone]: [phone]    Social Determinants of Health ([place] Interventions    Readmission Risk Interventions No flowsheet data found.

## 2021-11-10 NOTE — Progress Notes (Signed)
° °  Echocardiogram 2D Echocardiogram has been performed.  Connie Campbell 11/10/2021, 8:29 AM

## 2021-11-10 NOTE — Progress Notes (Signed)
Initial Nutrition Assessment  DOCUMENTATION CODES:   Obesity unspecified  INTERVENTION:  - Encourage adequate PO intake - Recommend diet liberalization from heart healthy/carb mod to carb mod diet; spoke with MD - MVI with minerals daily  NUTRITION DIAGNOSIS:   Increased nutrient needs related to acute illness as evidenced by estimated needs.  GOAL:   Patient will meet greater than or equal to 90% of their needs  MONITOR:   PO intake, Supplement acceptance, Labs, Weight trends, Diet advancement  REASON FOR ASSESSMENT:   Consult Assessment of nutrition requirement/status  ASSESSMENT:   Pt admitted with stroke-like symptoms. PMH includes glaucoma, CVA and HTN.  Pt reports having a good appetite and eating 3 meals per day with no changes in intake. She has several food allergies/intolerances which result in diarrhea (gluten, peanuts, peas, pea protein, soy, melons). She typically enjoys foods such as beans, chicken, fish, and various vegetables as she grows her own vegetables.   Pt reports in 1993 she had surgery in her neck via passage through her mouth. Since then she has had difficulty with swallowing and tries to keep a beverage close by during meals to help with ease of swallowing. Noted per review of chart, pt with chronic dysphagia. SLP following, noted plans for bedside swallow evaluation.  Limited weight history. Pt reports a usual weight around 175 lbs. She denies weight loss but states that her clothes have been fitting more loosely. She also reports starting Ozempic 2 months ago and attributes some weight loss to the addition of this medication as she has no changed her diet.  Medications: levemir 80 units daily, synthroid  Labs: Tgs 166, HgbA1c 8.0%, CBG's 71-252 x24 hours  NUTRITION - FOCUSED PHYSICAL EXAM:  Flowsheet Row Most Recent Value  Orbital Region No depletion  Upper Arm Region No depletion  Thoracic and Lumbar Region No depletion  Buccal Region Mild  depletion  Temple Region Mild depletion  Clavicle Bone Region Mild depletion  Clavicle and Acromion Bone Region No depletion  Scapular Bone Region No depletion  Dorsal Hand No depletion  Patellar Region Mild depletion  Anterior Thigh Region Mild depletion  Posterior Calf Region No depletion  Edema (RD Assessment) None  Hair Reviewed  Eyes Reviewed  Mouth Reviewed  Skin Reviewed  Nails Reviewed       Diet Order:   Diet Order             Diet heart healthy/carb modified Room service appropriate? Yes; Fluid consistency: Thin  Diet effective ____                   EDUCATION NEEDS:   Education needs have been addressed  Skin:  Skin Assessment: Reviewed RN Assessment  Last BM:  2/1  Height:   Ht Readings from Last 1 Encounters:  11/09/21 5' 3"  (1.6 m)    Weight:   Wt Readings from Last 1 Encounters:  11/09/21 83.3 kg    Ideal Body Weight:  52.3 kg  BMI:  Body mass index is 32.53 kg/m.  Estimated Nutritional Needs:   Kcal:  1700-1900  Protein:  85-100g  Fluid:  >/=1.7L  Clayborne Dana, RDN, LDN Clinical Nutrition

## 2021-11-10 NOTE — Progress Notes (Signed)
Lower extremity venous has been completed.   Preliminary results in CV Proc.   Connie Campbell 11/10/2021 3:27 PM

## 2021-11-10 NOTE — Evaluation (Signed)
Speech Language Pathology Evaluation Patient Details Name: Connie Campbell MRN: [id number] DOB: 08-27-1951 Today's Date: 11/10/2021 Time: 9563-8756 SLP Time Calculation (min) (ACUTE ONLY): 27 min  Problem List:  Patient Active Problem List   Diagnosis Date Noted   Acute CVA (cerebrovascular accident) ([place]) 11/09/2021   Hypertension 11/09/2021   Dyslipidemia 11/09/2021   Hypothyroidism (acquired) 11/09/2021   Class 1 obesity due to excess calories with body mass index (BMI) of 31.0 to 31.9 in adult 11/09/2021   Chronic pain disorder 11/09/2021   Seizure disorder as sequela of cerebrovascular accident [facility]) 11/09/2021   Past Medical History:  Past Medical History:  Diagnosis Date   Celiac disease    Class 1 obesity due to excess calories with body mass index (BMI) of 31.0 to 31.9 in adult    Dyslipidemia    Glaucoma    Hypertension    Hypothyroidism (acquired)    Stroke ([place])    Trigeminal neuralgia    Past Surgical History:  Past Surgical History:  Procedure Laterality Date   ABDOMINAL HYSTERECTOMY     APPENDECTOMY     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 female history includes CVA with residual right-sided deficits, trigeminal neuralgia, hypertension, diabetes.   Patient [facility] to [facility] with dizziness and difficulty moving her right arm and right leg more than baseline.  EPP:IRJJO acute posterior left frontal lobe infarcts.   Assessment / Plan / Recommendation Clinical Impression  Pt was seen at bedside for speech/language/cognitive evaluation. Pt reports baseline deficits secondary to dyslexia. SLP administered SLUMS examination to pt. Pt scored 19/30 indicating mild- mod cognitive deficit. Areas of deficit include memory and problem solving. Had increased difficulty with number tasks (I.e., problem-solving and repeating numbers backwards). Pt typically incorporates adaptations at baseline in  order to remember/manage medications and finances. During the session, pt verbalized that clock-drawing task is typically something that she would not have difficulty with. Pt did self-correct on this activity, with adequate wait time allotted but voiced that this was more challenging than what her baseline level of function is. Future SLP services are yielded at this time. Plan to incorporate diagnostic treatment and functional activities to target acute cognitive deficits.    SLP Assessment  SLP Recommendation/Assessment: Patient needs continued Speech [facility] SLP Visit Diagnosis: Cognitive communication deficit (R41.841)    Recommendations for follow up therapy are one component of a multi-disciplinary discharge planning process, led by the attending physician.  Recommendations may be updated based on patient status, additional functional criteria and insurance authorization.    Follow Up Recommendations  Home health SLP    Assistance Recommended at Discharge  Intermittent Supervision/Assistance  Functional Status Assessment Patient has had a recent decline in their functional status and demonstrates the ability to make significant improvements in function in a reasonable and predictable amount of time.  Frequency and Duration min 2x/week  2 weeks      SLP Evaluation Cognition  Overall Cognitive Status: Impaired/Different from baseline Arousal/Alertness: Awake/alert Orientation Level: Oriented to person;Oriented to place;Oriented to situation Year: 2023 Day of Week: Incorrect Attention: Focused Focused Attention: Appears intact Memory: Impaired Memory Impairment: Storage deficit;Decreased recall of new information;Decreased short term memory Decreased Short Term Memory: Verbal basic Awareness: Appears intact Problem Solving: Appears intact Safety/Judgment: Appears intact       Comprehension  Auditory Comprehension Overall Auditory Comprehension: Appears  within functional limits for tasks assessed  Yes/No Questions: Not tested Commands: Not tested Conversation: Complex Interfering Components: Other (comment) (Dyslexia) EffectiveTechniques: Pausing;Repetition Visual Recognition/Discrimination Discrimination: Not tested Reading Comprehension Reading Status: Not tested    Expression Expression Primary Mode of Expression: Verbal Verbal Expression Overall Verbal Expression: Appears within functional limits for tasks assessed Initiation: No impairment Automatic Speech: Name;Social Response Level of Generative/Spontaneous Verbalization: Conversation Repetition:  (Not Tested) Naming: Not tested Pragmatics: No impairment Effective Techniques: Open ended questions Non-Verbal Means of Communication: Not applicable Written Expression Dominant Hand: Right Written Expression: Not tested   Oral / Motor  Oral Motor/Sensory Function Overall Oral Motor/Sensory Function: Within functional limits Motor Speech Overall Motor Speech: Appears within functional limits for tasks assessed Respiration: Within functional limits Phonation: Normal Resonance: Within functional limits Articulation: Within functional limitis Intelligibility: Intelligible Motor Planning: Witnin functional limits Motor Speech Errors: Not applicable Interfering Components: Inadequate dentition            [employer], Student-SLP

## 2021-11-11 DIAGNOSIS — G40909 Epilepsy, unspecified, not intractable, without status epilepticus: Secondary | ICD-10-CM | POA: Diagnosis not present

## 2021-11-11 DIAGNOSIS — I69398 Other sequelae of cerebral infarction: Secondary | ICD-10-CM | POA: Diagnosis not present

## 2021-11-11 DIAGNOSIS — I10 Essential (primary) hypertension: Secondary | ICD-10-CM | POA: Diagnosis not present

## 2021-11-11 DIAGNOSIS — E785 Hyperlipidemia, unspecified: Secondary | ICD-10-CM

## 2021-11-11 DIAGNOSIS — I639 Cerebral infarction, unspecified: Secondary | ICD-10-CM | POA: Diagnosis not present

## 2021-11-11 LAB — CBC WITH DIFFERENTIAL/PLATELET
Abs Immature Granulocytes: 0.02 10*3/uL (ref 0.00–0.07)
Basophils Absolute: 0.1 10*3/uL (ref 0.0–0.1)
Basophils Relative: 1 %
Eosinophils Absolute: 0.4 10*3/uL (ref 0.0–0.5)
Eosinophils Relative: 6 %
HCT: 42 % (ref 36.0–46.0)
Hemoglobin: 13.7 g/dL (ref 12.0–15.0)
Immature Granulocytes: 0 %
Lymphocytes Relative: 27 %
Lymphs Abs: 1.8 10*3/uL (ref 0.7–4.0)
MCH: 28.5 pg (ref 26.0–34.0)
MCHC: 32.6 g/dL (ref 30.0–36.0)
MCV: 87.3 fL (ref 80.0–100.0)
Monocytes Absolute: 0.6 10*3/uL (ref 0.1–1.0)
Monocytes Relative: 9 %
Neutro Abs: 3.8 10*3/uL (ref 1.7–7.7)
Neutrophils Relative %: 57 %
Platelets: 230 10*3/uL (ref 150–400)
RBC: 4.81 MIL/uL (ref 3.87–5.11)
RDW: 13.7 % (ref 11.5–15.5)
WBC: 6.6 10*3/uL (ref 4.0–10.5)
nRBC: 0 % (ref 0.0–0.2)

## 2021-11-11 LAB — BASIC METABOLIC PANEL
Anion gap: 10 (ref 5–15)
BUN: 20 mg/dL (ref 8–23)
CO2: 22 mmol/L (ref 22–32)
Calcium: 9 mg/dL (ref 8.9–10.3)
Chloride: 103 mmol/L (ref 98–111)
Creatinine, Ser: 0.85 mg/dL (ref 0.44–1.00)
GFR, Estimated: >60 mL/min (ref >60)
Glucose, Bld: 158 mg/dL — ABNORMAL HIGH (ref 70–99)
Potassium: 4.1 mmol/L (ref 3.5–5.1)
Sodium: 135 mmol/L (ref 135–145)

## 2021-11-11 LAB — GLUCOSE, CAPILLARY
Glucose-Capillary: 110 mg/dL — ABNORMAL HIGH (ref 70–99)
Glucose-Capillary: 91 mg/dL (ref 70–99)
Glucose-Capillary: 154 mg/dL — ABNORMAL HIGH (ref 70–99)
Glucose-Capillary: 183 mg/dL — ABNORMAL HIGH (ref 70–99)
Glucose-Capillary: 207 mg/dL — ABNORMAL HIGH (ref 70–99)

## 2021-11-11 MED ORDER — POLYVINYL ALCOHOL 1.4 % OP SOLN
1.0000 [drp] | OPHTHALMIC | Status: DC | PRN
  Filled 2021-11-11: qty 15

## 2021-11-11 NOTE — Plan of Care (Signed)
°  Problem: Education: Goal: Knowledge of disease or condition will improve 11/11/2021 0047 by Brooke Bonito, RN Outcome: Progressing 11/11/2021 0046 by Brooke Bonito, RN Outcome: Progressing Goal: Knowledge of secondary prevention will improve (SELECT ALL) 11/11/2021 0047 by Brooke Bonito, RN Outcome: Progressing 11/11/2021 0046 by Brooke Bonito, RN Outcome: Progressing Goal: Knowledge of patient specific risk factors will improve (INDIVIDUALIZE FOR PATIENT) 11/11/2021 0047 by Brooke Bonito, RN Outcome: Progressing 11/11/2021 0046 by Brooke Bonito, RN Outcome: Progressing Goal: Individualized Educational Video(s) 11/11/2021 0047 by Brooke Bonito, RN Outcome: Progressing 11/11/2021 0046 by Brooke Bonito, RN Outcome: Progressing   Problem: Coping: Goal: Will verbalize positive feelings about self 11/11/2021 0047 by Brooke Bonito, RN Outcome: Progressing 11/11/2021 0046 by Brooke Bonito, RN Outcome: Progressing Goal: Will identify appropriate support needs 11/11/2021 0047 by Brooke Bonito, RN Outcome: Progressing 11/11/2021 0046 by Brooke Bonito, RN Outcome: Progressing   Problem: Health Behavior/Discharge Planning: Goal: Ability to manage health-related needs will improve 11/11/2021 0047 by Brooke Bonito, RN Outcome: Progressing 11/11/2021 0046 by Brooke Bonito, RN Outcome: Progressing   Problem: Self-Care: Goal: Ability to participate in self-care as condition permits will improve 11/11/2021 0047 by Brooke Bonito, RN Outcome: Progressing 11/11/2021 0046 by Brooke Bonito, RN Outcome: Progressing Goal: Verbalization of feelings and concerns over difficulty with self-care will improve 11/11/2021 0047 by Brooke Bonito, RN Outcome: Progressing 11/11/2021 0046 by Brooke Bonito, RN Outcome: Progressing Goal: Ability to communicate needs accurately will improve 11/11/2021 0047 by Brooke Bonito, RN Outcome: Progressing 11/11/2021 0046 by Brooke Bonito, RN Outcome: Progressing   Problem: Nutrition: Goal:  Risk of aspiration will decrease 11/11/2021 0047 by Brooke Bonito, RN Outcome: Progressing 11/11/2021 0046 by Brooke Bonito, RN Outcome: Progressing Goal: Dietary intake will improve 11/11/2021 0047 by Brooke Bonito, RN Outcome: Progressing 11/11/2021 0046 by Brooke Bonito, RN Outcome: Progressing   Problem: Ischemic Stroke/TIA Tissue Perfusion: Goal: Complications of ischemic stroke/TIA will be minimized 11/11/2021 0047 by Brooke Bonito, RN Outcome: Progressing 11/11/2021 0046 by Brooke Bonito, RN Outcome: Progressing   Problem: Education: Goal: Knowledge of General Education information will improve Description: Including pain rating scale, medication(s)/side effects and non-pharmacologic comfort measures Outcome: Progressing   Problem: Health Behavior/Discharge Planning: Goal: Ability to manage health-related needs will improve Outcome: Progressing   Problem: Clinical Measurements: Goal: Ability to maintain clinical measurements within normal limits will improve Outcome: Progressing Goal: Will remain free from infection Outcome: Progressing Goal: Diagnostic test results will improve Outcome: Progressing Goal: Respiratory complications will improve Outcome: Progressing Goal: Cardiovascular complication will be avoided Outcome: Progressing   Problem: Activity: Goal: Risk for activity intolerance will decrease Outcome: Progressing   Problem: Nutrition: Goal: Adequate nutrition will be maintained Outcome: Progressing   Problem: Coping: Goal: Level of anxiety will decrease Outcome: Progressing   Problem: Elimination: Goal: Will not experience complications related to bowel motility Outcome: Progressing Goal: Will not experience complications related to urinary retention Outcome: Progressing   Problem: Pain Managment: Goal: General experience of comfort will improve Outcome: Progressing   Problem: Safety: Goal: Ability to remain free from injury will improve Outcome:  Progressing   Problem: Skin Integrity: Goal: Risk for impaired skin integrity will decrease Outcome: Progressing

## 2021-11-11 NOTE — Progress Notes (Signed)
PROGRESS NOTE  Connie Campbell VZD:[id number] DOB: 1951-06-19 DOA: 11/09/2021 PCP: Connie Coma., MD  HPI/Recap of past 24 hours: Connie Campbell is a 71 y.o. female with medical history significant of glaucoma, CVA and HTN presenting inability to walk, difficulty getting out of bed and dizziness that started 11/07/21. Pt was unable to lift her R leg and was shuffling. Of note, her first stroke led to R-sided deficits and her second stroke led to speech and cognitive issues. Her speech is unchanged from baseline.  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 In the ED, MRI showed acute CVA. Neurology consulted.  Patient admitted for further management.     Today, patient reported some dizziness, and headache.  Met patient sitting up in chair.  Patient denies any worsening weakness, has slurred speech is at baseline, denies any chest pain, shortness of breath, abdominal pain, nausea/vomiting, fever/chills.   Assessment/Plan: Principal Problem:   Acute CVA (cerebrovascular accident) ([place]) Active Problems:   Hypertension   Dyslipidemia   Hypothyroidism (acquired)   Class 1 obesity due to excess calories with body mass index (BMI) of 31.0 to 31.9 in adult   Chronic pain disorder   Seizure disorder as sequela of cerebrovascular accident ([place])   Small acute left frontal lobe infarct  Embolic versus cardiogenic source MRI showed above, MRA no large vessel occlusion Carotid Doppler with bilateral ICA with 1 to 39% stenosis Echo with EF of 60 to 65%, aortic valve sclerosis without stenosis Lower extremity Doppler negative for DVT LDL 90, A1c 8 Neurology consulted, plan for aspirin and Plavix x3 weeks, then Plavix alone.  Recommend loop recorder upon discharge to rule out underlying paroxysmal A. Fib, cardiology/EP consulted PT/OT/ST-recommend CIR   HTN Allow permissive HTN for now Hold ARB and plan to restart in 48-72 hours   HLD LDL 90 Lipitor 40 mg daily (pt reports it  worsens her dystonia the last time she took it, but will attempt taking again)   DM type 2, uncontrolled A1c 8 Hold home PO medications (Glucophage, Jardiance) SSI, levemir, Accu-Cheks, hypoglycemic protocol   Seizure disorder as sequela of cerebrovascular accident  Continue Keppra   Chronic pain disorder Continue Percocet, Baclofen   Obesity Lifestyle modification advised   Hypothyroidism (acquired) Continue Synthroid        Malnutrition Type:  Nutrition Problem: Increased nutrient needs Etiology: acute illness   Malnutrition Characteristics:  Signs/Symptoms: estimated needs   Nutrition Interventions:  Interventions: MVI    Estimated body mass index is 32.53 kg/m as calculated from the following:   Height as of this encounter: 5' 3"  (1.6 m).   Weight as of this encounter: 83.3 kg.     Code Status: Full  Family Communication: None at bedside  Disposition Plan: Status is: Inpatient Remains inpatient appropriate because: Level of care    Consultants: Neurology  Procedures: None  Antimicrobials: None  DVT prophylaxis: Lovenox   Objective: Vitals:   11/11/21 0350 11/11/21 0749 11/11/21 1142 11/11/21 1515  BP: (!) 114/57 140/70 (!) 142/77 131/71  Pulse: 71 74 76 76  Resp: 20 16 16 16   Temp: 98 F (36.7 C) 97.6 F (36.4 C) 97.7 F (36.5 C) 98.3 F (36.8 C)  TempSrc: Oral Axillary Oral Oral  SpO2: 94% 100% 98% 98%  Weight:      Height:        Intake/Output Summary (Last 24 hours) at 11/11/2021 1815 Last data filed at 11/11/2021 1237 Gross per 24 hour  Intake 426 ml  Output --  Net 426 ml   Filed Weights   11/09/21 0752 11/09/21 1502  Weight: 85.1 kg 83.3 kg    Exam: General: NAD, poor dentition  Cardiovascular: S1, S2 present Respiratory: CTAB Abdomen: Soft, nontender, nondistended, bowel sounds present Musculoskeletal: No bilateral pedal edema noted Skin: Normal Psychiatry: Normal mood  Neurology: 4/5 strength in LLE and  LUE, 3/5 in RLE and 4/5 in RUE    Data Reviewed: CBC: Recent Labs  Lab 11/09/21 0800 11/11/21 0239  WBC 8.0 6.6  NEUTROABS 5.3 3.8  HGB 14.6 13.7  HCT 44.6 42.0  MCV 88.7 87.3  PLT 251 657   Basic Metabolic Panel: Recent Labs  Lab 11/09/21 0800 11/11/21 0239  NA 138 135  K 4.3 4.1  CL 108 103  CO2 21* 22  GLUCOSE 161* 158*  BUN 22 20  CREATININE 0.83 0.85  CALCIUM 8.5* 9.0   GFR: Estimated Creatinine Clearance: 63.9 mL/min (by C-G formula based on SCr of 0.85 mg/dL). Liver Function Tests: Recent Labs  Lab 11/09/21 0800  AST 21  ALT 24  ALKPHOS 82  BILITOT 0.4  PROT 6.6  ALBUMIN 3.2*   No results for input(s): LIPASE, AMYLASE in the last 168 hours. No results for input(s): AMMONIA in the last 168 hours. Coagulation Profile: Recent Labs  Lab 11/09/21 0800  INR 1.0   Cardiac Enzymes: No results for input(s): CKTOTAL, CKMB, CKMBINDEX, TROPONINI in the last 168 hours. BNP (last 3 results) No results for input(s): PROBNP in the last 8760 hours. HbA1C: Recent Labs    11/10/21 0429  HGBA1C 8.0*   CBG: Recent Labs  Lab 11/10/21 2150 11/11/21 0547 11/11/21 0731 11/11/21 1155 11/11/21 1605  GLUCAP 149* 110* 91 154* 183*   Lipid Profile: Recent Labs    11/10/21 0429  CHOL 162  HDL 39*  LDLCALC 90  TRIG 166*  CHOLHDL 4.2   Thyroid Function Tests: No results for input(s): TSH, T4TOTAL, FREET4, T3FREE, THYROIDAB in the last 72 hours. Anemia Panel: No results for input(s): VITAMINB12, FOLATE, FERRITIN, TIBC, IRON, RETICCTPCT in the last 72 hours. Urine analysis:    Component Value Date/Time   COLORURINE YELLOW 11/09/2021 0801   APPEARANCEUR CLEAR 11/09/2021 0801   LABSPEC 1.025 11/09/2021 0801   PHURINE 5.5 11/09/2021 0801   GLUCOSEU >=500 (A) 11/09/2021 0801   HGBUR NEGATIVE 11/09/2021 0801   BILIRUBINUR NEGATIVE 11/09/2021 0801   KETONESUR NEGATIVE 11/09/2021 0801   PROTEINUR NEGATIVE 11/09/2021 0801   NITRITE NEGATIVE 11/09/2021  0801   LEUKOCYTESUR NEGATIVE 11/09/2021 0801   Sepsis Labs: @LABRCNTIP (procalcitonin:4,lacticidven:4)  ) Recent Results (from the past 240 hour(s))  Resp Panel by RT-PCR (Flu A&B, Covid) Nasopharyngeal Swab     Status: None   Collection Time: 11/09/21  8:00 AM   Specimen: Nasopharyngeal Swab; Nasopharyngeal(NP) swabs in vial transport medium  Result Value Ref Range Status   SARS Coronavirus 2 by RT PCR NEGATIVE NEGATIVE Final    Comment: (NOTE) SARS-CoV-2 target nucleic acids are NOT DETECTED.  The SARS-CoV-2 RNA is generally detectable in upper respiratory specimens during the acute phase of infection. The lowest concentration of SARS-CoV-2 viral copies this assay can detect is 138 copies/mL. A negative result does not preclude SARS-Cov-2 infection and should not be used as the sole basis for treatment or other patient management decisions. A negative result may occur with  improper specimen collection/handling, submission of specimen other than nasopharyngeal swab, presence of viral mutation(s) within the areas targeted by this assay, and inadequate number  of viral copies(<138 copies/mL). A negative result must be combined with clinical observations, patient history, and epidemiological information. The expected result is Negative.  Fact Sheet for Patients:  EntrepreneurPulse.com.au  Fact Sheet for Healthcare Providers:  IncredibleEmployment.be  This test is no t yet approved or cleared by the [place] FDA and  has been authorized for detection and/or diagnosis of SARS-CoV-2 by FDA under an Emergency Use Authorization (EUA). This EUA will remain  in effect (meaning this test can be used) for the duration of the COVID-19 declaration under Section 564(b)(1) of the Act, 21 U.S.C.section 360bbb-3(b)(1), unless the authorization is terminated  or revoked sooner.       Influenza A by PCR NEGATIVE NEGATIVE Final   Influenza B by PCR  NEGATIVE NEGATIVE Final    Comment: (NOTE) The Xpert Xpress SARS-CoV-2/FLU/RSV plus assay is intended as an aid in the diagnosis of influenza from Nasopharyngeal swab specimens and should not be used as a sole basis for treatment. Nasal washings and aspirates are unacceptable for Xpert Xpress SARS-CoV-2/FLU/RSV testing.  Fact Sheet for Patients: EntrepreneurPulse.com.au  Fact Sheet for Healthcare Providers: IncredibleEmployment.be  This test is not yet approved or cleared by the [place] FDA and has been authorized for detection and/or diagnosis of SARS-CoV-2 by FDA under an Emergency Use Authorization (EUA). This EUA will remain in effect (meaning this test can be used) for the duration of the COVID-19 declaration under Section 564(b)(1) of the Act, 21 U.S.C. section 360bbb-3(b)(1), unless the authorization is terminated or revoked.  Performed at [facility], [place] [address], [place], [place] [address]       Studies: No results found.  Scheduled Meds:  aspirin EC  81 mg Oral Daily   atorvastatin  40 mg Oral Daily   clopidogrel  75 mg Oral Daily   enoxaparin (LOVENOX) injection  40 mg Subcutaneous Daily   insulin aspart  0-15 Units Subcutaneous TID WC   insulin detemir  60 Units Subcutaneous QHS   levETIRAcetam  750 mg Oral BID   levothyroxine  100 mcg Oral QAC breakfast   multivitamin with minerals  1 tablet Oral Daily   timolol  1 drop Right Eye BID    Continuous Infusions:     LOS: 2 days     Alma Friendly, MD [facility]  If 7PM-7AM, please contact night-coverage [email or web address] 11/11/2021, 6:15 PM

## 2021-11-11 NOTE — Plan of Care (Signed)
°  Problem: Education: Goal: Knowledge of disease or condition will improve Outcome: Progressing Goal: Knowledge of secondary prevention will improve (SELECT ALL) Outcome: Progressing Goal: Knowledge of patient specific risk factors will improve (INDIVIDUALIZE FOR PATIENT) Outcome: Progressing Goal: Individualized Educational Video(s) Outcome: Progressing   Problem: Coping: Goal: Will verbalize positive feelings about self Outcome: Progressing Goal: Will identify appropriate support needs Outcome: Progressing   Problem: Health Behavior/Discharge Planning: Goal: Ability to manage health-related needs will improve Outcome: Progressing   Problem: Self-Care: Goal: Ability to participate in self-care as condition permits will improve Outcome: Progressing Goal: Verbalization of feelings and concerns over difficulty with self-care will improve Outcome: Progressing Goal: Ability to communicate needs accurately will improve Outcome: Progressing   Problem: Nutrition: Goal: Risk of aspiration will decrease Outcome: Progressing Goal: Dietary intake will improve Outcome: Progressing   Problem: Ischemic Stroke/TIA Tissue Perfusion: Goal: Complications of ischemic stroke/TIA will be minimized Outcome: Progressing   Problem: Education: Goal: Knowledge of General Education information will improve Description: Including pain rating scale, medication(s)/side effects and non-pharmacologic comfort measures Outcome: Progressing   Problem: Health Behavior/Discharge Planning: Goal: Ability to manage health-related needs will improve Outcome: Progressing   Problem: Clinical Measurements: Goal: Ability to maintain clinical measurements within normal limits will improve Outcome: Progressing Goal: Will remain free from infection Outcome: Progressing Goal: Diagnostic test results will improve Outcome: Progressing Goal: Respiratory complications will improve Outcome: Progressing Goal:  Cardiovascular complication will be avoided Outcome: Progressing   Problem: Activity: Goal: Risk for activity intolerance will decrease Outcome: Progressing   Problem: Nutrition: Goal: Adequate nutrition will be maintained Outcome: Progressing   Problem: Coping: Goal: Level of anxiety will decrease Outcome: Progressing   Problem: Elimination: Goal: Will not experience complications related to bowel motility Outcome: Progressing Goal: Will not experience complications related to urinary retention Outcome: Progressing   Problem: Pain Managment: Goal: General experience of comfort will improve Outcome: Progressing   Problem: Safety: Goal: Ability to remain free from injury will improve Outcome: Progressing   Problem: Skin Integrity: Goal: Risk for impaired skin integrity will decrease Outcome: Progressing

## 2021-11-11 NOTE — Plan of Care (Signed)
°  Problem: Education: Goal: Knowledge of disease or condition will improve Outcome: Progressing Goal: Knowledge of secondary prevention will improve (SELECT ALL) Outcome: Progressing Goal: Knowledge of patient specific risk factors will improve (INDIVIDUALIZE FOR PATIENT) Outcome: Progressing Goal: Individualized Educational Video(s) Outcome: Progressing   Problem: Coping: Goal: Will verbalize positive feelings about self Outcome: Progressing Goal: Will identify appropriate support needs Outcome: Progressing   Problem: Health Behavior/Discharge Planning: Goal: Ability to manage health-related needs will improve Outcome: Progressing   Problem: Self-Care: Goal: Ability to participate in self-care as condition permits will improve Outcome: Progressing Goal: Verbalization of feelings and concerns over difficulty with self-care will improve Outcome: Progressing Goal: Ability to communicate needs accurately will improve Outcome: Progressing   Problem: Nutrition: Goal: Risk of aspiration will decrease Outcome: Progressing Goal: Dietary intake will improve Outcome: Progressing   Problem: Ischemic Stroke/TIA Tissue Perfusion: Goal: Complications of ischemic stroke/TIA will be minimized Outcome: Progressing

## 2021-11-11 NOTE — Progress Notes (Addendum)
STROKE TEAM PROGRESS NOTE   INTERVAL HISTORY No family at the bedside, patient is up in the chair. On exam she still has mild right leg weakness but it is improving, slightly weaker hand grasp, but improved from yesterday. She does report a little bit of dizziness today and a slight headache, PRN medications were able to relieve this.   Patient reports that at the time of onset of sx, she was dizzy, couldn't walk appropriately 2/2 weakness of her LLE. Has had 2 strokes in the past and has had pinched nerves. Describes neuropathy as feeling like a mallet hitting her in the leg, and says that this is not how her current weakness felt. 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 No history of afib, palpitations. History of blood clots in legs. Was previously on coumadin. MRI with acute L ACA infarct.  Vitals:   11/10/21 2031 11/11/21 0003 11/11/21 0350 11/11/21 0749  BP: (!) 143/76 (!) 120/55 (!) 114/57 140/70  Pulse: 85 72 71 74  Resp: 16 16 20 16   Temp: (!) 97.2 F (36.2 C) (!) 97.5 F (36.4 C) 98 F (36.7 C) 97.6 F (36.4 C)  TempSrc: Oral Oral Oral Axillary  SpO2: 95% 97% 94% 100%  Weight:      Height:       CBC:  Recent Labs  Lab 11/09/21 0800 11/11/21 0239  WBC 8.0 6.6  NEUTROABS 5.3 3.8  HGB 14.6 13.7  HCT 44.6 42.0  MCV 88.7 87.3  PLT 251 751    Basic Metabolic Panel:  Recent Labs  Lab 11/09/21 0800 11/11/21 0239  NA 138 135  K 4.3 4.1  CL 108 103  CO2 21* 22  GLUCOSE 161* 158*  BUN 22 20  CREATININE 0.83 0.85  CALCIUM 8.5* 9.0    Lipid Panel:  Recent Labs  Lab 11/10/21 0429  CHOL 162  TRIG 166*  HDL 39*  CHOLHDL 4.2  VLDL 33  LDLCALC 90    HgbA1c:  Recent Labs  Lab 11/10/21 0429  HGBA1C 8.0*    Urine Drug Screen:  Recent Labs  Lab 11/09/21 0845  LABOPIA POSITIVE*  COCAINSCRNUR NONE DETECTED  LABBENZ NONE DETECTED  AMPHETMU NONE DETECTED  THCU NONE DETECTED  LABBARB NONE DETECTED      Alcohol Level  Recent Labs  Lab 11/09/21 0800  ETH <10     IMAGING past 24 hours VAS [place] LOWER EXTREMITY VENOUS (DVT)  Result Date: 11/10/2021  Lower Venous DVT Study Patient Name:  GIZELL DANSER  Date of Exam:   11/10/2021 Medical Rec #: [id number]     Accession #:    [id number] Date of Birth: 07-Dec-1970     Patient Gender: F Patient Age:   71 years Exam Location:  [facility] Procedure:      VAS [place] LOWER EXTREMITY VENOUS (DVT) Referring Phys: [facility] EZENDUKA --------------------------------------------------------------------------------  Indications: Stroke.  Comparison Study: no prior Performing Technologist: Archie Patten RVS  Examination Guidelines: A complete evaluation includes B-mode imaging, spectral Doppler, color Doppler, and power Doppler as needed of all accessible portions of each vessel. Bilateral testing is considered an integral part of a complete examination. Limited examinations for reoccurring indications may be performed as noted. The reflux portion of the exam is performed with the patient in reverse Trendelenburg.  +---------+---------------+---------+-----------+----------+--------------+  RIGHT     Compressibility Phasicity Spontaneity Properties Thrombus Aging  +---------+---------------+---------+-----------+----------+--------------+  CFV       Full  Yes       Yes                                    +---------+---------------+---------+-----------+----------+--------------+  SFJ       Full                                                             +---------+---------------+---------+-----------+----------+--------------+  FV Prox   Full                                                             +---------+---------------+---------+-----------+----------+--------------+  FV Mid    Full                                                             +---------+---------------+---------+-----------+----------+--------------+  FV Distal Full                                                              +---------+---------------+---------+-----------+----------+--------------+  PFV       Full                                                             +---------+---------------+---------+-----------+----------+--------------+  POP       Full            Yes       Yes                                    +---------+---------------+---------+-----------+----------+--------------+  PTV       Full                                                             +---------+---------------+---------+-----------+----------+--------------+  PERO      Full                                                             +---------+---------------+---------+-----------+----------+--------------+   +---------+---------------+---------+-----------+----------+-------------------+  LEFT      Compressibility Phasicity  Spontaneity Properties Thrombus Aging       +---------+---------------+---------+-----------+----------+-------------------+  CFV       Full            Yes       Yes                                         +---------+---------------+---------+-----------+----------+-------------------+  SFJ       Full                                                                  +---------+---------------+---------+-----------+----------+-------------------+  FV Prox   Full                                                                  +---------+---------------+---------+-----------+----------+-------------------+  FV Mid    Full                                                                  +---------+---------------+---------+-----------+----------+-------------------+  FV Distal Full                                                                  +---------+---------------+---------+-----------+----------+-------------------+  PFV       Full                                                                  +---------+---------------+---------+-----------+----------+-------------------+  POP        Full            Yes       Yes                                         +---------+---------------+---------+-----------+----------+-------------------+  PTV       Full                                                                  +---------+---------------+---------+-----------+----------+-------------------+  PERO  Not well visualized  +---------+---------------+---------+-----------+----------+-------------------+     Summary: BILATERAL: - No evidence of deep vein thrombosis seen in the lower extremities, bilaterally. -No evidence of popliteal cyst, bilaterally.   *See table(s) above for measurements and observations.    Preliminary     PHYSICAL EXAM  Physical Exam  Constitutional: Appears well-developed and well-nourished pleasant elderly Caucasian lady.  Psych: Affect appropriate to situation Eyes: No scleral injection HENT: No OP obstrucion MSK: no joint deformities.  Cardiovascular: Normal rate and regular rhythm.  Respiratory: Effort normal, non-labored breathing GI: Soft.  No distension. There is no tenderness.  Skin: WDI  Neuro: Mental Status: Patient is awake, alert, oriented to person, place, month, year, and situation. Patient is able to give a clear and coherent history. No signs of neglect but patient has baseline dysarthria and mild expressive aphasia Cranial Nerves: II: Visual Fields are full. Pupils are equal, round, and reactive to light.   III,IV, VI: EOMI without ptosis or diploplia.  V: Facial sensation is symmetric to light touch VII: Facial movement is symmetric resting and smiling VIII: Hearing is intact to voice X: Palate elevates symmetrically XI: Shoulder shrug is symmetric. XII: Tongue protrudes midline without atrophy or fasciculations.  Motor: Tone is normal. Bulk is normal. 4/5 strength in LLE and LUE but 3/5 in RLE and  4/5 in RUE. Sensory: Sensation is symmetric to light touch in the arms but R>L  in the legs. No extinction to DSS present.  Cerebellar: FNF intact bilaterally   ASSESSMENT/PLAN Ms. Connie Campbell is a 71 y.o. 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 Plan for loop recorder prior to rehab placement.   Stroke: left small ACA infarct, embolic pattern, source unclear MRI  Small acute L frontal lobe infarct in the distribution of ACA; multiple chronic areas of ischemia in cerebrum and cerebellum MRA  No large vessel occlusion Carotid Doppler: Bilateral ICA with 1-39% stenosis 2D Echo: LVEF 60-65%, Trivial MVR and AVR. Aortic valve sclerosis without stenosis.  LE Doppler no evidence of DVT Recommend loop recorder placement to rule out A-fib before discharge LDL 90 HgbA1c 8.0 VTE prophylaxis - SCDs No antithrombotic and previously on aspirin but d/c 2/2 h/o gastritis  prior to admission, now on  enteric coated aspirin 81 mg and plavix 75 mg x 3 weeks, then plavix alone .  Therapy recommendations:  CIR Disposition:  pending  Hypertension Home meds:  Telmisartan 40 mg daily Stable Long-term BP goal normotensive  Hyperlipidemia Home meds:  N/A LDL 90, goal < 70 Added Atorvastatin 40 mg  Continue statin at discharge  Diabetes type II Uncontrolled Home meds:  Levemir 80u qHS, Jardiance 25 mg, Metformin 850 mg TID, Ozempic 0.5 mg weekly on Wednesdays HgbA1c 8.0, goal < 7.0 CBGs SSI  continue home Levemir Close PCP follow-up for better DM control  Other Stroke Risk Factors Advanced Age >/= 69  Obesity, Body mass index is 32.53 kg/m., BMI >/= 30 associated with increased stroke risk, recommend weight loss, diet and exercise as appropriate  Hx stroke/TIA 6 years ago- memory deficits, dystonia, seizures - on home Keppra 8 years ago- neuropathy  Other Active Problems Hypothyroidism 2/2  Hashimoto thyroiditis Continue home Synthroid 100 mcg daily  Patient seen and examined by NP/APP with MD. MD to update note as needed.   Janine Ores, DNP, FNP-BC [facility] Pager: [phone]  ATTENDING NOTE: I reviewed above note and agree with the assessment and plan. Pt was seen and examined.   71 year old female with history of hypertension, diabetes, bilateral DVTs, trigeminal neuralgia, Hashimoto thyroiditis, stroke and status epilepticus on Keppra admitted for right leg weakness.  MRI showed left ACA small infarct, MRI and carotid Doppler unremarkable.  EF 60 to 65%, DVT negative.  LDL 90, A1c 8.0.  UDS negative.  Creatinine 0.83.  Etiology for patient stroke not quite clear, however embolic pattern, concerning for cardioembolic source.  Recommend loop recorder placement before discharge to CIR.  Continue aspirin EC 81 and Plavix 75 DAPT for 3 weeks and then Plavix alone.  Continue Lipitor 40.  Aggressive risk factor modification.  Continue home [place].  PT/OT recommended CIR.  For detailed assessment and plan, please refer to above as I have made changes wherever appropriate.   Neurology will sign off. Please call with questions. Pt will follow up with stroke clinic NP at [facility] in about 4 weeks. Thanks for the consult.   Rosalin Hawking, MD PhD Stroke Neurology 11/11/2021 3:26 PM    To contact Stroke Continuity provider, please refer to [email or web address] After hours, contact General Neurology

## 2021-11-11 NOTE — Progress Notes (Signed)
Inpatient Rehab Admissions Coordinator:   I met with Pt. And family at bedside to discuss potential CIR admit. They state interest and confirm that Pt. Will have 24/7 support at d/c. I will open a case with her insurance and pursue for admission.  Clemens Catholic, [place], [place] Admissions Coordinator  [phone] ([place]) [phone] (office)

## 2021-11-12 DIAGNOSIS — E785 Hyperlipidemia, unspecified: Secondary | ICD-10-CM | POA: Diagnosis not present

## 2021-11-12 DIAGNOSIS — I639 Cerebral infarction, unspecified: Secondary | ICD-10-CM

## 2021-11-12 DIAGNOSIS — G40909 Epilepsy, unspecified, not intractable, without status epilepticus: Secondary | ICD-10-CM | POA: Diagnosis not present

## 2021-11-12 DIAGNOSIS — I69398 Other sequelae of cerebral infarction: Secondary | ICD-10-CM | POA: Diagnosis not present

## 2021-11-12 DIAGNOSIS — I10 Essential (primary) hypertension: Secondary | ICD-10-CM | POA: Diagnosis not present

## 2021-11-12 LAB — CBC WITH DIFFERENTIAL/PLATELET
Abs Immature Granulocytes: 0.03 10*3/uL (ref 0.00–0.07)
Basophils Absolute: 0.1 10*3/uL (ref 0.0–0.1)
Basophils Relative: 1 %
Eosinophils Absolute: 0.4 10*3/uL (ref 0.0–0.5)
Eosinophils Relative: 6 %
HCT: 43.5 % (ref 36.0–46.0)
Hemoglobin: 14.1 g/dL (ref 12.0–15.0)
Immature Granulocytes: 0 %
Lymphocytes Relative: 29 %
Lymphs Abs: 2.1 10*3/uL (ref 0.7–4.0)
MCH: 28.2 pg (ref 26.0–34.0)
MCHC: 32.4 g/dL (ref 30.0–36.0)
MCV: 87 fL (ref 80.0–100.0)
Monocytes Absolute: 0.8 10*3/uL (ref 0.1–1.0)
Monocytes Relative: 11 %
Neutro Abs: 3.9 10*3/uL (ref 1.7–7.7)
Neutrophils Relative %: 53 %
Platelets: 252 10*3/uL (ref 150–400)
RBC: 5 MIL/uL (ref 3.87–5.11)
RDW: 13.7 % (ref 11.5–15.5)
WBC: 7.3 10*3/uL (ref 4.0–10.5)
nRBC: 0 % (ref 0.0–0.2)

## 2021-11-12 LAB — BASIC METABOLIC PANEL
Anion gap: 11 (ref 5–15)
BUN: 21 mg/dL (ref 8–23)
CO2: 24 mmol/L (ref 22–32)
Calcium: 9.1 mg/dL (ref 8.9–10.3)
Chloride: 101 mmol/L (ref 98–111)
Creatinine, Ser: 0.85 mg/dL (ref 0.44–1.00)
GFR, Estimated: >60 mL/min (ref >60)
Glucose, Bld: 177 mg/dL — ABNORMAL HIGH (ref 70–99)
Potassium: 4.4 mmol/L (ref 3.5–5.1)
Sodium: 136 mmol/L (ref 135–145)

## 2021-11-12 LAB — GLUCOSE, CAPILLARY
Glucose-Capillary: 138 mg/dL — ABNORMAL HIGH (ref 70–99)
Glucose-Capillary: 218 mg/dL — ABNORMAL HIGH (ref 70–99)
Glucose-Capillary: 123 mg/dL — ABNORMAL HIGH (ref 70–99)
Glucose-Capillary: 193 mg/dL — ABNORMAL HIGH (ref 70–99)

## 2021-11-12 MED ORDER — IRBESARTAN 150 MG PO TABS
75.0000 mg | ORAL_TABLET | Freq: Every day | ORAL | Status: DC
  Administered 2021-11-12 – 2021-11-15 (×4): 75 mg via ORAL
  Filled 2021-11-12 (×4): qty 1

## 2021-11-12 MED ORDER — IRBESARTAN 150 MG PO TABS: 150.0000 mg | ORAL_TABLET | Freq: Every day | ORAL | Status: DC

## 2021-11-12 NOTE — Progress Notes (Signed)
PROGRESS NOTE  Connie Campbell QQV:[id number] DOB: 02/14/1951 DOA: 11/09/2021 PCP: Lilian Coma., MD  HPI/Recap of past 24 hours: Connie Campbell is a 71 y.o. female with medical history significant of glaucoma, CVA and HTN presenting inability to walk, difficulty getting out of bed and dizziness that started 11/07/21. Pt was unable to lift her R leg and was shuffling. Of note, her first stroke led to R-sided deficits and her second stroke led to speech and cognitive issues. Her speech is unchanged from baseline.  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 In the ED, MRI showed acute CVA. Neurology consulted.  Patient admitted for further management.     Today, patient denies any new complaints.  Reports generalized chronic pain.   Assessment/Plan: Principal Problem:   Acute CVA (cerebrovascular accident) ([place]) Active Problems:   Hypertension   Dyslipidemia   Hypothyroidism (acquired)   Class 1 obesity due to excess calories with body mass index (BMI) of 31.0 to 31.9 in adult   Chronic pain disorder   Seizure disorder as sequela of cerebrovascular accident ([place])   Small acute left frontal lobe infarct  Embolic versus cardiogenic source MRI showed above, MRA no large vessel occlusion Carotid Doppler with bilateral ICA with 1 to 39% stenosis Echo with EF of 60 to 65%, aortic valve sclerosis without stenosis Lower extremity Doppler negative for DVT LDL 90, A1c 8 Neurology consulted, plan for aspirin and Plavix x3 weeks, then Plavix alone.  Recommend loop recorder upon discharge to rule out underlying paroxysmal A. Fib, cardiology/EP consulted PT/OT/ST-recommend CIR   HTN BP stable Restart BP med   HLD LDL 90 Lipitor 40 mg daily (pt reports it worsens her dystonia the last time she took it, but will attempt taking again)   DM type 2, uncontrolled A1c 8 Hold home PO medications (Glucophage, Jardiance) SSI, levemir, Accu-Cheks, hypoglycemic protocol   Seizure  disorder as sequela of cerebrovascular accident  Continue Keppra   Chronic pain disorder Continue Percocet, Baclofen   Obesity Lifestyle modification advised   Hypothyroidism (acquired) Continue Synthroid        Malnutrition Type:  Nutrition Problem: Increased nutrient needs Etiology: acute illness   Malnutrition Characteristics:  Signs/Symptoms: estimated needs   Nutrition Interventions:  Interventions: MVI    Estimated body mass index is 32.53 kg/m as calculated from the following:   Height as of this encounter: 5' 3"  (1.6 m).   Weight as of this encounter: 83.3 kg.     Code Status: Full  Family Communication: None at bedside  Disposition Plan: Status is: Inpatient Remains inpatient appropriate because: Level of care    Consultants: Neurology  Procedures: None  Antimicrobials: None  DVT prophylaxis: Lovenox   Objective: Vitals:   11/12/21 0345 11/12/21 0731 11/12/21 1146 11/12/21 1146  BP: (!) 119/58 112/78 130/79 130/79  Pulse: 70 72 77 77  Resp: 16 17 17 17   Temp: 98.6 F (37 C) 97.6 F (36.4 C) 97.9 F (36.6 C) 97.9 F (36.6 C)  TempSrc: Oral Oral Oral Oral  SpO2: 95% 96% 95% 96%  Weight:      Height:        Intake/Output Summary (Last 24 hours) at 11/12/2021 1552 Last data filed at 11/11/2021 2157 Gross per 24 hour  Intake --  Output 0 ml  Net 0 ml   Filed Weights   11/09/21 0752 11/09/21 1502  Weight: 85.1 kg 83.3 kg    Exam: General: NAD, poor dentition  Cardiovascular: S1, S2 present Respiratory:  CTAB Abdomen: Soft, nontender, nondistended, bowel sounds present Musculoskeletal: No bilateral pedal edema noted Skin: Normal Psychiatry: Normal mood  Neurology: 4/5 strength in LLE and LUE, 3/5 in RLE and 4/5 in RUE    Data Reviewed: CBC: Recent Labs  Lab 11/09/21 0800 11/11/21 0239 11/12/21 0038  WBC 8.0 6.6 7.3  NEUTROABS 5.3 3.8 3.9  HGB 14.6 13.7 14.1  HCT 44.6 42.0 43.5  MCV 88.7 87.3 87.0  PLT 251  230 237   Basic Metabolic Panel: Recent Labs  Lab 11/09/21 0800 11/11/21 0239 11/12/21 0038  NA 138 135 136  K 4.3 4.1 4.4  CL 108 103 101  CO2 21* 22 24  GLUCOSE 161* 158* 177*  BUN 22 20 21   CREATININE 0.83 0.85 0.85  CALCIUM 8.5* 9.0 9.1   GFR: Estimated Creatinine Clearance: 63.9 mL/min (by C-G formula based on SCr of 0.85 mg/dL). Liver Function Tests: Recent Labs  Lab 11/09/21 0800  AST 21  ALT 24  ALKPHOS 82  BILITOT 0.4  PROT 6.6  ALBUMIN 3.2*   No results for input(s): LIPASE, AMYLASE in the last 168 hours. No results for input(s): AMMONIA in the last 168 hours. Coagulation Profile: Recent Labs  Lab 11/09/21 0800  INR 1.0   Cardiac Enzymes: No results for input(s): CKTOTAL, CKMB, CKMBINDEX, TROPONINI in the last 168 hours. BNP (last 3 results) No results for input(s): PROBNP in the last 8760 hours. HbA1C: Recent Labs    11/10/21 0429  HGBA1C 8.0*   CBG: Recent Labs  Lab 11/11/21 1155 11/11/21 1605 11/11/21 2113 11/12/21 0621 11/12/21 1148  GLUCAP 154* 183* 207* 138* 218*   Lipid Profile: Recent Labs    11/10/21 0429  CHOL 162  HDL 39*  LDLCALC 90  TRIG 166*  CHOLHDL 4.2   Thyroid Function Tests: No results for input(s): TSH, T4TOTAL, FREET4, T3FREE, THYROIDAB in the last 72 hours. Anemia Panel: No results for input(s): VITAMINB12, FOLATE, FERRITIN, TIBC, IRON, RETICCTPCT in the last 72 hours. Urine analysis:    Component Value Date/Time   COLORURINE YELLOW 11/09/2021 0801   APPEARANCEUR CLEAR 11/09/2021 0801   LABSPEC 1.025 11/09/2021 0801   PHURINE 5.5 11/09/2021 0801   GLUCOSEU >=500 (A) 11/09/2021 0801   HGBUR NEGATIVE 11/09/2021 0801   BILIRUBINUR NEGATIVE 11/09/2021 0801   KETONESUR NEGATIVE 11/09/2021 0801   PROTEINUR NEGATIVE 11/09/2021 0801   NITRITE NEGATIVE 11/09/2021 0801   LEUKOCYTESUR NEGATIVE 11/09/2021 0801   Sepsis Labs: @LABRCNTIP (procalcitonin:4,lacticidven:4)  ) Recent Results (from the past 240  hour(s))  Resp Panel by RT-PCR (Flu A&B, Covid) Nasopharyngeal Swab     Status: None   Collection Time: 11/09/21  8:00 AM   Specimen: Nasopharyngeal Swab; Nasopharyngeal(NP) swabs in vial transport medium  Result Value Ref Range Status   SARS Coronavirus 2 by RT PCR NEGATIVE NEGATIVE Final    Comment: (NOTE) SARS-CoV-2 target nucleic acids are NOT DETECTED.  The SARS-CoV-2 RNA is generally detectable in upper respiratory specimens during the acute phase of infection. The lowest concentration of SARS-CoV-2 viral copies this assay can detect is 138 copies/mL. A negative result does not preclude SARS-Cov-2 infection and should not be used as the sole basis for treatment or other patient management decisions. A negative result may occur with  improper specimen collection/handling, submission of specimen other than nasopharyngeal swab, presence of viral mutation(s) within the areas targeted by this assay, and inadequate number of viral copies(<138 copies/mL). A negative result must be combined with clinical observations, patient history, and epidemiological information.  The expected result is Negative.  Fact Sheet for Patients:  EntrepreneurPulse.com.au  Fact Sheet for Healthcare Providers:  IncredibleEmployment.be  This test is no t yet approved or cleared by the [place] FDA and  has been authorized for detection and/or diagnosis of SARS-CoV-2 by FDA under an Emergency Use Authorization (EUA). This EUA will remain  in effect (meaning this test can be used) for the duration of the COVID-19 declaration under Section 564(b)(1) of the Act, 21 U.S.C.section 360bbb-3(b)(1), unless the authorization is terminated  or revoked sooner.       Influenza A by PCR NEGATIVE NEGATIVE Final   Influenza B by PCR NEGATIVE NEGATIVE Final    Comment: (NOTE) The Xpert Xpress SARS-CoV-2/FLU/RSV plus assay is intended as an aid in the diagnosis of influenza from  Nasopharyngeal swab specimens and should not be used as a sole basis for treatment. Nasal washings and aspirates are unacceptable for Xpert Xpress SARS-CoV-2/FLU/RSV testing.  Fact Sheet for Patients: EntrepreneurPulse.com.au  Fact Sheet for Healthcare Providers: IncredibleEmployment.be  This test is not yet approved or cleared by the [place] FDA and has been authorized for detection and/or diagnosis of SARS-CoV-2 by FDA under an Emergency Use Authorization (EUA). This EUA will remain in effect (meaning this test can be used) for the duration of the COVID-19 declaration under Section 564(b)(1) of the Act, 21 U.S.C. section 360bbb-3(b)(1), unless the authorization is terminated or revoked.  Performed at [facility], [place] [address] [place], [place] [address]       Studies: No results found.  Scheduled Meds:  aspirin EC  81 mg Oral Daily   atorvastatin  40 mg Oral Daily   clopidogrel  75 mg Oral Daily   enoxaparin (LOVENOX) injection  40 mg Subcutaneous Daily   insulin aspart  0-15 Units Subcutaneous TID WC   insulin detemir  60 Units Subcutaneous QHS   levETIRAcetam  750 mg Oral BID   levothyroxine  100 mcg Oral QAC breakfast   multivitamin with minerals  1 tablet Oral Daily   timolol  1 drop Right Eye BID    Continuous Infusions:     LOS: 3 days     Alma Friendly, MD [facility]  If 7PM-7AM, please contact night-coverage [email or web address] 11/12/2021, 3:52 PM

## 2021-11-12 NOTE — PMR Pre-admission (Signed)
PMR Admission Coordinator Pre-Admission Assessment  Patient: Connie Campbell is an 71 y.o., female MRN: [id number] DOB: 08-28-1951 Height: 5' 3"  (160 cm) Weight: 83.3 kg  Insurance Information HMO: yes     PPO:      PCP:      IPA:      80/20:      OTHER:  PRIMARY: [employer]       Policy#: [id number]      Subscriber: PT CM Name: Fontaine No      Phone#: [phone] x [phone]     Fax#: [phone] Pre-Cert#: [id number]      Employer: [employer] Date: 10/08/2021- still active  Deductible: does not have one  OOP Max: $8,300 ($36.03 met)  CIR: $370/day co-pay with a max co-pay of $2,220/admission (6 days)  SNF: $0/day co-pay for days 1-20, $196/day co-pay for days 21-100, limited to 100 days/cal yr  Outpatient:  80% coverage; 20% co-insurance  Home Health:  100% coverage  DME: 80% coverage; 20% co-insurance  Providers: in network   SECONDARY: Medicaid [place] Access       Policy#: [id number]     Phone#:   Development worker, community:       Phone#:   The [employer] for patients in Inpatient Rehabilitation Facilities with attached Privacy Act [place] Records was provided and verbally reviewed with: Patient  Emergency Contact Information Contact Information     Name Relation Home Work Mobile   HEAFNER,LESLIE Spouse   [phone]       Current Medical History  Patient Admitting Diagnosis: CVA  History of Present Illness: Connie Campbell is a 71 y.o. female with medical history significant of glaucoma, CVA and HTN presenting inability to walk, difficulty getting out of bed and dizziness that started 11/07/21. Pt was unable to lift her R leg and was shuffling. Of note, her first stroke led to R-sided deficits and her second stroke led to speech and cognitive issues. Her speech is unchanged from baseline.  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 She presented to [facility] ED 11/09/21.  MRI showed Small acute L frontal lobe infarct in  the distribution of ACA; multiple chronic areas of ischemia in cerebrum and cerebellum. MRA showed   No large vessel occlusion. Carotid Doppler revealed  Bilateral ICA with 1-39% stenosis. 2D Echo: LVEF 60-65%, Trivial MVR and AVR. Aortic valve sclerosis without stenosis. LE Doppler no evidence of DVT. Plan for loop recorder prior to discharge. PT and OT saw Pt. And recommended CIR to assist return to PLOF.  Complete NIHSS TOTAL: 2  Patient's medical record from [facility] has been reviewed by the rehabilitation admission coordinator and physician.  Past Medical History  Past Medical History:  Diagnosis Date   Celiac disease    Class 1 obesity due to excess calories with body mass index (BMI) of 31.0 to 31.9 in adult    Dyslipidemia    Glaucoma    Hypertension    Hypothyroidism (acquired)    Stroke [facility])    Trigeminal neuralgia     Has the patient had major surgery during 100 days prior to admission? Yes  Family History   family history is not on file.  Current Medications  Current Facility-Administered Medications:    acetaminophen (TYLENOL) tablet 650 mg, 650 mg, Oral, Q4H PRN **OR** acetaminophen (TYLENOL) 160 MG/5ML solution 650 mg, 650 mg, Per Tube, Q4H PRN **OR** acetaminophen (TYLENOL) suppository 650 mg, 650 mg, Rectal, Q4H PRN, Karmen Bongo, MD  aspirin EC tablet 81 mg, 81 mg, Oral, Daily, Shafer, Devon, NP, 81 mg at 11/11/21 0948   atorvastatin (LIPITOR) tablet 40 mg, 40 mg, Oral, Daily, Karmen Bongo, MD, 40 mg at 11/11/21 0948   baclofen (LIORESAL) tablet 10 mg, 10 mg, Oral, Daily PRN, Karmen Bongo, MD, 10 mg at 11/11/21 2346   clopidogrel (PLAVIX) tablet 75 mg, 75 mg, Oral, Daily, Shafer, Devon, NP, 75 mg at 11/11/21 0948   enoxaparin (LOVENOX) injection 40 mg, 40 mg, Subcutaneous, Daily, Karmen Bongo, MD, 40 mg at 11/11/21 0948   insulin aspart (novoLOG) injection 0-15 Units, 0-15 Units, Subcutaneous, TID WC, Karmen Bongo, MD, 2 Units at  11/12/21 0848   insulin detemir (LEVEMIR) injection 60 Units, 60 Units, Subcutaneous, QHS, Alma Friendly, MD, 60 Units at 11/11/21 2202   levETIRAcetam (KEPPRA) tablet 750 mg, 750 mg, Oral, BID, Karmen Bongo, MD, 750 mg at 11/11/21 2025   levothyroxine (SYNTHROID) tablet 100 mcg, 100 mcg, Oral, QAC breakfast, Karmen Bongo, MD, 100 mcg at 11/12/21 0545   multivitamin with minerals tablet 1 tablet, 1 tablet, Oral, Daily, Alma Friendly, MD, 1 tablet at 11/11/21 4008   oxyCODONE-acetaminophen (PERCOCET/ROXICET) 5-325 MG per tablet 1 tablet, 1 tablet, Oral, QID PRN, 1 tablet at 11/12/21 0548 **AND** [DISCONTINUED] oxyCODONE (Oxy IR/ROXICODONE) immediate release tablet 5 mg, 5 mg, Oral, QID PRN, Joselyn Glassman A, RPH, 5 mg at 11/09/21 1815   polyvinyl alcohol (LIQUIFILM TEARS) 1.4 % ophthalmic solution 1 drop, 1 drop, Both Eyes, PRN, Horris Latino, Adline Peals, MD   senna-docusate (Senokot-S) tablet 1 tablet, 1 tablet, Oral, QHS PRN, Karmen Bongo, MD, 1 tablet at 11/10/21 0549   timolol (TIMOPTIC) 0.5 % ophthalmic solution 1 drop, 1 drop, Right Eye, BID, Karmen Bongo, MD, 1 drop at 11/11/21 2026  Patients Current Diet:  Diet Order             Diet heart healthy/carb modified Room service appropriate? Yes; Fluid consistency: Thin  Diet effective ____                   Precautions / Restrictions Precautions Precautions: Fall Restrictions Weight Bearing Restrictions: No   Has the patient had 2 or more falls or a fall with injury in the past year? Yes  Prior Activity Level Limited Community (1-2x/wk): Pt. active in the community PTA  Prior Functional Level Self Care: Did the patient need help bathing, dressing, using the toilet or eating? Independent  Indoor Mobility: Did the patient need assistance with walking from room to room (with or without device)? Independent  Stairs: Did the patient need assistance with internal or external stairs (with or without device)?  Independent  Functional Cognition: Did the patient need help planning regular tasks such as shopping or remembering to take medications? Independent  Patient Information Are you of Hispanic, Latino/a,or Spanish origin?: A. No, not of Hispanic, Latino/a, or Spanish origin What is your race?: A. White Do you need or want an interpreter to communicate with a doctor or health care staff?: 0. No  Patient's Response To:  Health Literacy and Transportation Is the patient able to respond to health literacy and transportation needs?: Yes Health Literacy - How often do you need to have someone help you when you read instructions, pamphlets, or other written material from your doctor or pharmacy?: Never In the past 12 months, has lack of transportation kept you from medical appointments or from getting medications?: Yes In the past 12 months, has lack of transportation kept you  from meetings, work, or from getting things needed for daily living?: Yes  [place] / [place] Devices/Equipment: Environmental consultant (specify type), Wheelchair, Radio producer (specify quad or straight) Home Equipment: Conservation officer, nature (2 wheels), [employer] - single point, Grab bars - tub/shower  Prior Device Use: Indicate devices/aids used by the patient prior to current illness, exacerbation or injury? None of the above  Current Functional Level Cognition  Arousal/Alertness: Awake/alert Overall Cognitive Status: Impaired/Different from baseline Orientation Level: Oriented X4 Safety/Judgement: Decreased awareness of safety, Decreased awareness of deficits Attention: Focused Focused Attention: Appears intact Memory: Impaired Memory Impairment: Storage deficit, Decreased recall of new information, Decreased short term memory Decreased Short Term Memory: Verbal basic Awareness: Appears intact Problem Solving: Appears intact Safety/Judgment: Appears intact    Extremity Assessment (includes Sensation/Coordination)   Upper Extremity Assessment: Generalized weakness, RUE deficits/detail RUE Deficits / Details: weakness and mixed tone; Mild weakness in grip R>L RUE Sensation: decreased light touch RUE Coordination: decreased fine motor, decreased gross motor  Lower Extremity Assessment: RLE deficits/detail RLE Deficits / Details: Decreased strength and mild coordination deficits noted during functional mobility. Grossly    ADLs  Overall ADL's : Needs assistance/impaired Grooming: Wash/dry hands, Oral care, Standing, Min guard Upper Body Bathing: Minimal assistance, Sitting Lower Body Bathing: Moderate assistance, Sit to/from stand Upper Body Dressing : Minimal assistance, Sitting Lower Body Dressing: Min guard, Sit to/from stand Lower Body Dressing Details (indicate cue type and reason): Can do socks but struggles (more with left than right) Toilet Transfer: Min guard, Ambulation, Rolling walker (2 wheels), Comfort height toilet, Grab bars Toileting- Clothing Manipulation and Hygiene: Min guard, Sit to/from stand    Mobility  Overal bed mobility: Needs Assistance Bed Mobility: Supine to Sit Supine to sit: Min guard General bed mobility comments: Increased time and use of rails required. No assist to transition fully to EOB.    Transfers  Overall transfer level: Needs assistance Equipment used: Rolling walker (2 wheels) Transfers: Sit to/from Stand Sit to Stand: Min guard Bed to/from chair/wheelchair/BSC transfer type:: Step pivot Step pivot transfers: Min assist General transfer comment: min assist with sit to stand transfer with VC's for hand placement. Needed min assist to maintain balance upon initial sit to stand transfer. Patient had some impulsivety with transfers and ambulation but was corrected with verbal cues to wait before next activity.    Ambulation / Gait / Stairs / Wheelchair Mobility  Ambulation/Gait Ambulation/Gait assistance: Herbalist (Feet): 75 Feet Assistive  device: Rolling walker (2 wheels) Gait Pattern/deviations: Step-to pattern, Decreased step length - right, Decreased step length - left, Decreased stride length, Decreased dorsiflexion - right, Shuffle, Narrow base of support General Gait Details: Patient required VC's to not drag right foot as much on the ground and was able to correct. She had trouble with a step to pattern and was very slow. Needed intermittent min assist with balance during gait. Gait velocity: decreased Gait velocity interpretation: <1.31 ft/sec, indicative of household ambulator    Posture / Balance Balance Overall balance assessment: Needs assistance Sitting-balance support: No upper extremity supported, Feet supported Sitting balance-Leahy Scale: Good Standing balance support: No upper extremity supported, During functional activity Standing balance-Leahy Scale: Fair Standing balance comment: BUE support using 2 wheel rolling walker    Special needs/care consideration Skin intact   Previous Home Environment (from acute therapy documentation) Living Arrangements: Spouse/significant other, Children  Lives With: Spouse Available Help at Discharge: Family, Available PRN/intermittently Type of Home: House Home Layout: One level,  Other (Comment) (sunken living room with 4 stairs and HR on both sides) Home Access: Ramped entrance Bathroom Shower/Tub: Chiropodist: Standard Bathroom Accessibility: Yes How Accessible: Accessible via walker Home Care Services: No Additional Comments: spouse is disabled and patient is primary CG.  SIL and diaghter stay with them, work during the day at home, but can only provide home management, meals and community mobility.  Discharge Living Setting Plans for Discharge Living Setting: Patient's home Type of Home at Discharge: House Discharge Home Layout: One level Discharge Home Access: [place] entrance Discharge Bathroom Shower/Tub: [place] unit Discharge  Bathroom Toilet: Standard Discharge Bathroom Accessibility: Yes How Accessible: Accessible via walker Does the patient have any problems obtaining your medications?: No  Social/Family/Support Systems Patient Roles: Spouse Contact Information: [phone] Anticipated Caregiver: Juliannah Ohmann Anticipated Caregiver's Contact Information: [phone] Ability/Limitations of Caregiver: Spouse disabled, can provide supervision. Daughter and SIL also able to assist Caregiver Availability: 24/7 Discharge Plan Discussed with Primary Caregiver: Yes Is Caregiver In Agreement with Plan?: Yes Does Caregiver/Family have Issues with Lodging/Transportation while Pt is in Rehab?: No  Goals Patient/Family Goal for Rehab: PT/OT/SLP Supervision Expected length of stay: 7-10 days Pt/Family Agrees to Admission and willing to participate: Yes Program Orientation Provided & Reviewed with Pt/Caregiver Including Roles  & Responsibilities: Yes  Decrease burden of Care through IP rehab admission: Specialzed equipment needs, Decrease number of caregivers, Bowel and bladder program, and Patient/family education  Possible need for SNF placement upon discharge: not anticipated   Patient Condition: I have reviewed medical records from [facility], spoken with CM, and patient, spouse, and daughter. I met with patient at the bedside for inpatient rehabilitation assessment.  Patient will benefit from ongoing PT, OT, and SLP, can actively participate in 3 hours of therapy a day 5 days of the week, and can make measurable gains during the admission.  Patient will also benefit from the coordinated team approach during an Inpatient Acute Rehabilitation admission.  The patient will receive intensive therapy as well as Rehabilitation physician, nursing, social worker, and care management interventions.  Due to safety, skin/wound care, disease management, medication administration, pain management, and patient  education the patient requires 24 hour a day rehabilitation nursing.  The patient is currently min A-min g with mobility and basic ADLs.  Discharge setting and therapy post discharge at home with home health is anticipated.  Patient has agreed to participate in the Acute Inpatient Rehabilitation Program and will admit today.  Preadmission Screen Completed By:  Genella Mech, 11/12/2021 10:05 AM ______________________________________________________________________   Discussed status with Dr. Ranell Patrick  on 11/15/21 at 66 and received approval for admission today.  Admission Coordinator:  Genella Mech, CCC-SLP, time 1030/Date 11/15/21   Assessment/Plan: Diagnosis: CVA Does the need for close, 24 hr/day Medical supervision in concert with the patient's rehab needs make it unreasonable for this patient to be served in a less intensive setting? Yes Co-Morbidities requiring supervision/potential complications: overweight, hypertension, dyslipidemia, hypothyroidism, class 1 obesity Due to bladder management, bowel management, safety, skin/wound care, disease management, medication administration, pain management, and patient education, does the patient require 24 hr/day rehab nursing? Yes Does the patient require coordinated care of a physician, rehab nurse, PT, OT, and SLP to address physical and functional deficits in the context of the above medical diagnosis(es)? Yes Addressing deficits in the following areas: balance, endurance, locomotion, strength, transferring, bowel/bladder control, bathing, dressing, feeding, grooming, toileting, cognition, and psychosocial support Can the patient actively participate in an  intensive therapy program of at least 3 hrs of therapy 5 days a week? Yes The potential for patient to make measurable gains while on inpatient rehab is excellent Anticipated functional outcomes upon discharge from inpatient rehab: supervision PT, supervision OT, supervision SLP Estimated rehab  length of stay to reach the above functional goals is: 5-7 days Anticipated discharge destination: Home 10. Overall Rehab/Functional Prognosis: excellent   MD Signature: Leeroy Cha, MD

## 2021-11-13 DIAGNOSIS — I69398 Other sequelae of cerebral infarction: Secondary | ICD-10-CM | POA: Diagnosis not present

## 2021-11-13 DIAGNOSIS — I10 Essential (primary) hypertension: Secondary | ICD-10-CM | POA: Diagnosis not present

## 2021-11-13 DIAGNOSIS — E785 Hyperlipidemia, unspecified: Secondary | ICD-10-CM | POA: Diagnosis not present

## 2021-11-13 DIAGNOSIS — G40909 Epilepsy, unspecified, not intractable, without status epilepticus: Secondary | ICD-10-CM | POA: Diagnosis not present

## 2021-11-13 LAB — GLUCOSE, CAPILLARY
Glucose-Capillary: 175 mg/dL — ABNORMAL HIGH (ref 70–99)
Glucose-Capillary: 194 mg/dL — ABNORMAL HIGH (ref 70–99)
Glucose-Capillary: 185 mg/dL — ABNORMAL HIGH (ref 70–99)
Glucose-Capillary: 254 mg/dL — ABNORMAL HIGH (ref 70–99)

## 2021-11-13 NOTE — Progress Notes (Signed)
PROGRESS NOTE  Connie Campbell VEH:[id number] DOB: 1951-06-11 DOA: 11/09/2021 PCP: Lilian Coma., MD  HPI/Recap of past 24 hours: Connie Campbell is a 71 y.o. female with medical history significant of glaucoma, CVA and HTN presenting inability to walk, difficulty getting out of bed and dizziness that started 11/07/21. Pt was unable to lift her R leg and was shuffling. Of note, her first stroke led to R-sided deficits and her second stroke led to speech and cognitive issues. Her speech is unchanged from baseline.  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 In the ED, MRI showed acute CVA. Neurology consulted.  Patient admitted for further management.     Today, patient denies any new complaints.     Assessment/Plan: Principal Problem:   Acute CVA (cerebrovascular accident) ([place]) Active Problems:   Hypertension   Dyslipidemia   Hypothyroidism (acquired)   Class 1 obesity due to excess calories with body mass index (BMI) of 31.0 to 31.9 in adult   Chronic pain disorder   Seizure disorder as sequela of cerebrovascular accident ([place])   Small acute left frontal lobe infarct  Embolic versus cardiogenic source MRI showed above, MRA no large vessel occlusion Carotid Doppler with bilateral ICA with 1 to 39% stenosis Echo with EF of 60 to 65%, aortic valve sclerosis without stenosis Lower extremity Doppler negative for DVT LDL 90, A1c 8 Neurology consulted, plan for aspirin and Plavix x3 weeks, then Plavix alone.  Recommend loop recorder upon discharge to rule out underlying paroxysmal A. Fib, cardiology/EP consulted PT/OT/ST-recommend CIR   HTN BP stable Restart home ARB (substituted)   HLD LDL 90 Lipitor 40 mg daily (pt reports it worsens her dystonia the last time she took it, but will attempt taking again)   DM type 2, uncontrolled A1c 8 Hold home PO medications (Glucophage, Jardiance) SSI, levemir, Accu-Cheks, hypoglycemic protocol   Seizure disorder as  sequela of cerebrovascular accident  Continue Keppra   Chronic pain disorder Continue Percocet, Baclofen   Obesity Lifestyle modification advised   Hypothyroidism (acquired) Continue Synthroid        Malnutrition Type:  Nutrition Problem: Increased nutrient needs Etiology: acute illness   Malnutrition Characteristics:  Signs/Symptoms: estimated needs   Nutrition Interventions:  Interventions: MVI    Estimated body mass index is 32.53 kg/m as calculated from the following:   Height as of this encounter: 5' 3"  (1.6 m).   Weight as of this encounter: 83.3 kg.     Code Status: Full  Family Communication: None at bedside  Disposition Plan: Status is: Inpatient Remains inpatient appropriate because: Level of care    Consultants: Neurology  Procedures: None  Antimicrobials: None  DVT prophylaxis: Lovenox   Objective: Vitals:   11/12/21 2013 11/12/21 2356 11/13/21 0331 11/13/21 1153  BP: 116/69 (!) 104/52 105/60 125/74  Pulse: 75 72 71 76  Resp: 18 18 16 20   Temp: 98.2 F (36.8 C) 98.2 F (36.8 C) 97.8 F (36.6 C) (!) 97.3 F (36.3 C)  TempSrc: Oral Oral Oral Oral  SpO2: 94% 95% 96% 98%  Weight:      Height:        Intake/Output Summary (Last 24 hours) at 11/13/2021 1439 Last data filed at 11/12/2021 2357 Gross per 24 hour  Intake --  Output 0 ml  Net 0 ml   Filed Weights   11/09/21 0752 11/09/21 1502  Weight: 85.1 kg 83.3 kg    Exam: General: NAD, poor dentition  Cardiovascular: S1, S2 present Respiratory: CTAB  Abdomen: Soft, nontender, nondistended, bowel sounds present Musculoskeletal: No bilateral pedal edema noted Skin: Normal Psychiatry: Normal mood  Neurology: 4/5 strength in LLE and LUE, 3/5 in RLE and 4/5 in RUE    Data Reviewed: CBC: Recent Labs  Lab 11/09/21 0800 11/11/21 0239 11/12/21 0038  WBC 8.0 6.6 7.3  NEUTROABS 5.3 3.8 3.9  HGB 14.6 13.7 14.1  HCT 44.6 42.0 43.5  MCV 88.7 87.3 87.0  PLT 251 230 937    Basic Metabolic Panel: Recent Labs  Lab 11/09/21 0800 11/11/21 0239 11/12/21 0038  NA 138 135 136  K 4.3 4.1 4.4  CL 108 103 101  CO2 21* 22 24  GLUCOSE 161* 158* 177*  BUN 22 20 21   CREATININE 0.83 0.85 0.85  CALCIUM 8.5* 9.0 9.1   GFR: Estimated Creatinine Clearance: 63.9 mL/min (by C-G formula based on SCr of 0.85 mg/dL). Liver Function Tests: Recent Labs  Lab 11/09/21 0800  AST 21  ALT 24  ALKPHOS 82  BILITOT 0.4  PROT 6.6  ALBUMIN 3.2*   No results for input(s): LIPASE, AMYLASE in the last 168 hours. No results for input(s): AMMONIA in the last 168 hours. Coagulation Profile: Recent Labs  Lab 11/09/21 0800  INR 1.0   Cardiac Enzymes: No results for input(s): CKTOTAL, CKMB, CKMBINDEX, TROPONINI in the last 168 hours. BNP (last 3 results) No results for input(s): PROBNP in the last 8760 hours. HbA1C: No results for input(s): HGBA1C in the last 72 hours.  CBG: Recent Labs  Lab 11/12/21 1148 11/12/21 1629 11/12/21 2155 11/13/21 0557 11/13/21 1221  GLUCAP 218* 123* 193* 175* 194*   Lipid Profile: No results for input(s): CHOL, HDL, LDLCALC, TRIG, CHOLHDL, LDLDIRECT in the last 72 hours.  Thyroid Function Tests: No results for input(s): TSH, T4TOTAL, FREET4, T3FREE, THYROIDAB in the last 72 hours. Anemia Panel: No results for input(s): VITAMINB12, FOLATE, FERRITIN, TIBC, IRON, RETICCTPCT in the last 72 hours. Urine analysis:    Component Value Date/Time   COLORURINE YELLOW 11/09/2021 0801   APPEARANCEUR CLEAR 11/09/2021 0801   LABSPEC 1.025 11/09/2021 0801   PHURINE 5.5 11/09/2021 0801   GLUCOSEU >=500 (A) 11/09/2021 0801   HGBUR NEGATIVE 11/09/2021 0801   BILIRUBINUR NEGATIVE 11/09/2021 0801   KETONESUR NEGATIVE 11/09/2021 0801   PROTEINUR NEGATIVE 11/09/2021 0801   NITRITE NEGATIVE 11/09/2021 0801   LEUKOCYTESUR NEGATIVE 11/09/2021 0801   Sepsis Labs: @LABRCNTIP (procalcitonin:4,lacticidven:4)  ) Recent Results (from the past 240  hour(s))  Resp Panel by RT-PCR (Flu A&B, Covid) Nasopharyngeal Swab     Status: None   Collection Time: 11/09/21  8:00 AM   Specimen: Nasopharyngeal Swab; Nasopharyngeal(NP) swabs in vial transport medium  Result Value Ref Range Status   SARS Coronavirus 2 by RT PCR NEGATIVE NEGATIVE Final    Comment: (NOTE) SARS-CoV-2 target nucleic acids are NOT DETECTED.  The SARS-CoV-2 RNA is generally detectable in upper respiratory specimens during the acute phase of infection. The lowest concentration of SARS-CoV-2 viral copies this assay can detect is 138 copies/mL. A negative result does not preclude SARS-Cov-2 infection and should not be used as the sole basis for treatment or other patient management decisions. A negative result may occur with  improper specimen collection/handling, submission of specimen other than nasopharyngeal swab, presence of viral mutation(s) within the areas targeted by this assay, and inadequate number of viral copies(<138 copies/mL). A negative result must be combined with clinical observations, patient history, and epidemiological information. The expected result is Negative.  Fact Sheet for Patients:  EntrepreneurPulse.com.au  Fact Sheet for Healthcare Providers:  IncredibleEmployment.be  This test is no t yet approved or cleared by the [place] FDA and  has been authorized for detection and/or diagnosis of SARS-CoV-2 by FDA under an Emergency Use Authorization (EUA). This EUA will remain  in effect (meaning this test can be used) for the duration of the COVID-19 declaration under Section 564(b)(1) of the Act, 21 U.S.C.section 360bbb-3(b)(1), unless the authorization is terminated  or revoked sooner.       Influenza A by PCR NEGATIVE NEGATIVE Final   Influenza B by PCR NEGATIVE NEGATIVE Final    Comment: (NOTE) The Xpert Xpress SARS-CoV-2/FLU/RSV plus assay is intended as an aid in the diagnosis of influenza from  Nasopharyngeal swab specimens and should not be used as a sole basis for treatment. Nasal washings and aspirates are unacceptable for Xpert Xpress SARS-CoV-2/FLU/RSV testing.  Fact Sheet for Patients: EntrepreneurPulse.com.au  Fact Sheet for Healthcare Providers: IncredibleEmployment.be  This test is not yet approved or cleared by the [place] FDA and has been authorized for detection and/or diagnosis of SARS-CoV-2 by FDA under an Emergency Use Authorization (EUA). This EUA will remain in effect (meaning this test can be used) for the duration of the COVID-19 declaration under Section 564(b)(1) of the Act, 21 U.S.C. section 360bbb-3(b)(1), unless the authorization is terminated or revoked.  Performed at [facility], [place] [address] [place], [place] [address]       Studies: No results found.  Scheduled Meds:  aspirin EC  81 mg Oral Daily   atorvastatin  40 mg Oral Daily   clopidogrel  75 mg Oral Daily   enoxaparin (LOVENOX) injection  40 mg Subcutaneous Daily   insulin aspart  0-15 Units Subcutaneous TID WC   insulin detemir  60 Units Subcutaneous QHS   irbesartan  75 mg Oral Daily   levETIRAcetam  750 mg Oral BID   levothyroxine  100 mcg Oral QAC breakfast   multivitamin with minerals  1 tablet Oral Daily   timolol  1 drop Right Eye BID    Continuous Infusions:     LOS: 4 days     Alma Friendly, MD [facility]  If 7PM-7AM, please contact night-coverage [email or web address] 11/13/2021, 2:39 PM

## 2021-11-13 NOTE — Progress Notes (Signed)
Occupational Therapy Treatment Patient Details Name: Connie Campbell MRN: [id number] DOB: July 23, 1951 Today's Date: 11/13/2021   History of present illness Pt is a 71 y/o. female who presents with dizziness and difficulty moving her right arm and right leg more than baseline. MRI revealed small acute posterior left frontal lobe infarcts. PMH significant for CVA with residual right-sided deficits, trigeminal neuralgia, hypertension, diabetes.   OT comments  Patient eager to participate in OT session, highly motivated for increased independence.  Utilized RW with min guard for in room mobility and transfers, standing at sink for grooming with min guard.  Cueing for safety and problem solving using RW. Fatigues easily. Highly recommend AIR at dc, to optimize strength, tolerance, safety for independence and ability to care for her spouse. Will follow acutely.    Recommendations for follow up therapy are one component of a multi-disciplinary discharge planning process, led by the attending physician.  Recommendations may be updated based on patient status, additional functional criteria and insurance authorization.    Follow Up Recommendations  Acute inpatient rehab (3hours/day)    Assistance Recommended at Discharge None (if goes to AIR)  Patient can return home with the following  A little help with walking and/or transfers;A little help with bathing/dressing/bathroom;Help with stairs or ramp for entrance;Assist for transportation;Assistance with cooking/housework   Equipment Recommendations  BSC/3in1;Tub/shower bench    Recommendations for Other Services      Precautions / Restrictions Precautions Precautions: Fall Restrictions Weight Bearing Restrictions: No       Mobility Bed Mobility Overal bed mobility: Needs Assistance Bed Mobility: Supine to Sit     Supine to sit: Supervision     General bed mobility comments: increased time, + rail and HOB elevated    Transfers                          Balance Overall balance assessment: Needs assistance Sitting-balance support: No upper extremity supported, Feet supported Sitting balance-Leahy Scale: Good     Standing balance support: During functional activity, Reliant on assistive device for balance, Bilateral upper extremity supported Standing balance-Leahy Scale: Fair Standing balance comment: relies on UE support or foward lean onto sink to support self                           ADL either performed or assessed with clinical judgement   ADL Overall ADL's : Needs assistance/impaired     Grooming: Wash/dry hands;Wash/dry face;Oral care;Applying deodorant;Min guard;Standing               Lower Body Dressing: Min guard;Sit to/from stand   Toilet Transfer: Min guard;Ambulation;Rolling walker (2 wheels) Toilet Transfer Details (indicate cue type and reason): simulated in room         Functional mobility during ADLs: Min guard;Rolling walker (2 wheels) General ADL Comments: requires cueing for hand placement and safety    Extremity/Trunk Assessment              Vision       Perception     Praxis      Cognition Arousal/Alertness: Awake/alert Behavior During Therapy: WFL for tasks assessed/performed Overall Cognitive Status: Impaired/Different from baseline Area of Impairment: Safety/judgement, Problem solving                         Safety/Judgement: Decreased awareness of safety   Problem Solving: Requires verbal cues  Exercises      Shoulder Instructions       General Comments VSS- HR in 80s    Pertinent Vitals/ Pain       Pain Assessment Pain Assessment: No/denies pain  Home Living                                          Prior Functioning/Environment              Frequency  Min 2X/week        Progress Toward Goals  OT Goals(current goals can now be found in the care plan section)  Progress towards  OT goals: Progressing toward goals  Acute Rehab OT Goals Patient Stated Goal: to get into rehab before going home OT Goal Formulation: With patient Time For Goal Achievement: 11/23/21 ADL Goals Pt Will Perform Grooming: with modified independence;standing Pt Will Perform Lower Body Bathing: with modified independence;sit to/from stand Pt Will Perform Lower Body Dressing: with modified independence;sit to/from stand Pt Will Transfer to Toilet: with modified independence;ambulating Pt Will Perform Toileting - Clothing Manipulation and hygiene: with modified independence;sit to/from stand  Plan Discharge plan remains appropriate;Frequency remains appropriate    Co-evaluation                 AM-PAC OT "6 Clicks" Daily Activity     Outcome Measure   Help from another person eating meals?: None Help from another person taking care of personal grooming?: A Little Help from another person toileting, which includes using toliet, bedpan, or urinal?: A Little Help from another person bathing (including washing, rinsing, drying)?: A Little Help from another person to put on and taking off regular upper body clothing?: A Little Help from another person to put on and taking off regular lower body clothing?: A Little 6 Click Score: 19    End of Session Equipment Utilized During Treatment: Rolling walker (2 wheels)  OT Visit Diagnosis: Unsteadiness on feet (R26.81);Muscle weakness (generalized) (M62.81) Hemiplegia - Right/Left: Right Hemiplegia - dominant/non-dominant: Dominant Hemiplegia - caused by: Cerebral infarction   Activity Tolerance Patient tolerated treatment well   Patient Left in chair;with call bell/phone within reach;with bed alarm set   Nurse Communication Mobility status        Time: 1660-6301 OT Time Calculation (min): 21 min  Charges: OT General Charges $OT Visit: 1 Visit OT Treatments $Self Care/Home Management : 8-22 mins  Jolaine Artist, [place] Pager [phone] Office [phone]   [name] 11/13/2021, 9:23 AM

## 2021-11-13 NOTE — Care Management Important Message (Signed)
Important Message  Patient Details  Name: Connie Campbell MRN: [id number] Date of Birth: 1950/11/08   Medicare Important Message Given:  Yes     Adal Sereno 11/13/2021, 2:09 PM

## 2021-11-13 NOTE — Progress Notes (Signed)
Inpatient Rehab Admissions Coordinator:   I do not yet have insurance auth or a bed for this Pt. On CIR. Pending insurance. I will follow for potential admission pending insurance auth.   Clemens Catholic, [place], [place] Admissions Coordinator  [phone] ([place]) [phone] (office)

## 2021-11-13 NOTE — Progress Notes (Signed)
Physical Therapy Treatment Patient Details Name: Connie Campbell MRN: [id number] DOB: July 29, 1951 Today's Date: 11/13/2021   History of Present Illness Pt is a 71 y/o. female who presents with dizziness and difficulty moving her right arm and right leg more than baseline. MRI revealed small acute posterior left frontal lobe infarcts. PMH significant for CVA with residual right-sided deficits, trigeminal neuralgia, hypertension, diabetes.    PT Comments    Patient progressing well towards PT goals. Reports improved strength in RLE but still not at baseline. Session focused on gait training without DME and balance activities. Pt requires Min-Mod A for gait training esp with balance challenges as pt with LOB during head turns, walking backwards and stepping over objects putting pt at increased risk for falls. Also with poor vision impacting depth perception. Took 50.09 secs to perform 5xSTS (normative for her age is 11.4 sec) indicating decreased functional strength, impaired balance and fall risk. Pt highly motivated to maximize independence and would be a great CIR candidate. Will follow.   Recommendations for follow up therapy are one component of a multi-disciplinary discharge planning process, led by the attending physician.  Recommendations may be updated based on patient status, additional functional criteria and insurance authorization.  Follow Up Recommendations  Acute inpatient rehab (3hours/day)     Assistance Recommended at Discharge PRN  Patient can return home with the following Assistance with cooking/housework;Assist for transportation;Help with stairs or ramp for entrance;A little help with bathing/dressing/bathroom;A little help with walking and/or transfers   Equipment Recommendations  None recommended by PT    Recommendations for Other Services       Precautions / Restrictions Precautions Precautions: Fall Restrictions Weight Bearing Restrictions: No     Mobility  Bed  Mobility               General bed mobility comments: Up in chair upon PT arrival.    Transfers Overall transfer level: Needs assistance Equipment used: Rolling walker (2 wheels), None Transfers: Sit to/from Stand Sit to Stand: Min guard, Min assist           General transfer comment: Min guard-Min A to stand from chair with cues for equal WB through BLEs as pt favoring LLE during transitions. Stood from chair x6. Min A for balance initially without UE support/device.    Ambulation/Gait Ambulation/Gait assistance: Min assist, Mod assist Gait Distance (Feet): 80 Feet Assistive device: None (rail) Gait Pattern/deviations: Decreased step length - right, Decreased step length - left, Decreased stride length, Decreased dorsiflexion - right, Shuffle, Step-through pattern, Wide base of support Gait velocity: decreased     General Gait Details: Slow, guarded and unsteady gait with decreased arm swing bilaterally, reaching for rail for support; wide BoS and Min-mod A esp with head turns or balance challenges. Impaired vision impacting depth perception.   Stairs             Wheelchair Mobility    Modified Rankin (Stroke Patients Only)       Balance Overall balance assessment: Needs assistance Sitting-balance support: No upper extremity supported, Feet supported Sitting balance-Leahy Scale: Good     Standing balance support: During functional activity Standing balance-Leahy Scale: Fair Standing balance comment: Able to stand statically for short period without UE support.             High level balance activites: Backward walking, Direction changes, Head turns, Turns High Level Balance Comments: Requires Min-Mod A for balance during above challenges, dizziness with walking backwards.  Cognition Arousal/Alertness: Awake/alert Behavior During Therapy: WFL for tasks assessed/performed Overall Cognitive Status: Impaired/Different from  baseline Area of Impairment: Safety/judgement, Problem solving                         Safety/Judgement: Decreased awareness of safety   Problem Solving: Requires verbal cues          Exercises      General Comments General comments (skin integrity, edema, etc.): Spouse present during session. Took 50.09 secs to perform 5xSTS (normative for her age is 11.4 sec) indicating decreased functional strength, impaired balance and fall risk.      Pertinent Vitals/Pain Pain Assessment Pain Assessment: No/denies pain    Home Living                          Prior Function            PT Goals (current goals can now be found in the care plan section) Progress towards PT goals: Progressing toward goals    Frequency    Min 4X/week      PT Plan Current plan remains appropriate    Co-evaluation              AM-PAC PT "6 Clicks" Mobility   Outcome Measure  Help needed turning from your back to your side while in a flat bed without using bedrails?: A Little Help needed moving from lying on your back to sitting on the side of a flat bed without using bedrails?: A Little Help needed moving to and from a bed to a chair (including a wheelchair)?: A Little Help needed standing up from a chair using your arms (e.g., wheelchair or bedside chair)?: A Little Help needed to walk in hospital room?: A Lot Help needed climbing 3-5 steps with a railing? : A Lot 6 Click Score: 16    End of Session Equipment Utilized During Treatment: Gait belt Activity Tolerance: Patient tolerated treatment well Patient left: in chair;with call bell/phone within reach;with chair alarm set;with family/visitor present Nurse Communication: Mobility status PT Visit Diagnosis: Unsteadiness on feet (R26.81);Other abnormalities of gait and mobility (R26.89);Muscle weakness (generalized) (M62.81);Dizziness and giddiness (R42)     Time: 8850-2774 PT Time Calculation (min) (ACUTE ONLY):  18 min  Charges:  $Neuromuscular Re-education: 8-22 mins                     Marisa Severin, PT, DPT Acute Rehabilitation Services Pager [phone] Office [place] 11/13/2021, 12:06 PM

## 2021-11-14 DIAGNOSIS — I69398 Other sequelae of cerebral infarction: Secondary | ICD-10-CM | POA: Diagnosis not present

## 2021-11-14 DIAGNOSIS — I10 Essential (primary) hypertension: Secondary | ICD-10-CM | POA: Diagnosis not present

## 2021-11-14 DIAGNOSIS — E785 Hyperlipidemia, unspecified: Secondary | ICD-10-CM | POA: Diagnosis not present

## 2021-11-14 DIAGNOSIS — G40909 Epilepsy, unspecified, not intractable, without status epilepticus: Secondary | ICD-10-CM | POA: Diagnosis not present

## 2021-11-14 LAB — GLUCOSE, CAPILLARY
Glucose-Capillary: 157 mg/dL — ABNORMAL HIGH (ref 70–99)
Glucose-Capillary: 144 mg/dL — ABNORMAL HIGH (ref 70–99)
Glucose-Capillary: 213 mg/dL — ABNORMAL HIGH (ref 70–99)
Glucose-Capillary: 150 mg/dL — ABNORMAL HIGH (ref 70–99)
Glucose-Capillary: 214 mg/dL — ABNORMAL HIGH (ref 70–99)

## 2021-11-14 NOTE — Progress Notes (Signed)
°   11/14/21 1420  Clinical Encounter Type  Visited With Patient  Visit Type Initial;Spiritual support  Referral From Nurse  Consult/Referral To None   Chaplain responded to request for advanced directive education. Patient already had a copy of the directive with all the explanations. She  stated that her daughter was filling out the form for her. And would bring it back when completed. Chaplain explained that it was important for the daughter to review the forms with her mother so that she is sure of choices. I also explained to the patient, when they were ready to have the paperwork notarized to let the nurse know and she would notify us.We will then make the arrangements for the notary and witnesses.  Patient agreed to review the completed documents and contact the nurse. Chaplain provided   [place] Resident [facility]  [phone]

## 2021-11-14 NOTE — Progress Notes (Signed)
PROGRESS NOTE  Connie Campbell JFH:[id number] DOB: 1951-01-03 DOA: 11/09/2021 PCP: Lilian Coma., MD  HPI/Recap of past 24 hours: Connie Campbell is a 71 y.o. female with medical history significant of glaucoma, CVA and HTN presenting inability to walk, difficulty getting out of bed and dizziness that started 11/07/21. Pt was unable to lift her R leg and was shuffling. Of note, her first stroke led to R-sided deficits and her second stroke led to speech and cognitive issues. Her speech is unchanged from baseline.  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 In the ED, MRI showed acute CVA. Neurology consulted.  Patient admitted for further management.     Today, patient denies any new complaints.     Assessment/Plan: Principal Problem:   Acute CVA (cerebrovascular accident) ([place]) Active Problems:   Hypertension   Dyslipidemia   Hypothyroidism (acquired)   Class 1 obesity due to excess calories with body mass index (BMI) of 31.0 to 31.9 in adult   Chronic pain disorder   Seizure disorder as sequela of cerebrovascular accident ([place])   Small acute left frontal lobe infarct  Embolic versus cardiogenic source MRI showed above, MRA no large vessel occlusion Carotid Doppler with bilateral ICA with 1 to 39% stenosis Echo with EF of 60 to 65%, aortic valve sclerosis without stenosis Lower extremity Doppler negative for DVT LDL 90, A1c 8 Neurology consulted, plan for aspirin and Plavix x3 weeks, then Plavix alone.  Recommend loop recorder upon discharge to rule out underlying paroxysmal A. Fib, cardiology/EP consulted PT/OT/ST-recommend CIR   HTN BP stable Restart home ARB (substituted)   HLD LDL 90 Lipitor 40 mg daily (pt reports it worsens her dystonia the last time she took it, but will attempt taking again)   DM type 2, uncontrolled A1c 8 Hold home PO medications (Glucophage, Jardiance) SSI, levemir, Accu-Cheks, hypoglycemic protocol   Seizure disorder as  sequela of cerebrovascular accident  Continue Keppra   Chronic pain disorder Continue Percocet, Baclofen   Obesity Lifestyle modification advised   Hypothyroidism (acquired) Continue Synthroid        Malnutrition Type:  Nutrition Problem: Increased nutrient needs Etiology: acute illness   Malnutrition Characteristics:  Signs/Symptoms: estimated needs   Nutrition Interventions:  Interventions: MVI    Estimated body mass index is 32.53 kg/m as calculated from the following:   Height as of this encounter: 5' 3"  (1.6 m).   Weight as of this encounter: 83.3 kg.     Code Status: Full  Family Communication: None at bedside  Disposition Plan: Status is: Inpatient Remains inpatient appropriate because: Level of care    Consultants: Neurology  Procedures: None  Antimicrobials: None  DVT prophylaxis: Lovenox   Objective: Vitals:   11/14/21 0451 11/14/21 0806 11/14/21 1200 11/14/21 1555  BP: (!) 145/88 116/69 107/65 122/75  Pulse: 76 69 75 79  Resp: 16 18 18 16   Temp: 98.9 F (37.2 C) 97.7 F (36.5 C) (!) 97.5 F (36.4 C) 98 F (36.7 C)  TempSrc: Oral Oral Oral Oral  SpO2: 98% 96% 98% 94%  Weight:      Height:        Intake/Output Summary (Last 24 hours) at 11/14/2021 1842 Last data filed at 11/14/2021 0900 Gross per 24 hour  Intake 200 ml  Output --  Net 200 ml   Filed Weights   11/09/21 0752 11/09/21 1502  Weight: 85.1 kg 83.3 kg    Exam: General: NAD, poor dentition  Cardiovascular: S1, S2 present Respiratory: CTAB  Abdomen: Soft, nontender, nondistended, bowel sounds present Musculoskeletal: No bilateral pedal edema noted Skin: Normal Psychiatry: Normal mood  Neurology: 4/5 strength in LLE and LUE, 3/5 in RLE and 4/5 in RUE    Data Reviewed: CBC: Recent Labs  Lab 11/09/21 0800 11/11/21 0239 11/12/21 0038  WBC 8.0 6.6 7.3  NEUTROABS 5.3 3.8 3.9  HGB 14.6 13.7 14.1  HCT 44.6 42.0 43.5  MCV 88.7 87.3 87.0  PLT 251 230  161   Basic Metabolic Panel: Recent Labs  Lab 11/09/21 0800 11/11/21 0239 11/12/21 0038  NA 138 135 136  K 4.3 4.1 4.4  CL 108 103 101  CO2 21* 22 24  GLUCOSE 161* 158* 177*  BUN 22 20 21   CREATININE 0.83 0.85 0.85  CALCIUM 8.5* 9.0 9.1   GFR: Estimated Creatinine Clearance: 63.9 mL/min (by C-G formula based on SCr of 0.85 mg/dL). Liver Function Tests: Recent Labs  Lab 11/09/21 0800  AST 21  ALT 24  ALKPHOS 82  BILITOT 0.4  PROT 6.6  ALBUMIN 3.2*   No results for input(s): LIPASE, AMYLASE in the last 168 hours. No results for input(s): AMMONIA in the last 168 hours. Coagulation Profile: Recent Labs  Lab 11/09/21 0800  INR 1.0   Cardiac Enzymes: No results for input(s): CKTOTAL, CKMB, CKMBINDEX, TROPONINI in the last 168 hours. BNP (last 3 results) No results for input(s): PROBNP in the last 8760 hours. HbA1C: No results for input(s): HGBA1C in the last 72 hours.  CBG: Recent Labs  Lab 11/13/21 2054 11/14/21 0627 11/14/21 0808 11/14/21 1205 11/14/21 1645  GLUCAP 254* 157* 144* 213* 150*   Lipid Profile: No results for input(s): CHOL, HDL, LDLCALC, TRIG, CHOLHDL, LDLDIRECT in the last 72 hours.  Thyroid Function Tests: No results for input(s): TSH, T4TOTAL, FREET4, T3FREE, THYROIDAB in the last 72 hours. Anemia Panel: No results for input(s): VITAMINB12, FOLATE, FERRITIN, TIBC, IRON, RETICCTPCT in the last 72 hours. Urine analysis:    Component Value Date/Time   COLORURINE YELLOW 11/09/2021 0801   APPEARANCEUR CLEAR 11/09/2021 0801   LABSPEC 1.025 11/09/2021 0801   PHURINE 5.5 11/09/2021 0801   GLUCOSEU >=500 (A) 11/09/2021 0801   HGBUR NEGATIVE 11/09/2021 0801   BILIRUBINUR NEGATIVE 11/09/2021 0801   KETONESUR NEGATIVE 11/09/2021 0801   PROTEINUR NEGATIVE 11/09/2021 0801   NITRITE NEGATIVE 11/09/2021 0801   LEUKOCYTESUR NEGATIVE 11/09/2021 0801   Sepsis Labs: @LABRCNTIP (procalcitonin:4,lacticidven:4)  ) Recent Results (from the past 240  hour(s))  Resp Panel by RT-PCR (Flu A&B, Covid) Nasopharyngeal Swab     Status: None   Collection Time: 11/09/21  8:00 AM   Specimen: Nasopharyngeal Swab; Nasopharyngeal(NP) swabs in vial transport medium  Result Value Ref Range Status   SARS Coronavirus 2 by RT PCR NEGATIVE NEGATIVE Final    Comment: (NOTE) SARS-CoV-2 target nucleic acids are NOT DETECTED.  The SARS-CoV-2 RNA is generally detectable in upper respiratory specimens during the acute phase of infection. The lowest concentration of SARS-CoV-2 viral copies this assay can detect is 138 copies/mL. A negative result does not preclude SARS-Cov-2 infection and should not be used as the sole basis for treatment or other patient management decisions. A negative result may occur with  improper specimen collection/handling, submission of specimen other than nasopharyngeal swab, presence of viral mutation(s) within the areas targeted by this assay, and inadequate number of viral copies(<138 copies/mL). A negative result must be combined with clinical observations, patient history, and epidemiological information. The expected result is Negative.  Fact Sheet for Patients:  EntrepreneurPulse.com.au  Fact Sheet for Healthcare Providers:  IncredibleEmployment.be  This test is no t yet approved or cleared by the [place] FDA and  has been authorized for detection and/or diagnosis of SARS-CoV-2 by FDA under an Emergency Use Authorization (EUA). This EUA will remain  in effect (meaning this test can be used) for the duration of the COVID-19 declaration under Section 564(b)(1) of the Act, 21 U.S.C.section 360bbb-3(b)(1), unless the authorization is terminated  or revoked sooner.       Influenza A by PCR NEGATIVE NEGATIVE Final   Influenza B by PCR NEGATIVE NEGATIVE Final    Comment: (NOTE) The Xpert Xpress SARS-CoV-2/FLU/RSV plus assay is intended as an aid in the diagnosis of influenza from  Nasopharyngeal swab specimens and should not be used as a sole basis for treatment. Nasal washings and aspirates are unacceptable for Xpert Xpress SARS-CoV-2/FLU/RSV testing.  Fact Sheet for Patients: EntrepreneurPulse.com.au  Fact Sheet for Healthcare Providers: IncredibleEmployment.be  This test is not yet approved or cleared by the [place] FDA and has been authorized for detection and/or diagnosis of SARS-CoV-2 by FDA under an Emergency Use Authorization (EUA). This EUA will remain in effect (meaning this test can be used) for the duration of the COVID-19 declaration under Section 564(b)(1) of the Act, 21 U.S.C. section 360bbb-3(b)(1), unless the authorization is terminated or revoked.  Performed at [facility], [place] [address], [place], [place] [address]       Studies: No results found.  Scheduled Meds:  aspirin EC  81 mg Oral Daily   atorvastatin  40 mg Oral Daily   clopidogrel  75 mg Oral Daily   enoxaparin (LOVENOX) injection  40 mg Subcutaneous Daily   insulin aspart  0-15 Units Subcutaneous TID WC   insulin detemir  60 Units Subcutaneous QHS   irbesartan  75 mg Oral Daily   levETIRAcetam  750 mg Oral BID   levothyroxine  100 mcg Oral QAC breakfast   multivitamin with minerals  1 tablet Oral Daily   timolol  1 drop Right Eye BID    Continuous Infusions:     LOS: 5 days     Alma Friendly, MD [facility]  If 7PM-7AM, please contact night-coverage [email or web address] 11/14/2021, 6:42 PM

## 2021-11-14 NOTE — Progress Notes (Signed)
Physical Therapy Treatment Patient Details Name: Connie Campbell MRN: [id number] DOB: 1951-09-28 Today's Date: 11/14/2021   History of Present Illness Pt is a 71 y/o. female who presents with dizziness and difficulty moving her right arm and right leg more than baseline. MRI revealed small acute posterior left frontal lobe infarcts. PMH significant for CVA with residual right-sided deficits, trigeminal neuralgia, hypertension, diabetes.    PT Comments    Pt. Tolerated walking without assistive device and increased balance challenges well. She was able to perform forward and backwards walking, pivot turns, and forwards walking with focused head turns with min-mod difficulty. She continues to be limited by R LE strength and control, R hip discomfort, and dizziness throughout the session. Pt. Would benefit from skilled PT to continue to address gait and balance deficits. Plan to move towards inpatient rehab still remains the same. PT to follow up acutely as able.    Recommendations for follow up therapy are one component of a multi-disciplinary discharge planning process, led by the attending physician.  Recommendations may be updated based on patient status, additional functional criteria and insurance authorization.  Follow Up Recommendations  Acute inpatient rehab (3hours/day)     Assistance Recommended at Discharge    Patient can return home with the following Assistance with cooking/housework;Assist for transportation;Help with stairs or ramp for entrance;A little help with bathing/dressing/bathroom;A little help with walking and/or transfers   Equipment Recommendations  None recommended by PT    Recommendations for Other Services       Precautions / Restrictions       Mobility  Bed Mobility               General bed mobility comments: Up in chair upon PT arrival.    Transfers Overall transfer level: Needs assistance Equipment used: None Transfers: Sit to/from Stand Sit  to Stand: Min guard           General transfer comment: Min guard for transfer able to self steady    Ambulation/Gait Ambulation/Gait assistance: Min guard, Min assist Gait Distance (Feet): 150 Feet Assistive device: None Gait Pattern/deviations: Decreased step length - right, Decreased step length - left, Decreased stride length, Decreased dorsiflexion - right, Shuffle, Step-through pattern, Wide base of support Gait velocity: decreased         Stairs             Wheelchair Mobility    Modified Rankin (Stroke Patients Only) Modified Rankin (Stroke Patients Only) Pre-Morbid Rankin Score: No significant disability     Balance Overall balance assessment: Needs assistance Sitting-balance support: No upper extremity supported, Feet supported Sitting balance-Leahy Scale: Good     Standing balance support: During functional activity, No upper extremity supported   Standing balance comment: Able to walk and stand w/o UE support, UE high guard position             High level balance activites: Direction changes, Turns, Head turns, Backward walking              Cognition Arousal/Alertness: Awake/alert Behavior During Therapy: WFL for tasks assessed/performed Overall Cognitive Status: Impaired/Different from baseline Area of Impairment: Safety/judgement, Problem solving                         Safety/Judgement: Decreased awareness of safety   Problem Solving: Requires verbal cues          Exercises      General Comments General comments (skin integrity, edema, etc.):  Spouse and daughter present during session. Overall improvement with general walking but still has difficulty adjust to balance challenges.      Pertinent Vitals/Pain Pain Assessment Pain Assessment: Faces Faces Pain Scale: Hurts little more Pain Location: the R LE and hip Pain Descriptors / Indicators: Sore, Aching Pain Intervention(s): Limited activity within  patient's tolerance, Monitored during session    Home Living                          Prior Function            PT Goals (current goals can now be found in the care plan section) Acute Rehab PT Goals Patient Stated Goal: to return home to care for her husband PT Goal Formulation: With patient/family Time For Goal Achievement: 11/24/21 Potential to Achieve Goals: Good Progress towards PT goals: Progressing toward goals    Frequency    Min 4X/week      PT Plan Current plan remains appropriate    Co-evaluation              AM-PAC PT "6 Clicks" Mobility   Outcome Measure  Help needed turning from your back to your side while in a flat bed without using bedrails?: A Little Help needed moving from lying on your back to sitting on the side of a flat bed without using bedrails?: A Little Help needed moving to and from a bed to a chair (including a wheelchair)?: A Little Help needed standing up from a chair using your arms (e.g., wheelchair or bedside chair)?: A Little Help needed to walk in hospital room?: A Little Help needed climbing 3-5 steps with a railing? : A Lot 6 Click Score: 17    End of Session Equipment Utilized During Treatment: Gait belt Activity Tolerance: Patient tolerated treatment well Patient left: in chair;with call bell/phone within reach;with chair alarm set;with family/visitor present Nurse Communication: Mobility status PT Visit Diagnosis: Unsteadiness on feet (R26.81);Other abnormalities of gait and mobility (R26.89);Muscle weakness (generalized) (M62.81);Dizziness and giddiness (R42)     Time: 1106-1130 PT Time Calculation (min) (ACUTE ONLY): 24 min  Charges:  $Gait Training: 8-22 mins $Neuromuscular Re-education: 8-22 mins                     Thermon Leyland, SPT Acute Rehab Services    Thermon Leyland 11/14/2021, 1:44 PM

## 2021-11-14 NOTE — Progress Notes (Signed)
Inpatient Rehab Admissions Coordinator:   I have not yet received insurance auth for CIR. I will follow for potential admission pending insurance auth.   Clemens Catholic, [place], [place] Admissions Coordinator  [phone] ([place]) [phone] (office)

## 2021-11-14 NOTE — Plan of Care (Signed)
°  Problem: Education: Goal: Knowledge of disease or condition will improve Outcome: Progressing

## 2021-11-15 ENCOUNTER — Encounter (HOSPITAL_COMMUNITY): Payer: Self-pay | Admitting: Internal Medicine

## 2021-11-15 ENCOUNTER — Inpatient Hospital Stay (HOSPITAL_COMMUNITY)
Admission: RE | Admit: 2021-11-15 | Discharge: 2021-11-21 | DRG: 057 | Disposition: A | Discharge status: Home Health | Payer: Medicare HMO | Source: Intra-hospital | Attending: Physical Medicine and Rehabilitation | Admitting: Physical Medicine and Rehabilitation

## 2021-11-15 ENCOUNTER — Encounter (HOSPITAL_COMMUNITY)
Admission: EM | Disposition: A | Discharge status: Rehabilitation Facility | Payer: Self-pay | Source: Home / Self Care | Attending: Internal Medicine

## 2021-11-15 DIAGNOSIS — E119 Type 2 diabetes mellitus without complications: Secondary | ICD-10-CM | POA: Diagnosis present

## 2021-11-15 DIAGNOSIS — E871 Hypo-osmolality and hyponatremia: Secondary | ICD-10-CM | POA: Diagnosis present

## 2021-11-15 DIAGNOSIS — I69392 Facial weakness following cerebral infarction: Secondary | ICD-10-CM | POA: Diagnosis not present

## 2021-11-15 DIAGNOSIS — G249 Dystonia, unspecified: Secondary | ICD-10-CM | POA: Diagnosis present

## 2021-11-15 DIAGNOSIS — E669 Obesity, unspecified: Secondary | ICD-10-CM | POA: Diagnosis present

## 2021-11-15 DIAGNOSIS — K900 Celiac disease: Secondary | ICD-10-CM | POA: Diagnosis present

## 2021-11-15 DIAGNOSIS — G40909 Epilepsy, unspecified, not intractable, without status epilepticus: Secondary | ICD-10-CM | POA: Diagnosis present

## 2021-11-15 DIAGNOSIS — Z90710 Acquired absence of both cervix and uterus: Secondary | ICD-10-CM | POA: Diagnosis not present

## 2021-11-15 DIAGNOSIS — I63422 Cerebral infarction due to embolism of left anterior cerebral artery: Secondary | ICD-10-CM | POA: Diagnosis not present

## 2021-11-15 DIAGNOSIS — Z7984 Long term (current) use of oral hypoglycemic drugs: Secondary | ICD-10-CM

## 2021-11-15 DIAGNOSIS — G8929 Other chronic pain: Secondary | ICD-10-CM | POA: Diagnosis present

## 2021-11-15 DIAGNOSIS — G4700 Insomnia, unspecified: Secondary | ICD-10-CM | POA: Diagnosis present

## 2021-11-15 DIAGNOSIS — E039 Hypothyroidism, unspecified: Secondary | ICD-10-CM | POA: Diagnosis present

## 2021-11-15 DIAGNOSIS — I69351 Hemiplegia and hemiparesis following cerebral infarction affecting right dominant side: Principal | ICD-10-CM

## 2021-11-15 DIAGNOSIS — Z8249 Family history of ischemic heart disease and other diseases of the circulatory system: Secondary | ICD-10-CM | POA: Diagnosis not present

## 2021-11-15 DIAGNOSIS — I69398 Other sequelae of cerebral infarction: Secondary | ICD-10-CM | POA: Diagnosis not present

## 2021-11-15 DIAGNOSIS — Z79890 Hormone replacement therapy (postmenopausal): Secondary | ICD-10-CM | POA: Diagnosis not present

## 2021-11-15 DIAGNOSIS — Z79899 Other long term (current) drug therapy: Secondary | ICD-10-CM | POA: Diagnosis not present

## 2021-11-15 DIAGNOSIS — G894 Chronic pain syndrome: Secondary | ICD-10-CM | POA: Diagnosis not present

## 2021-11-15 DIAGNOSIS — Z794 Long term (current) use of insulin: Secondary | ICD-10-CM

## 2021-11-15 DIAGNOSIS — Z6832 Body mass index (BMI) 32.0-32.9, adult: Secondary | ICD-10-CM | POA: Diagnosis not present

## 2021-11-15 DIAGNOSIS — I10 Essential (primary) hypertension: Secondary | ICD-10-CM | POA: Diagnosis present

## 2021-11-15 DIAGNOSIS — E785 Hyperlipidemia, unspecified: Secondary | ICD-10-CM | POA: Diagnosis present

## 2021-11-15 DIAGNOSIS — G243 Spasmodic torticollis: Secondary | ICD-10-CM

## 2021-11-15 DIAGNOSIS — I639 Cerebral infarction, unspecified: Secondary | ICD-10-CM

## 2021-11-15 DIAGNOSIS — R42 Dizziness and giddiness: Secondary | ICD-10-CM | POA: Diagnosis not present

## 2021-11-15 HISTORY — PX: LOOP RECORDER INSERTION: EP1214

## 2021-11-15 LAB — CBC WITH DIFFERENTIAL/PLATELET
Abs Immature Granulocytes: 0.03 10*3/uL (ref 0.00–0.07)
Basophils Absolute: 0.1 10*3/uL (ref 0.0–0.1)
Basophils Relative: 1 %
Eosinophils Absolute: 0.5 10*3/uL (ref 0.0–0.5)
Eosinophils Relative: 7 %
HCT: 44.8 % (ref 36.0–46.0)
Hemoglobin: 14.8 g/dL (ref 12.0–15.0)
Immature Granulocytes: 0 %
Lymphocytes Relative: 23 %
Lymphs Abs: 1.7 10*3/uL (ref 0.7–4.0)
MCH: 28.7 pg (ref 26.0–34.0)
MCHC: 33 g/dL (ref 30.0–36.0)
MCV: 86.8 fL (ref 80.0–100.0)
Monocytes Absolute: 0.7 10*3/uL (ref 0.1–1.0)
Monocytes Relative: 10 %
Neutro Abs: 4.1 10*3/uL (ref 1.7–7.7)
Neutrophils Relative %: 59 %
Platelets: 249 10*3/uL (ref 150–400)
RBC: 5.16 MIL/uL — ABNORMAL HIGH (ref 3.87–5.11)
RDW: 13.6 % (ref 11.5–15.5)
WBC: 7.1 10*3/uL (ref 4.0–10.5)
nRBC: 0 % (ref 0.0–0.2)

## 2021-11-15 LAB — BASIC METABOLIC PANEL
Anion gap: 10 (ref 5–15)
BUN: 24 mg/dL — ABNORMAL HIGH (ref 8–23)
CO2: 24 mmol/L (ref 22–32)
Calcium: 9.1 mg/dL (ref 8.9–10.3)
Chloride: 100 mmol/L (ref 98–111)
Creatinine, Ser: 0.9 mg/dL (ref 0.44–1.00)
GFR, Estimated: >60 mL/min (ref >60)
Glucose, Bld: 231 mg/dL — ABNORMAL HIGH (ref 70–99)
Potassium: 4.2 mmol/L (ref 3.5–5.1)
Sodium: 134 mmol/L — ABNORMAL LOW (ref 135–145)

## 2021-11-15 LAB — GLUCOSE, CAPILLARY
Glucose-Capillary: 169 mg/dL — ABNORMAL HIGH (ref 70–99)
Glucose-Capillary: 178 mg/dL — ABNORMAL HIGH (ref 70–99)
Glucose-Capillary: 299 mg/dL — ABNORMAL HIGH (ref 70–99)

## 2021-11-15 SURGERY — LOOP RECORDER INSERTION

## 2021-11-15 MED ORDER — ATORVASTATIN CALCIUM 40 MG PO TABS: 40.0000 mg | ORAL_TABLET | Freq: Every day | ORAL | Status: DC

## 2021-11-15 MED ORDER — SEMAGLUTIDE(0.25 OR 0.5MG/DOS) 2 MG/1.5ML ~~LOC~~ SOPN
0.5000 mg | PEN_INJECTOR | SUBCUTANEOUS | Status: DC
  Administered 2021-11-16: 0.5 mg via SUBCUTANEOUS

## 2021-11-15 MED ORDER — BACLOFEN 10 MG PO TABS: 10.0000 mg | ORAL_TABLET | Freq: Every day | ORAL | Status: DC

## 2021-11-15 MED ORDER — FLEET ENEMA 7-19 GM/118ML RE ENEM: 1.0000 | ENEMA | Freq: Once | RECTAL | Status: DC | PRN

## 2021-11-15 MED ORDER — ASPIRIN 81 MG PO TBEC: 81.0000 mg | DELAYED_RELEASE_TABLET | Freq: Every day | ORAL | 0 refills | Status: DC

## 2021-11-15 MED ORDER — ASPIRIN EC 81 MG PO TBEC
81.0000 mg | DELAYED_RELEASE_TABLET | Freq: Every day | ORAL | Status: DC
  Administered 2021-11-16 – 2021-11-21 (×6): 81 mg via ORAL
  Filled 2021-11-15 (×6): qty 1

## 2021-11-15 MED ORDER — INSULIN DETEMIR 100 UNIT/ML ~~LOC~~ SOLN
60.0000 [IU] | Freq: Every day | SUBCUTANEOUS | Status: DC
  Administered 2021-11-15 – 2021-11-20 (×6): 60 [IU] via SUBCUTANEOUS
  Filled 2021-11-15 (×7): qty 0.6

## 2021-11-15 MED ORDER — ALUM & MAG HYDROXIDE-SIMETH 200-200-20 MG/5ML PO SUSP: 30.0000 mL | ORAL | Status: DC | PRN

## 2021-11-15 MED ORDER — ADULT MULTIVITAMIN W/MINERALS CH
1.0000 | ORAL_TABLET | Freq: Every day | ORAL | Status: DC
  Administered 2021-11-16 – 2021-11-21 (×6): 1 via ORAL
  Filled 2021-11-15 (×6): qty 1

## 2021-11-15 MED ORDER — CLOPIDOGREL BISULFATE 75 MG PO TABS: 75.0000 mg | ORAL_TABLET | Freq: Every day | ORAL | Status: DC

## 2021-11-15 MED ORDER — BACLOFEN 5 MG HALF TABLET: 5.0000 mg | ORAL_TABLET | Freq: Three times a day (TID) | ORAL | Status: DC | PRN

## 2021-11-15 MED ORDER — LEVETIRACETAM 500 MG PO TABS
750.0000 mg | ORAL_TABLET | Freq: Two times a day (BID) | ORAL | Status: DC
  Administered 2021-11-15 – 2021-11-21 (×12): 750 mg via ORAL
  Filled 2021-11-15 (×12): qty 1

## 2021-11-15 MED ORDER — LIVING WELL WITH DIABETES BOOK
Freq: Once | Status: AC
  Filled 2021-11-15: qty 1

## 2021-11-15 MED ORDER — IRBESARTAN 75 MG PO TABS
75.0000 mg | ORAL_TABLET | Freq: Every day | ORAL | Status: DC
  Administered 2021-11-16 – 2021-11-17 (×2): 75 mg via ORAL
  Filled 2021-11-15 (×3): qty 1

## 2021-11-15 MED ORDER — ENOXAPARIN SODIUM 40 MG/0.4ML IJ SOSY
40.0000 mg | PREFILLED_SYRINGE | INTRAMUSCULAR | Status: DC
  Administered 2021-11-16 – 2021-11-21 (×6): 40 mg via SUBCUTANEOUS
  Filled 2021-11-15 (×6): qty 0.4

## 2021-11-15 MED ORDER — BISACODYL 10 MG RE SUPP: 10.0000 mg | Freq: Every day | RECTAL | Status: DC | PRN

## 2021-11-15 MED ORDER — PROCHLORPERAZINE 25 MG RE SUPP: 12.5000 mg | Freq: Four times a day (QID) | RECTAL | Status: DC | PRN

## 2021-11-15 MED ORDER — CLOPIDOGREL BISULFATE 75 MG PO TABS
75.0000 mg | ORAL_TABLET | Freq: Every day | ORAL | Status: DC
  Administered 2021-11-16 – 2021-11-21 (×6): 75 mg via ORAL
  Filled 2021-11-15 (×7): qty 1

## 2021-11-15 MED ORDER — TIMOLOL MALEATE 0.5 % OP SOLN
1.0000 [drp] | Freq: Two times a day (BID) | OPHTHALMIC | Status: DC
  Administered 2021-11-15 – 2021-11-20 (×11): 1 [drp] via OPHTHALMIC

## 2021-11-15 MED ORDER — PROCHLORPERAZINE EDISYLATE 10 MG/2ML IJ SOLN: 5.0000 mg | Freq: Four times a day (QID) | INTRAMUSCULAR | Status: DC | PRN

## 2021-11-15 MED ORDER — INSULIN ASPART 100 UNIT/ML IJ SOLN
0.0000 [IU] | Freq: Three times a day (TID) | INTRAMUSCULAR | Status: DC
  Administered 2021-11-16: 2 [IU] via SUBCUTANEOUS
  Administered 2021-11-16: 3 [IU] via SUBCUTANEOUS
  Administered 2021-11-17: 1 [IU] via SUBCUTANEOUS
  Administered 2021-11-17: 3 [IU] via SUBCUTANEOUS
  Administered 2021-11-17 – 2021-11-20 (×4): 1 [IU] via SUBCUTANEOUS

## 2021-11-15 MED ORDER — LEVOTHYROXINE SODIUM 100 MCG PO TABS
100.0000 ug | ORAL_TABLET | Freq: Every day | ORAL | Status: DC
  Administered 2021-11-16 – 2021-11-21 (×6): 100 ug via ORAL
  Filled 2021-11-15 (×6): qty 1

## 2021-11-15 MED ORDER — MAGNESIUM GLUCONATE 500 MG PO TABS
250.0000 mg | ORAL_TABLET | Freq: Every day | ORAL | Status: DC
  Administered 2021-11-15: 250 mg via ORAL
  Filled 2021-11-15 (×2): qty 1

## 2021-11-15 MED ORDER — POLYETHYLENE GLYCOL 3350 17 G PO PACK: 17.0000 g | PACK | Freq: Every day | ORAL | Status: DC | PRN

## 2021-11-15 MED ORDER — OXYCODONE-ACETAMINOPHEN 5-325 MG PO TABS
1.0000 | ORAL_TABLET | Freq: Four times a day (QID) | ORAL | Status: DC | PRN
  Administered 2021-11-15 – 2021-11-21 (×13): 1 via ORAL
  Filled 2021-11-15 (×14): qty 1

## 2021-11-15 MED ORDER — ACETAMINOPHEN 325 MG PO TABS
325.0000 mg | ORAL_TABLET | ORAL | Status: DC | PRN
  Administered 2021-11-20: 650 mg via ORAL
  Filled 2021-11-15 (×2): qty 2

## 2021-11-15 MED ORDER — METFORMIN HCL 500 MG PO TABS
500.0000 mg | ORAL_TABLET | Freq: Three times a day (TID) | ORAL | Status: DC
  Administered 2021-11-15 – 2021-11-16 (×3): 500 mg via ORAL
  Filled 2021-11-15 (×3): qty 1

## 2021-11-15 MED ORDER — INSULIN ASPART 100 UNIT/ML IJ SOLN
0.0000 [IU] | Freq: Every day | INTRAMUSCULAR | Status: DC
  Administered 2021-11-15: 3 [IU] via SUBCUTANEOUS
  Administered 2021-11-16: 2 [IU] via SUBCUTANEOUS

## 2021-11-15 MED ORDER — POLYVINYL ALCOHOL 1.4 % OP SOLN: 1.0000 [drp] | OPHTHALMIC | Status: DC | PRN

## 2021-11-15 MED ORDER — TRAZODONE HCL 50 MG PO TABS
25.0000 mg | ORAL_TABLET | Freq: Every evening | ORAL | Status: DC | PRN
  Administered 2021-11-16 – 2021-11-17 (×2): 50 mg via ORAL
  Filled 2021-11-15 (×3): qty 1

## 2021-11-15 MED ORDER — BACLOFEN 10 MG PO TABS
5.0000 mg | ORAL_TABLET | Freq: Once | ORAL | Status: AC
  Administered 2021-11-15: 5 mg via ORAL
  Filled 2021-11-15: qty 1

## 2021-11-15 MED ORDER — GUAIFENESIN-DM 100-10 MG/5ML PO SYRP: 5.0000 mL | ORAL_SOLUTION | Freq: Four times a day (QID) | ORAL | Status: DC | PRN

## 2021-11-15 MED ORDER — BLOOD PRESSURE CONTROL BOOK
Freq: Once | Status: AC
  Filled 2021-11-15: qty 1

## 2021-11-15 MED ORDER — LIDOCAINE-EPINEPHRINE 1 %-1:100000 IJ SOLN
INTRAMUSCULAR | Status: AC
  Filled 2021-11-15: qty 1

## 2021-11-15 MED ORDER — PROCHLORPERAZINE MALEATE 5 MG PO TABS: 5.0000 mg | ORAL_TABLET | Freq: Four times a day (QID) | ORAL | Status: DC | PRN

## 2021-11-15 MED ORDER — DIPHENHYDRAMINE HCL 12.5 MG/5ML PO ELIX: 12.5000 mg | ORAL_SOLUTION | Freq: Four times a day (QID) | ORAL | Status: DC | PRN

## 2021-11-15 MED ORDER — LIDOCAINE-EPINEPHRINE 1 %-1:100000 IJ SOLN
INTRAMUSCULAR | Status: DC | PRN
  Administered 2021-11-15: 20 mL

## 2021-11-15 SURGICAL SUPPLY — 2 items
PACK LOOP INSERTION (CUSTOM PROCEDURE TRAY) ×2 IMPLANT
SYSTEM MONITOR REVEAL LINQ II (Prosthesis & Implant Heart) ×1 IMPLANT

## 2021-11-15 NOTE — H&P (Shared)
Physical Medicine and Rehabilitation Admission H&P    Chief Complaint  Patient presents with   Functional deficits due to stroke.     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   MRI brain done revealing small acute right frontal infarcts with chronic multiple cerebral and cerebellar strokes. MRA brain negative for LVO. Carotid dopplers were negative for ICA stenosis. 2 D echo showed EF 60-65% with aortic sclerosis without stenosis. She reported not taking ASA due to issues with gastritis.   Dr. Leonie Man felt that L-ACA stroke likely due to embolization from DVT v/s cardiogenic source. BLE dopplers were negative for DVT and she underwent TEE w/loop recorder placement today. Patient with resultant RLE weakness with dizziness affecting balance and mobility. CIR recommended due to functional decline.     Review of Systems  Constitutional:  Negative for chills and fever.  HENT:  Positive for hearing loss. Negative for tinnitus.   Eyes:  Negative for blurred vision and discharge.  Respiratory:  Negative for cough and shortness of breath.   Cardiovascular:  Negative for chest pain and palpitations.  Gastrointestinal:  Positive for abdominal pain (still comes and goes). Negative for constipation, heartburn and nausea.  Genitourinary:  Negative for dysuria and urgency.  Musculoskeletal:  Positive for myalgias and neck pain.  Skin:  Negative for itching and rash.  Neurological:  Positive for dizziness, sensory change, speech change, focal weakness and headaches.  Psychiatric/Behavioral:  Negative for memory loss. The patient has insomnia (chronic get up for a couple of  hours every night.).     Past Medical History:  Diagnosis Date   Bilateral neck pain    with tension headaches at times.   Celiac disease    Chronic pain    BUE/BLE and constant.   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 Disabled since '93 due to neck/dystonia/dyslexia. Daughter and son-in law moved in last fall and help with housework. She reports that she has never smoked. She has never used smokeless tobacco. She reports alcohol use--1-2 x per year. She reports that she does not use drugs.   Allergies  Allergen Reactions   Aspirin Other (See Comments)    GI upset      Charentais Melon (French Melon) Other (See Comments)   Erythromycin Base Other (See Comments) and Rash   Ginkgo Biloba Other (See Comments) and Rash   Bactrim [Sulfamethoxazole-Trimethoprim]  Hives   Beta Vulgaris    Ciprofloxacin    Coconut Oil Other (See Comments)    seizure   Gluten Meal Diarrhea   Keflex [Cephalexin] Swelling   Peanut Butter Flavor Hives   Penicillins Swelling   Tramadol Other (See Comments)    Depression, GI discomfort   Ibuprofen Diarrhea    Other reaction(s): GI intolerance STOMACH PAINS bleeding Other reaction(s): GI intoleran    No medications prior to admission.    Home: Home Living Family/patient expects to be discharged to:: Private residence Living Arrangements:  Spouse/significant other, Children Available Help at Discharge: Family, Available PRN/intermittently Type of Home: House Home Access: [place]: One level, Other (Comment) (sunken living room with 4 stairs and HR on both sides) Bathroom Shower/Tub: Chiropodist: Standard Bathroom Accessibility: Yes Home Equipment: Conservation officer, nature (2 wheels), Cane - single point, Grab bars - tub/shower Additional Comments: spouse is disabled and patient is primary CG.  SIL and diaghter stay with them, work during the day at home, but can only provide home management, meals and community mobility.  Lives With: Spouse   Functional History: Prior Function Prior Level of Function : Independent/Modified Independent Mobility Comments: Walks with a SPC ADLs Comments: No assist per patient for ADL  Functional Status:  Mobility: Bed Mobility Overal bed mobility: Needs Assistance Bed Mobility: Supine to Sit Supine to sit: Supervision General bed mobility comments: verbal cues to initiate Transfers Overall transfer level: Needs assistance Equipment used: Rolling walker (2 wheels) Transfers: Sit to/from Stand Sit to Stand: Min guard Bed to/from chair/wheelchair/BSC transfer type:: Step pivot Step pivot transfers: Min guard General transfer comment: verbal cues to reach back before sitting Ambulation/Gait Ambulation/Gait assistance: Min guard, Min assist Gait Distance (Feet): 150 Feet Assistive device: None Gait Pattern/deviations: Decreased step length - right, Decreased step length - left, Decreased stride length, Decreased dorsiflexion - right, Shuffle, Step-through pattern, Wide base of support General Gait Details: Slow, pt. holds hands out to help balance, requires multiple standing breaks for aching hip or slight dizziness. Was able to walk bakwards with further decrease in stride length as well as ambulate with focus head turns and pivot turns. Gait velocity:  decreased Gait velocity interpretation: <1.31 ft/sec, indicative of household ambulator    ADL: ADL Overall ADL's : Needs assistance/impaired Grooming: Wash/dry hands, Wash/dry face, Oral care, Applying deodorant, Min guard, Standing Upper Body Bathing: Min guard, Standing Lower Body Bathing: Minimal assistance, Sit to/from stand Upper Body Dressing : Minimal assistance, Sitting Upper Body Dressing Details (indicate cue type and reason): down gown from home Lower Body Dressing: Min guard, Sit to/from stand Lower Body Dressing Details (indicate cue type and reason): changed socks Toilet Transfer: Min guard, Ambulation, Rolling walker (2 wheels) Toilet Transfer Details (indicate cue type and reason): simulated in room Toileting- Clothing Manipulation and Hygiene: Min guard, Sit to/from stand Functional mobility during ADLs: Min guard, Rolling walker (2 wheels) General ADL Comments: performed self care standing and bathed feet seated with no LOB  Cognition: Cognition Overall Cognitive Status: Impaired/Different from baseline Arousal/Alertness: Awake/alert Orientation Level: Oriented X4 Year: 2023 Day of Week: Incorrect Attention: Focused Focused Attention: Appears intact Memory: Impaired Memory Impairment: Storage deficit, Decreased recall of new information, Decreased short term memory Decreased Short Term Memory: Verbal basic Awareness: Appears intact Problem Solving: Appears intact Safety/Judgment: Appears intact Cognition Arousal/Alertness: Awake/alert Behavior During Therapy: WFL for tasks assessed/performed Overall Cognitive Status: Impaired/Different from baseline Area of Impairment: Safety/judgement, Problem solving Safety/Judgement: Decreased awareness  of safety Problem Solving: Requires verbal cues General Comments: demonstrated good safety with walker use  Blood pressure 126/81, pulse 77, temperature 98.1 F (36.7 C), resp. rate 17, height 5' 3"  (1.6 m), weight  83.3 kg, SpO2 99 %. Physical Exam Vitals and nursing note reviewed.  Constitutional:      Appearance: Normal appearance.     Comments: WNWD female. NAD  Cardiovascular:     Rate and Rhythm: Normal rate.     Comments: Dry dressing on left chest. Neurological:     Mental Status: She is alert and oriented to person, place, and time.     Comments: Decreased hearing. Right facial weakness without mild dysarthria. Right sided weakness with sensory deficits. Able to follow simple 1 and 2 step simple motor commands.    Results for orders placed or performed during the hospital encounter of 11/09/21 (from the past 48 hour(s))  Glucose, capillary     Status: Abnormal   Collection Time: 11/13/21  8:54 PM  Result Value Ref Range   Glucose-Capillary 254 (H) 70 - 99 mg/dL    Comment: Glucose reference range applies only to samples taken after fasting for at least 8 hours.  Glucose, capillary     Status: Abnormal   Collection Time: 11/14/21  6:27 AM  Result Value Ref Range   Glucose-Capillary 157 (H) 70 - 99 mg/dL    Comment: Glucose reference range applies only to samples taken after fasting for at least 8 hours.  Glucose, capillary     Status: Abnormal   Collection Time: 11/14/21  8:08 AM  Result Value Ref Range   Glucose-Capillary 144 (H) 70 - 99 mg/dL    Comment: Glucose reference range applies only to samples taken after fasting for at least 8 hours.  Glucose, capillary     Status: Abnormal   Collection Time: 11/14/21 12:05 PM  Result Value Ref Range   Glucose-Capillary 213 (H) 70 - 99 mg/dL    Comment: Glucose reference range applies only to samples taken after fasting for at least 8 hours.  Glucose, capillary     Status: Abnormal   Collection Time: 11/14/21  4:45 PM  Result Value Ref Range   Glucose-Capillary 150 (H) 70 - 99 mg/dL    Comment: Glucose reference range applies only to samples taken after fasting for at least 8 hours.  Glucose, capillary     Status: Abnormal   Collection  Time: 11/14/21  9:40 PM  Result Value Ref Range   Glucose-Capillary 214 (H) 70 - 99 mg/dL    Comment: Glucose reference range applies only to samples taken after fasting for at least 8 hours.   Comment 1 Notify RN    Comment 2 Document in Chart   CBC with Differential/Platelet     Status: Abnormal   Collection Time: 11/15/21  2:20 AM  Result Value Ref Range   WBC 7.1 4.0 - 10.5 K/uL   RBC 5.16 (H) 3.87 - 5.11 MIL/uL   Hemoglobin 14.8 12.0 - 15.0 g/dL   HCT 44.8 36.0 - 46.0 %   MCV 86.8 80.0 - 100.0 fL   MCH 28.7 26.0 - 34.0 pg   MCHC 33.0 30.0 - 36.0 g/dL   RDW 13.6 11.5 - 15.5 %   Platelets 249 150 - 400 K/uL   nRBC 0.0 0.0 - 0.2 %   Neutrophils Relative % 59 %   Neutro Abs 4.1 1.7 - 7.7 K/uL   Lymphocytes Relative 23 %   Lymphs Abs 1.7  0.7 - 4.0 K/uL   Monocytes Relative 10 %   Monocytes Absolute 0.7 0.1 - 1.0 K/uL   Eosinophils Relative 7 %   Eosinophils Absolute 0.5 0.0 - 0.5 K/uL   Basophils Relative 1 %   Basophils Absolute 0.1 0.0 - 0.1 K/uL   Immature Granulocytes 0 %   Abs Immature Granulocytes 0.03 0.00 - 0.07 K/uL    Comment: Performed at [facility], [place] [address], [place], [place] [address]  Basic metabolic panel     Status: Abnormal   Collection Time: 11/15/21  2:20 AM  Result Value Ref Range   Sodium 134 (L) 135 - 145 mmol/L   Potassium 4.2 3.5 - 5.1 mmol/L   Chloride 100 98 - 111 mmol/L   CO2 24 22 - 32 mmol/L   Glucose, Bld 231 (H) 70 - 99 mg/dL    Comment: Glucose reference range applies only to samples taken after fasting for at least 8 hours.   BUN 24 (H) 8 - 23 mg/dL   Creatinine, Ser 0.90 0.44 - 1.00 mg/dL   Calcium 9.1 8.9 - 10.3 mg/dL   GFR, Estimated >60 >60 mL/min    Comment: (NOTE) Calculated using the CKD-EPI Creatinine Equation (2021)    Anion gap 10 5 - 15    Comment: Performed at [facility] [address] [place], [place] [address]  Glucose, capillary     Status: Abnormal   Collection Time: 11/15/21  6:04 AM  Result  Value Ref Range   Glucose-Capillary 169 (H) 70 - 99 mg/dL    Comment: Glucose reference range applies only to samples taken after fasting for at least 8 hours.  Glucose, capillary     Status: Abnormal   Collection Time: 11/15/21 11:24 AM  Result Value Ref Range   Glucose-Capillary 178 (H) 70 - 99 mg/dL    Comment: Glucose reference range applies only to samples taken after fasting for at least 8 hours.   No results found.    Blood pressure 126/81, pulse 77, temperature 98.1 F (36.7 C), resp. rate 17, height 5' 3"  (1.6 m), weight 83.3 kg, SpO2 99 %.  Medical Problem List and Plan: 1. Functional deficits secondary to ***  -patient may not shower for 3 days. Keep dressing on chest clean and dry.   -ELOS/Goals: *** 2.  Antithrombotics: -DVT/anticoagulation:  Pharmaceutical: Lovenox  -antiplatelet therapy: DAPT X 3 weeks followed by plavix alone.  3. Chronic pain with dystonia/Pain Management: Continue percocet prn --has insomnia and sleeps in recline. Gets up once a night for pain pill.    4. Mood: LCSW to follow for evaluation and support.   -antipsychotic agents: N/S 5. Neuropsych: This patient is capable of making decisions on her own behalf. 6. Skin/Wound Care: Routine pressure relief measures.  7. Fluids/Electrolytes/Nutrition: Monitor I/O. Check CMET in am.  8. HTN: Monitor BP TID- SBP goal <140  --continue Avapro (for micardis) 9.T2DM: Hgb A1C-8.0. Uncontrolled on Jardiance, Levemir 80U, metformin 850 mg BID, Ozempic weekly on Wed PTA.--->patient will have husband bring in ozempic to resume regimen.  --Continue levemir 60 units. Resume Metformin at 500 mg TID as BS poorly controlled.  --monitor BS ac/hs. Use SSI for elevated BS and titrate insulin and metformin to home dose.   10. Seizure d/o: Stable and controlled on Keppra 750 mg bid  11. Dyslipidemia: Now on Lipitor 40 mg/day has had worsening of dystonia with this and again for past 3 nights. --Willing to try scheduled  baclofen to see if this will help manage symptoms. 12. Hypothyroid: Managed on synthroid for supplement.   ***  Bary Leriche, PA-C 11/15/2021

## 2021-11-15 NOTE — Discharge Summary (Signed)
Physician Discharge Summary  Connie Campbell DJS:[id number] DOB: May 12, 1951 DOA: 11/09/2021  PCP: Lilian Coma., MD  Admit date: 11/09/2021 Discharge date: 11/15/2021  Admitted From: Home Disposition:  CIR  Discharge Condition:Stable CODE STATUS:FULL Diet recommendation: Heart Healthy  Brief/Interim Summary:  Connie Campbell is a 71 y.o. female with medical history significant of glaucoma, CVA and HTN presenting inability to walk, difficulty getting out of bed and dizziness that started 11/07/21. Pt was unable to lift her R leg and was shuffling. Of note, her first stroke led to R-sided deficits and her second stroke led to speech and cognitive issues.  She was admitted for a stroke work-up.  MRI done here showed small acute posterior left frontal lobe infarcts.  Neurology was following.  Stroke work-up completed.  PT/OT recommended CIR.  Medically stable for discharge today.  Following problems were addressed during her hospitalization:  Small acute left frontal lobe infarct  Embolic versus cardiogenic source MRI showed as above, MRA no large vessel occlusion Carotid Doppler with bilateral ICA with 1 to 39% stenosis Echo with EF of 60 to 65%, aortic valve sclerosis without stenosis Lower extremity Doppler negative for DVT LDL 90, A1c 8 Neurology consulted, plan for aspirin and Plavix x3 weeks, then Plavix alone.   She has also been started on Lipitor 40 mg daily.  Patient states she has statin intolerance and gets dystonia.  She is agreeable for continue Lipitor for now.  She should discuss this with neurology during outpatient follow-up Recommend loop recorder upon discharge to rule out underlying paroxysmal A. Fib, cardiology/EP consulted.  Cardiology putting loop recorder placement before discharge today. PT/OT/ST-recommended CIR   HTN BP stable Restart home ARB    HLD LDL 90 Lipitor 40 mg daily ,see above for discussion about possible statin intolerance   DM type 2, uncontrolled A1c  8 Continue home medications   Seizure disorder as sequela of cerebrovascular accident  Continue Keppra   Chronic pain disorder Continue Percocet, Baclofen   Obesity Lifestyle modification advised   Hypothyroidism (acquired) Continue Synthroid      Discharge Diagnoses:  Principal Problem:   Acute CVA (cerebrovascular accident) ([place]) Active Problems:   Hypertension   Dyslipidemia   Hypothyroidism (acquired)   Class 1 obesity due to excess calories with body mass index (BMI) of 31.0 to 31.9 in adult   Chronic pain disorder   Seizure disorder as sequela of cerebrovascular accident [facility])    Discharge Instructions  Discharge Instructions     Ambulatory referral to Neurology   Complete by: As directed    Follow up with stroke clinic NP (Jessica Vanschaick or Cecille Rubin, if both not available, consider Zachery Dauer, or Ahern) at [facility] in about 4 weeks. Thanks.   Diet - low sodium heart healthy   Complete by: As directed    Discharge instructions   Complete by: As directed    1)Please take prescribed medications as instructed 2) Follow up with [facility] neurology in 4 weeks.  Name and number the provider has been attached   Increase activity slowly   Complete by: As directed       Allergies as of 11/15/2021       Reactions   Aspirin Other (See Comments)   GI upset   Charentais Melon (french Melon) Other (See Comments)   Erythromycin Base Other (See Comments), Rash   Ginkgo Biloba Other (See Comments), Rash   Bactrim [sulfamethoxazole-trimethoprim] Hives   Beta Vulgaris    Ciprofloxacin    Coconut Oil  Other (See Comments)   seizure   Gluten Meal Diarrhea   Keflex [cephalexin] Swelling   Peanut Butter Flavor Hives   Penicillins Swelling   Tramadol Other (See Comments)   Depression, GI discomfort   Ibuprofen Diarrhea   Other reaction(s): GI intolerance STOMACH PAINS bleeding Other reaction(s): GI intoleran        Medication List     TAKE these  medications    aspirin 81 MG EC tablet Take 1 tablet (81 mg total) by mouth daily for 15 days. Swallow whole. Start taking on: November 16, 2021   atorvastatin 40 MG tablet Commonly known as: LIPITOR Take 1 tablet (40 mg total) by mouth daily. Start taking on: November 16, 2021   baclofen 10 MG tablet Commonly known as: LIORESAL Take 10 mg by mouth daily as needed for muscle spasms.   clopidogrel 75 MG tablet Commonly known as: PLAVIX Take 1 tablet (75 mg total) by mouth daily. Start taking on: November 16, 2021   Jardiance 25 MG Tabs tablet Generic drug: empagliflozin Take 25 mg by mouth daily.   Levemir FlexTouch 100 UNIT/ML FlexPen Generic drug: insulin detemir Inject 80 Units into the skin at bedtime.   levETIRAcetam 750 MG tablet Commonly known as: KEPPRA Take 750 mg by mouth 2 (two) times daily.   metFORMIN 850 MG tablet Commonly known as: GLUCOPHAGE Take 850 mg by mouth 3 (three) times daily.   multivitamin capsule Take 1 capsule by mouth daily.   oxyCODONE-acetaminophen 10-325 MG tablet Commonly known as: PERCOCET Take 1 tablet by mouth 4 (four) times daily as needed for pain.   Ozempic (0.25 or 0.5 MG/DOSE) 2 MG/1.5ML Sopn Generic drug: Semaglutide(0.25 or 0.5MG/DOS) Inject 0.5 mg into the skin once a week. Wednesday   simethicone 125 MG chewable tablet Commonly known as: MYLICON Chew 578 mg by mouth every 6 (six) hours as needed for flatulence.   Synthroid 100 MCG tablet Generic drug: levothyroxine Take 100 mcg by mouth daily.   telmisartan 40 MG tablet Commonly known as: MICARDIS Take 40 mg by mouth daily.   timolol 0.5 % ophthalmic solution Commonly known as: TIMOPTIC Place 1 drop into the right eye 2 (two) times daily.        Follow-up Information     [facility]. Schedule an appointment as soon as possible for a visit in 1 month(s).   Specialty: Neurology Why: stroke clinic Contact information: [place] [phone]               Allergies  Allergen Reactions   Aspirin Other (See Comments)    GI upset      Charentais Melon (French Melon) Other (See Comments)   Erythromycin Base Other (See Comments) and Rash   Ginkgo Biloba Other (See Comments) and Rash   Bactrim [Sulfamethoxazole-Trimethoprim] Hives   Beta Vulgaris    Ciprofloxacin    Coconut Oil Other (See Comments)    seizure   Gluten Meal Diarrhea   Keflex [Cephalexin] Swelling   Peanut Butter Flavor Hives   Penicillins Swelling   Tramadol Other (See Comments)    Depression, GI discomfort   Ibuprofen Diarrhea    Other reaction(s): GI intolerance STOMACH PAINS bleeding Other reaction(s): GI intoleran    Consultations: Neurology, EP   Procedures/Studies: MR ANGIO HEAD WO CONTRAST  Result Date: 11/09/2021 CLINICAL DATA:  Acute stroke on MRI brain EXAM: MRA HEAD WITHOUT CONTRAST TECHNIQUE: Angiographic images of the Circle of Willis  were acquired using MRA technique without intravenous contrast. COMPARISON:  No pertinent prior exam. FINDINGS: Anterior circulation: Intracranial internal carotid arteries are patent. Anterior and middle cerebral arteries are patent. 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 Posterior circulation: Intracranial vertebral arteries, basilar artery, and posterior cerebral arteries are patent. Fetal origin of the right PCA. Asymmetric relatively poor flow related enhancement of the right posterior communicating artery and posterior cerebral artery. IMPRESSION: No proximal intracranial vessel occlusion. Asymmetric poor flow related enhancement of right posterior communicating artery and posterior cerebral artery. May be on a technical basis given lack of acute findings in this distribution on the MRI. Electronically Signed   By: Macy Mis M.D.   On: 11/09/2021 14:29   MR BRAIN WO CONTRAST  Result Date: 11/09/2021 CLINICAL DATA:  Neuro  deficit, acute, stroke suspected. Right-sided weakness and dizziness >24 hours; hx of right side weakness from prior CVA. EXAM: MRI HEAD WITHOUT CONTRAST TECHNIQUE: Multiplanar, multiecho pulse sequences of the brain and surrounding structures were obtained without intravenous contrast. COMPARISON:  None. FINDINGS: Brain: There are small acute cortical and subcortical infarcts in the high posterior left frontal lobe. Small chronic cortical infarcts are noted in the parietal lobes and posterior right frontal lobe, and there are also multiple small chronic infarcts involving the right greater than left cerebellar hemispheres, thalami, and basal ganglia. T2 hyperintensities elsewhere in the cerebral white matter bilaterally are nonspecific but compatible with mild chronic small vessel ischemic disease. There is mild cerebral atrophy. No intracranial hemorrhage, mass, midline shift, or extra-axial fluid collection is identified. Vascular: Major intracranial vascular flow voids are preserved. Skull and upper cervical spine: Unremarkable bone marrow signal. Postoperative changes posteriorly in the upper cervical spine. Sinuses/Orbits: Bilateral cataract extraction. Complete opacification of the right maxillary sinus by complex material. Complete opacification of the right frontal sinus as well. Milder mucosal thickening in the ethmoid and sphenoid sinuses. Trace right mastoid fluid. Other: None. IMPRESSION: 1. Small acute posterior left frontal lobe infarcts. 2. Chronic ischemia with multiple old cerebral and cerebellar infarcts as above. Electronically Signed   By: Logan Bores M.D.   On: 11/09/2021 09:55   ECHOCARDIOGRAM COMPLETE  Result Date: 11/10/2021    ECHOCARDIOGRAM REPORT   Patient Name:   Connie Campbell Date of Exam: 11/10/2021 Medical Rec #:  [id number]    Height:       63.0 in Accession #:    [id number]   Weight:       183.6 lb Date of Birth:  Oct 05, 1951    BSA:          1.865 m Patient Age:    33 years     BP:            142/67 mmHg Patient Gender: F            HR:           76 bpm. Exam Location:  Inpatient Procedure: 2D Echo, Cardiac Doppler and Color Doppler Indications:    I63.9 STROKE  History:        Patient has no prior history of Echocardiogram examinations.                 Stroke; Risk Factors:Hypertension and Dyslipidemia.  Sonographer:    Beryle Beams Referring Phys: Kay  1. Left ventricular ejection fraction, by estimation, is 60 to 65%. The left ventricle has normal function. The left ventricle has no regional wall motion abnormalities. Left ventricular diastolic parameters were  normal.  2. Right ventricular systolic function is normal. The right ventricular size is normal. There is moderately elevated pulmonary artery systolic pressure.  3. The mitral valve is abnormal. Trivial mitral valve regurgitation. No evidence of mitral stenosis.  4. The aortic valve was not well visualized. There is mild calcification of the aortic valve. Aortic valve regurgitation is trivial. Aortic valve sclerosis is present, with no evidence of aortic valve stenosis.  5. The inferior vena cava is normal in size with greater than 50% respiratory variability, suggesting right atrial pressure of 3 mmHg. FINDINGS  Left Ventricle: Left ventricular ejection fraction, by estimation, is 60 to 65%. The left ventricle has normal function. The left ventricle has no regional wall motion abnormalities. The left ventricular internal cavity size was normal in size. There is  no left ventricular hypertrophy. Left ventricular diastolic parameters were normal. Right Ventricle: The right ventricular size is normal. No increase in right ventricular wall thickness. Right ventricular systolic function is normal. There is moderately elevated pulmonary artery systolic pressure. The tricuspid regurgitant velocity is 3.43 m/s, and with an assumed right atrial pressure of 3 mmHg, the estimated right ventricular systolic pressure  is 25.3 mmHg. Left Atrium: Left atrial size was normal in size. Right Atrium: Right atrial size was normal in size. Pericardium: There is no evidence of pericardial effusion. Mitral Valve: The mitral valve is abnormal. There is mild thickening of the mitral valve leaflet(s). There is mild calcification of the mitral valve leaflet(s). Trivial mitral valve regurgitation. No evidence of mitral valve stenosis. MV peak gradient, 39.9 mmHg. The mean mitral valve gradient is 24.0 mmHg. Tricuspid Valve: The tricuspid valve is normal in structure. Tricuspid valve regurgitation is not demonstrated. No evidence of tricuspid stenosis. Aortic Valve: The aortic valve was not well visualized. There is mild calcification of the aortic valve. Aortic valve regurgitation is trivial. Aortic regurgitation PHT measures 680 msec. Aortic valve sclerosis is present, with no evidence of aortic valve stenosis. Aortic valve mean gradient measures 5.0 mmHg. Aortic valve peak gradient measures 8.4 mmHg. Aortic valve area, by VTI measures 1.81 cm. Pulmonic Valve: The pulmonic valve was normal in structure. Pulmonic valve regurgitation is not visualized. No evidence of pulmonic stenosis. Aorta: The aortic root is normal in size and structure. Venous: The inferior vena cava is normal in size with greater than 50% respiratory variability, suggesting right atrial pressure of 3 mmHg. IAS/Shunts: No atrial level shunt detected by color flow Doppler.  LEFT VENTRICLE PLAX 2D LVIDd:         3.70 cm     Diastology LVIDs:         2.60 cm     LV e' medial:    7.51 cm/s LV PW:         1.10 cm     LV E/e' medial:  15.7 LV IVS:        0.90 cm     LV e' lateral:   13.40 cm/s LVOT diam:     1.80 cm     LV E/e' lateral: 8.8 LV SV:         52 LV SV Index:   28 LVOT Area:     2.54 cm  LV Volumes (MOD) LV vol d, MOD A2C: 41.9 ml LV vol d, MOD A4C: 42.5 ml LV vol s, MOD A2C: 16.6 ml LV vol s, MOD A4C: 9.4 ml LV SV MOD A2C:     25.3 ml LV SV MOD A4C:  42.5 ml LV  SV MOD BP:      30.2 ml RIGHT VENTRICLE             IVC RV S prime:     11.40 cm/s  IVC diam: 2.30 cm TAPSE (M-mode): 2.3 cm LEFT ATRIUM             Index        RIGHT ATRIUM          Index LA diam:        3.20 cm 1.72 cm/m   RA Area:     8.84 cm LA Vol (A2C):   59.5 ml 31.91 ml/m  RA Volume:   15.80 ml 8.47 ml/m LA Vol (A4C):   49.2 ml 26.38 ml/m LA Biplane Vol: 56.6 ml 30.35 ml/m  AORTIC VALVE                     PULMONIC VALVE AV Area (Vmax):    1.73 cm      PV Vmax:       0.69 m/s AV Area (Vmean):   1.63 cm      PV Vmean:      43.100 cm/s AV Area (VTI):     1.81 cm      PV VTI:        0.124 m AV Vmax:           145.00 cm/s   PV Peak grad:  1.9 mmHg AV Vmean:          102.000 cm/s  PV Mean grad:  1.0 mmHg AV VTI:            0.289 m AV Peak Grad:      8.4 mmHg AV Mean Grad:      5.0 mmHg LVOT Vmax:         98.50 cm/s LVOT Vmean:        65.500 cm/s LVOT VTI:          0.206 m LVOT/AV VTI ratio: 0.71 AI PHT:            680 msec  AORTA Ao Root diam: 2.20 cm Ao Asc diam:  2.40 cm MITRAL VALVE                TRICUSPID VALVE MV Area (PHT): 3.93 cm     TV Peak grad:   48.2 mmHg MV Area VTI:   1.16 cm     TV Mean grad:   28.0 mmHg MV Peak grad:  39.9 mmHg    TV Vmax:        3.47 m/s MV Mean grad:  24.0 mmHg    TV Vmean:       247.0 cm/s MV Vmax:       3.16 m/s     TV VTI:         0.72 msec MV Vmean:      226.0 cm/s   TR Peak grad:   47.1 mmHg MV Decel Time: 193 msec     TR Vmax:        343.00 cm/s MV E velocity: 118.00 cm/s MV A velocity: 109.00 cm/s  SHUNTS MV E/A ratio:  1.08         Systemic VTI:  0.21 m                             Systemic Diam: 1.80 cm Jenkins Rouge MD Electronically signed  by Jenkins Rouge MD Signature Date/Time: 11/10/2021/8:40:51 AM    Final    VAS US CAROTID (at [facility] and [facility] only)  Result Date: 11/10/2021 Carotid Arterial Duplex Study Patient Name:  Connie Campbell  Date of Exam:   11/09/2021 Medical Rec #: [id number]     Accession #:    [id number] Date of Birth: [birth year]     Patient Gender: F  Patient Age:   55 years Exam Location:  [facility] Procedure:      VAS US CAROTID Referring Phys: Karmen Bongo --------------------------------------------------------------------------------  Indications:       CVA. Risk Factors:      Hypertension. Comparison Study:  No prior studies. Performing Technologist: Oliver Hum RVT  Examination Guidelines: A complete evaluation includes B-mode imaging, spectral Doppler, color Doppler, and power Doppler as needed of all accessible portions of each vessel. Bilateral testing is considered an integral part of a complete examination. Limited examinations for reoccurring indications may be performed as noted.  Right Carotid Findings: +----------+--------+--------+--------+-----------------------+--------+             PSV cm/s EDV cm/s Stenosis Plaque Description      Comments  +----------+--------+--------+--------+-----------------------+--------+  CCA Prox   69       10                smooth and heterogenous           +----------+--------+--------+--------+-----------------------+--------+  CCA Distal 50       11                smooth and heterogenous           +----------+--------+--------+--------+-----------------------+--------+  ICA Prox   42       8                 smooth and heterogenous           +----------+--------+--------+--------+-----------------------+--------+  ICA Distal 49       15                                        tortuous  +----------+--------+--------+--------+-----------------------+--------+  ECA        57       0                                                   +----------+--------+--------+--------+-----------------------+--------+ +----------+--------+-------+--------+-------------------+             PSV cm/s EDV cms Describe Arm Pressure (mmHG)  +----------+--------+-------+--------+-------------------+  Subclavian 103                                            +----------+--------+-------+--------+-------------------+  +---------+--------+--+--------+-+---------+  Vertebral PSV cm/s 28 EDV cm/s 6 Antegrade  +---------+--------+--+--------+-+---------+  Left Carotid Findings: +----------+--------+--------+--------+-----------------------+--------+             PSV cm/s EDV cm/s Stenosis Plaque Description      Comments  +----------+--------+--------+--------+-----------------------+--------+  CCA Prox   74       9                 smooth and heterogenous           +----------+--------+--------+--------+-----------------------+--------+  CCA Distal 61       11                smooth and heterogenous           +----------+--------+--------+--------+-----------------------+--------+  ICA Prox   37       7                 smooth and heterogenous           +----------+--------+--------+--------+-----------------------+--------+  ICA Distal 48       13                                        tortuous  +----------+--------+--------+--------+-----------------------+--------+  ECA        54       0                                                   +----------+--------+--------+--------+-----------------------+--------+ +----------+--------+--------+--------+-------------------+             PSV cm/s EDV cm/s Describe Arm Pressure (mmHG)  +----------+--------+--------+--------+-------------------+  Subclavian 136                                             +----------+--------+--------+--------+-------------------+ +---------+--------+--+--------+--+---------+  Vertebral PSV cm/s 54 EDV cm/s 14 Antegrade  +---------+--------+--+--------+--+---------+   Summary: Right Carotid: Velocities in the right ICA are consistent with a 1-39% stenosis. Left Carotid: Velocities in the left ICA are consistent with a 1-39% stenosis. Vertebrals: Bilateral vertebral arteries demonstrate antegrade flow. *See table(s) above for measurements and observations.  Electronically signed by Antony Contras MD on 11/10/2021 at 12:57:15 PM.    Final    VAS [place] LOWER EXTREMITY  VENOUS (DVT)  Result Date: 11/11/2021  Lower Venous DVT Study Patient Name:  Connie Campbell  Date of Exam:   11/10/2021 Medical Rec #: [id number]     Accession #:    [id number] Date of Birth: Oct 04, 1951     Patient Gender: F Patient Age:   27 years Exam Location:  [facility] Procedure:      VAS [place] LOWER EXTREMITY VENOUS (DVT) Referring Phys: [facility] EZENDUKA --------------------------------------------------------------------------------  Indications: Stroke.  Comparison Study: no prior Performing Technologist: Archie Patten RVS  Examination Guidelines: A complete evaluation includes B-mode imaging, spectral Doppler, color Doppler, and power Doppler as needed of all accessible portions of each vessel. Bilateral testing is considered an integral part of a complete examination. Limited examinations for reoccurring indications may be performed as noted. The reflux portion of the exam is performed with the patient in reverse Trendelenburg.  +---------+---------------+---------+-----------+----------+--------------+  RIGHT     Compressibility Phasicity Spontaneity Properties Thrombus Aging  +---------+---------------+---------+-----------+----------+--------------+  CFV       Full            Yes       Yes                                    +---------+---------------+---------+-----------+----------+--------------+  SFJ       Full                                                             +---------+---------------+---------+-----------+----------+--------------+  FV Prox   Full                                                             +---------+---------------+---------+-----------+----------+--------------+  FV Mid    Full                                                             +---------+---------------+---------+-----------+----------+--------------+  FV Distal Full                                                             +---------+---------------+---------+-----------+----------+--------------+   PFV       Full                                                             +---------+---------------+---------+-----------+----------+--------------+  POP       Full            Yes       Yes                                    +---------+---------------+---------+-----------+----------+--------------+  PTV       Full                                                             +---------+---------------+---------+-----------+----------+--------------+  PERO      Full                                                             +---------+---------------+---------+-----------+----------+--------------+   +---------+---------------+---------+-----------+----------+-------------------+  LEFT      Compressibility Phasicity Spontaneity Properties Thrombus Aging       +---------+---------------+---------+-----------+----------+-------------------+  CFV       Full            Yes       Yes                                         +---------+---------------+---------+-----------+----------+-------------------+  SFJ       Full                                                                  +---------+---------------+---------+-----------+----------+-------------------+  FV Prox   Full                                                                  +---------+---------------+---------+-----------+----------+-------------------+  FV Mid    Full                                                                  +---------+---------------+---------+-----------+----------+-------------------+  FV Distal Full                                                                  +---------+---------------+---------+-----------+----------+-------------------+  PFV       Full                                                                  +---------+---------------+---------+-----------+----------+-------------------+  POP       Full            Yes       Yes                                          +---------+---------------+---------+-----------+----------+-------------------+  PTV       Full                                                                  +---------+---------------+---------+-----------+----------+-------------------+  PERO                                                       Not well visualized  +---------+---------------+---------+-----------+----------+-------------------+     Summary: BILATERAL: - No evidence of deep vein thrombosis seen in the lower extremities, bilaterally. -No evidence of popliteal cyst, bilaterally.   *See table(s) above for measurements and observations. Electronically signed by Harold Barban MD on 11/11/2021 at 5:30:04 PM.    Final       Subjective:  Patient seen and examined at bedside this morning.  Hemodynamically stable for discharge today to [facility]  Discharge Exam: Vitals:   11/15/21 0736 11/15/21 1124  BP: 117/73 127/74  Pulse: 73 78  Resp: 16 16  Temp: 98.7 F (37.1 C) 97.7 F (36.5 C)  SpO2: 95% 97%   Vitals:   11/15/21 0035 11/15/21 0325 11/15/21 0736 11/15/21 1124  BP: 114/64 115/63 117/73 127/74  Pulse: 71 72 73 78  Resp: 18 16 16 16   Temp: 98.4 F (36.9 C) 97.6 F (36.4 C) 98.7 F (37.1 C) 97.7 F (36.5 C)  TempSrc:  Oral Oral Oral  SpO2: 94% 96% 95% 97%  Weight:      Height:        General: Pt is alert, awake, not in acute distress Cardiovascular: RRR, S1/S2 +, no rubs, no gallops Respiratory: CTA bilaterally, no wheezing, no rhonchi Abdominal: Soft, NT, ND, bowel sounds + Extremities: no edema, no cyanosis, mild weakness of right lower extremity    The results of significant diagnostics from this hospitalization (including imaging, microbiology, ancillary and laboratory) are listed below for reference.     Microbiology: Recent Results (from the past 240 hour(s))  Resp Panel by RT-PCR (Flu A&B, Covid) Nasopharyngeal Swab     Status: None   Collection Time: 11/09/21  8:00 AM   Specimen: Nasopharyngeal Swab;  Nasopharyngeal(NP) swabs in vial transport medium  Result Value Ref Range Status   SARS Coronavirus 2 by RT PCR NEGATIVE NEGATIVE Final    Comment: (NOTE) SARS-CoV-2 target nucleic acids are NOT DETECTED.  The SARS-CoV-2 RNA is generally detectable in upper respiratory specimens during the acute phase of infection. The lowest concentration of SARS-CoV-2 viral copies this assay can detect is 138 copies/mL. A negative result does not preclude SARS-Cov-2 infection and should not be used as the sole basis for treatment or other patient management decisions. A negative result may occur with  improper specimen collection/handling, submission of specimen other than nasopharyngeal swab, presence of viral mutation(s) within the areas targeted by this assay, and inadequate number of viral copies(<138 copies/mL). A negative result must be combined with clinical observations, patient history, and epidemiological information. The expected result is Negative.  Fact Sheet for Patients:  EntrepreneurPulse.com.au  Fact Sheet for Healthcare Providers:  IncredibleEmployment.be  This test is no t yet approved or cleared by the [place] FDA and  has been authorized for detection and/or diagnosis of SARS-CoV-2 by FDA under an Emergency Use Authorization (EUA). This EUA will remain  in effect (meaning this test can be used) for the duration of the COVID-19 declaration under Section 564(b)(1) of the Act, 21 U.S.C.section 360bbb-3(b)(1), unless the authorization is terminated  or revoked sooner.       Influenza A by PCR NEGATIVE NEGATIVE Final   Influenza B by PCR NEGATIVE NEGATIVE Final    Comment: (NOTE) The Xpert Xpress SARS-CoV-2/FLU/RSV plus assay is intended as an aid in the diagnosis of influenza from Nasopharyngeal swab specimens and should not be used as a sole basis for treatment. Nasal washings and aspirates are unacceptable for Xpert Xpress  SARS-CoV-2/FLU/RSV testing.  Fact Sheet for Patients: EntrepreneurPulse.com.au  Fact Sheet for Healthcare Providers: IncredibleEmployment.be  This test is not yet approved or cleared by the [place] FDA and has been authorized for detection and/or diagnosis of SARS-CoV-2 by FDA under an Emergency Use Authorization (EUA). This EUA will remain in effect (meaning this test can be used) for the duration of the COVID-19 declaration under Section 564(b)(1) of the Act, 21 U.S.C. section 360bbb-3(b)(1), unless the authorization is terminated or revoked.  Performed at [facility], [place] [address] [place], [place] [address]      Labs: BNP (last 3 results) No results for input(s): BNP in the last 8760  hours. Basic Metabolic Panel: Recent Labs  Lab 11/09/21 0800 11/11/21 0239 11/12/21 0038 11/15/21 0220  NA 138 135 136 134*  K 4.3 4.1 4.4 4.2  CL 108 103 101 100  CO2 21* 22 24 24   GLUCOSE 161* 158* 177* 231*  BUN 22 20 21  24*  CREATININE 0.83 0.85 0.85 0.90  CALCIUM 8.5* 9.0 9.1 9.1   Liver Function Tests: Recent Labs  Lab 11/09/21 0800  AST 21  ALT 24  ALKPHOS 82  BILITOT 0.4  PROT 6.6  ALBUMIN 3.2*   No results for input(s): LIPASE, AMYLASE in the last 168 hours. No results for input(s): AMMONIA in the last 168 hours. CBC: Recent Labs  Lab 11/09/21 0800 11/11/21 0239 11/12/21 0038 11/15/21 0220  WBC 8.0 6.6 7.3 7.1  NEUTROABS 5.3 3.8 3.9 4.1  HGB 14.6 13.7 14.1 14.8  HCT 44.6 42.0 43.5 44.8  MCV 88.7 87.3 87.0 86.8  PLT 251 230 252 249   Cardiac Enzymes: No results for input(s): CKTOTAL, CKMB, CKMBINDEX, TROPONINI in the last 168 hours. BNP: Invalid input(s): POCBNP CBG: Recent Labs  Lab 11/14/21 1205 11/14/21 1645 11/14/21 2140 11/15/21 0604 11/15/21 1124  GLUCAP 213* 150* 214* 169* 178*   D-Dimer No results for input(s): DDIMER in the last 72 hours. Hgb A1c No results for input(s): HGBA1C in  the last 72 hours. Lipid Profile No results for input(s): CHOL, HDL, LDLCALC, TRIG, CHOLHDL, LDLDIRECT in the last 72 hours. Thyroid function studies No results for input(s): TSH, T4TOTAL, T3FREE, THYROIDAB in the last 72 hours.  Invalid input(s): FREET3 Anemia work up No results for input(s): VITAMINB12, FOLATE, FERRITIN, TIBC, IRON, RETICCTPCT in the last 72 hours. Urinalysis    Component Value Date/Time   COLORURINE YELLOW 11/09/2021 0801   APPEARANCEUR CLEAR 11/09/2021 0801   LABSPEC 1.025 11/09/2021 0801   PHURINE 5.5 11/09/2021 0801   GLUCOSEU >=500 (A) 11/09/2021 0801   HGBUR NEGATIVE 11/09/2021 0801   BILIRUBINUR NEGATIVE 11/09/2021 0801   KETONESUR NEGATIVE 11/09/2021 0801   PROTEINUR NEGATIVE 11/09/2021 0801   NITRITE NEGATIVE 11/09/2021 0801   LEUKOCYTESUR NEGATIVE 11/09/2021 0801   Sepsis Labs Invalid input(s): PROCALCITONIN,  WBC,  LACTICIDVEN Microbiology Recent Results (from the past 240 hour(s))  Resp Panel by RT-PCR (Flu A&B, Covid) Nasopharyngeal Swab     Status: None   Collection Time: 11/09/21  8:00 AM   Specimen: Nasopharyngeal Swab; Nasopharyngeal(NP) swabs in vial transport medium  Result Value Ref Range Status   SARS Coronavirus 2 by RT PCR NEGATIVE NEGATIVE Final    Comment: (NOTE) SARS-CoV-2 target nucleic acids are NOT DETECTED.  The SARS-CoV-2 RNA is generally detectable in upper respiratory specimens during the acute phase of infection. The lowest concentration of SARS-CoV-2 viral copies this assay can detect is 138 copies/mL. A negative result does not preclude SARS-Cov-2 infection and should not be used as the sole basis for treatment or other patient management decisions. A negative result may occur with  improper specimen collection/handling, submission of specimen other than nasopharyngeal swab, presence of viral mutation(s) within the areas targeted by this assay, and inadequate number of viral copies(<138 copies/mL). A negative result  must be combined with clinical observations, patient history, and epidemiological information. The expected result is Negative.  Fact Sheet for Patients:  EntrepreneurPulse.com.au  Fact Sheet for Healthcare Providers:  IncredibleEmployment.be  This test is no t yet approved or cleared by the [place] FDA and  has been authorized for detection and/or diagnosis of SARS-CoV-2 by FDA under  an Emergency Use Authorization (EUA). This EUA will remain  in effect (meaning this test can be used) for the duration of the COVID-19 declaration under Section 564(b)(1) of the Act, 21 U.S.C.section 360bbb-3(b)(1), unless the authorization is terminated  or revoked sooner.       Influenza A by PCR NEGATIVE NEGATIVE Final   Influenza B by PCR NEGATIVE NEGATIVE Final    Comment: (NOTE) The Xpert Xpress SARS-CoV-2/FLU/RSV plus assay is intended as an aid in the diagnosis of influenza from Nasopharyngeal swab specimens and should not be used as a sole basis for treatment. Nasal washings and aspirates are unacceptable for Xpert Xpress SARS-CoV-2/FLU/RSV testing.  Fact Sheet for Patients: EntrepreneurPulse.com.au  Fact Sheet for Healthcare Providers: IncredibleEmployment.be  This test is not yet approved or cleared by the [place] FDA and has been authorized for detection and/or diagnosis of SARS-CoV-2 by FDA under an Emergency Use Authorization (EUA). This EUA will remain in effect (meaning this test can be used) for the duration of the COVID-19 declaration under Section 564(b)(1) of the Act, 21 U.S.C. section 360bbb-3(b)(1), unless the authorization is terminated or revoked.  Performed at [facility], [place] [address] [place], [place] [address]     Please note: You were cared for by a hospitalist during your hospital stay. Once you are discharged, your primary care physician will handle any further  medical issues. Please note that NO REFILLS for any discharge medications will be authorized once you are discharged, as it is imperative that you return to your primary care physician (or establish a relationship with a primary care physician if you do not have one) for your post hospital discharge needs so that they can reassess your need for medications and monitor your lab values.    Time coordinating discharge: 40 minutes  SIGNED:   Shelly Coss, MD  [facility] 11/15/2021, 11:34 AM Pager [phone]  If 7PM-7AM, please contact night-coverage [email or web address] Password [email or web address]

## 2021-11-15 NOTE — Consult Note (Signed)
ELECTROPHYSIOLOGY CONSULT NOTE  Patient ID: Connie Campbell MRN: [id number], DOB/AGE: January 26, 1951   Admit date: 11/09/2021 Date of Consult: 11/15/2021  Primary Physician: Lilian Coma., MD Primary Cardiologist: None  Primary Electrophysiologist: New to Dr. Quentin Ore  Reason for Consultation: Cryptogenic stroke; recommendations regarding Implantable Loop Recorder Insurance: [employer]  History of Present Illness EP has been asked to evaluate Ethelene Browns for placement of an implantable loop recorder to monitor for atrial fibrillation by Dr Erlinda Hong.  The patient was admitted on 11/09/2021 with right leg weakness.    Imaging demonstrated left small ACA infarct, embolic pattern, source unclear.    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  She has undergone workup for stroke:  MRI  Small acute L frontal lobe infarct in the distribution of ACA; multiple chronic areas of ischemia in cerebrum and cerebellum MRA  No large vessel occlusion Carotid Doppler: Bilateral ICA with 1-39% stenosis 2D Echo: LVEF 60-65%, Trivial MVR and AVR. Aortic valve sclerosis without stenosis.  LE Doppler no evidence of DVT Recommend loop recorder placement to rule out A-fib before discharge LDL 90 HgbA1c 8.0 VTE prophylaxis - SCDs No antithrombotic and previously on aspirin but d/c 2/2 h/o gastritis  prior to admission, now on  enteric coated aspirin 81 mg and plavix 75 mg x 3 weeks, then plavix alone .  Therapy recommendations:  CIR Disposition:  CIR today   The patient has been monitored on telemetry which has demonstrated sinus rhythm with no arrhythmias.  Inpatient stroke work-up will not require a TEE per Neurology.   Echocardiogram as above. Lab work is reviewed.  Prior to admission, the patient denies chest pain, shortness of breath, dizziness, palpitations, or syncope.  She is recovering from her stroke with plans to attend CIR  at  discharge.  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   11/08/2021   insulin detemir (LEVEMIR FLEXTOUCH) 100 UNIT/ML FlexPen Inject 80 Units into the skin at bedtime.   11/08/2021   JARDIANCE 25 MG TABS tablet Take 25 mg by mouth daily.   11/08/2021   levETIRAcetam (KEPPRA) 750 MG tablet Take 750 mg by mouth 2 (two) times daily.   11/08/2021   metFORMIN (GLUCOPHAGE) 850 MG tablet Take 850 mg by mouth 3 (three) times daily.   11/08/2021   Multiple Vitamin (MULTIVITAMIN) capsule Take 1 capsule by mouth daily.   11/08/2021   oxyCODONE-acetaminophen (PERCOCET) 10-325 MG tablet Take 1 tablet by mouth 4 (four) times daily as needed for pain.   11/09/2021 at 0400   OZEMPIC, 0.25 OR 0.5 MG/DOSE, 2 MG/1.5ML SOPN Inject 0.5 mg into the skin once a week. Wednesday   11/08/2021  simethicone (MYLICON) 811 MG chewable tablet Chew 125 mg by mouth every 6 (six) hours as needed for flatulence.   unk   SYNTHROID 100 MCG tablet Take 100 mcg by mouth daily.   11/08/2021   telmisartan (MICARDIS) 40 MG tablet Take 40 mg by mouth daily.   11/08/2021   timolol (TIMOPTIC) 0.5 % ophthalmic solution Place 1 drop into the right eye 2 (two) times daily.   11/08/2021    Inpatient Medications:   aspirin EC  81 mg Oral Daily   atorvastatin  40 mg Oral Daily   clopidogrel  75 mg Oral Daily   enoxaparin (LOVENOX) injection  40 mg  Subcutaneous Daily   insulin aspart  0-15 Units Subcutaneous TID WC   insulin detemir  60 Units Subcutaneous QHS   irbesartan  75 mg Oral Daily   levETIRAcetam  750 mg Oral BID   levothyroxine  100 mcg Oral QAC breakfast   multivitamin with minerals  1 tablet Oral Daily   timolol  1 drop Right Eye BID    Allergies:  Allergies  Allergen Reactions   Aspirin Other (See Comments)    GI upset      Charentais Melon (French Melon) Other (See Comments)   Erythromycin Base Other (See Comments) and Rash   Ginkgo Biloba Other (See Comments) and Rash   Bactrim [Sulfamethoxazole-Trimethoprim] Hives   Beta Vulgaris    Ciprofloxacin    Coconut Oil Other (See Comments)    seizure   Gluten Meal Diarrhea   Keflex [Cephalexin] Swelling   Peanut Butter Flavor Hives   Penicillins Swelling   Tramadol Other (See Comments)    Depression, GI discomfort   Ibuprofen Diarrhea    Other reaction(s): GI intolerance STOMACH PAINS bleeding Other reaction(s): GI intoleran    Social History   Socioeconomic History   Marital status: Married    Spouse name: Not on file   Number of children: Not on file   Years of education: Not on file   Highest education level: Not on file  Occupational History   Occupation: retired  Tobacco Use   Smoking status: Never   Smokeless tobacco: Never   Tobacco comments:    3 month lifetime smoker  Substance and Sexual Activity   Alcohol use: Yes    Comment: rare   Drug use: Never   Sexual activity: Not on file  Other Topics Concern   Not on file  Social History Narrative   Not on file   Social Determinants of Health   Financial Resource Strain: Not on file  Food Insecurity: Not on file  Transportation Needs: Not on file  Physical Activity: Not on file  Stress: Not on file  Social Connections: Not on file  Intimate Partner Violence: Not on file     History reviewed. No pertinent family history.    Review of Systems: All other systems reviewed and  are otherwise negative except as noted above.  Physical Exam: Vitals:   11/15/21 0035 11/15/21 0325 11/15/21 0736 11/15/21 1124  BP: 114/64 115/63 117/73 127/74  Pulse: 71 72 73 78  Resp: 18 16 16 16   Temp: 98.4 F (36.9 C) 97.6 F (36.4 C) 98.7 F (37.1 C) 97.7 F (36.5 C)  TempSrc:  Oral Oral Oral  SpO2: 94% 96% 95% 97%  Weight:      Height:        GEN- The patient is well appearing, alert and oriented x 3 today.   Head- normocephalic, atraumatic Eyes-  Sclera clear, conjunctiva pink Ears- hearing intact Oropharynx- clear Neck- supple Lungs- Clear to ausculation bilaterally, normal work of breathing Heart- Regular rate and rhythm, no murmurs, rubs or gallops  GI- soft, NT, ND, + BS Extremities- no clubbing, cyanosis, or edema MS- no significant deformity or atrophy Skin- no rash or lesion Psych- euthymic mood, full affect Neuro- R sided deficit, chronic, improved back to near baseline   Labs:   Lab Results  Component Value Date   WBC 7.1 11/15/2021   HGB 14.8 11/15/2021   HCT 44.8 11/15/2021   MCV 86.8 11/15/2021   PLT 249 11/15/2021    Recent Labs  Lab 11/09/21 0800 11/11/21 0239 11/15/21 0220  NA 138   < > 134*  K 4.3   < > 4.2  CL 108   < > 100  CO2 21*   < > 24  BUN 22   < > 24*  CREATININE 0.83   < > 0.90  CALCIUM 8.5*   < > 9.1  PROT 6.6  --   --   BILITOT 0.4  --   --   ALKPHOS 82  --   --   ALT 24  --   --   AST 21  --   --   GLUCOSE 161*   < > 231*   < > = values in this interval not displayed.     Radiology/Studies: MR ANGIO HEAD WO CONTRAST  Result Date: 11/09/2021 CLINICAL DATA:  Acute stroke on MRI brain EXAM: MRA HEAD WITHOUT CONTRAST TECHNIQUE: Angiographic images of the Circle of Willis were acquired using MRA technique without intravenous contrast. COMPARISON:  No pertinent prior exam. FINDINGS: Anterior circulation: Intracranial internal carotid arteries are patent. Anterior and middle cerebral arteries are patent. 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 Posterior circulation: Intracranial vertebral arteries, basilar artery, and posterior cerebral arteries are patent. Fetal origin of the right PCA. Asymmetric relatively poor flow related enhancement of the right posterior communicating artery and posterior cerebral artery. IMPRESSION: No proximal intracranial vessel occlusion. Asymmetric poor flow related enhancement of right posterior communicating artery and posterior cerebral artery. May be on a technical basis given lack of acute findings in this distribution on the MRI. Electronically Signed   By: Macy Mis M.D.   On: 11/09/2021 14:29   MR BRAIN WO CONTRAST  Result Date: 11/09/2021 CLINICAL DATA:  Neuro deficit, acute, stroke suspected. Right-sided weakness and dizziness >24 hours; hx of right side weakness from prior CVA. EXAM: MRI HEAD WITHOUT CONTRAST TECHNIQUE: Multiplanar, multiecho pulse sequences of the brain and surrounding structures were obtained without intravenous contrast. COMPARISON:  None. FINDINGS: Brain: There are small acute cortical and subcortical infarcts in the high posterior left frontal lobe. Small chronic cortical infarcts are noted in the parietal lobes and posterior right frontal lobe, and there are also multiple small chronic infarcts involving the right greater than left cerebellar hemispheres, thalami, and basal ganglia. T2 hyperintensities elsewhere in the cerebral white matter bilaterally are nonspecific but compatible with mild chronic small vessel ischemic disease. There is mild cerebral atrophy. No intracranial hemorrhage, mass, midline shift, or extra-axial fluid collection is identified. Vascular: Major intracranial vascular flow voids are preserved. Skull and upper cervical spine: Unremarkable bone marrow signal. Postoperative changes posteriorly in the upper cervical spine. Sinuses/Orbits: Bilateral cataract extraction. Complete opacification of the right maxillary sinus by  complex material. Complete opacification of the right frontal sinus as well. Milder mucosal thickening in the ethmoid and sphenoid  sinuses. Trace right mastoid fluid. Other: None. IMPRESSION: 1. Small acute posterior left frontal lobe infarcts. 2. Chronic ischemia with multiple old cerebral and cerebellar infarcts as above. Electronically Signed   By: Logan Bores M.D.   On: 11/09/2021 09:55   ECHOCARDIOGRAM COMPLETE  Result Date: 11/10/2021    ECHOCARDIOGRAM REPORT   Patient Name:   AVIANNAH CASTORO Date of Exam: 11/10/2021 Medical Rec #:  [id number]    Height:       63.0 in Accession #:    [id number]   Weight:       183.6 lb Date of Birth:  [birth year]    BSA:          1.865 m Patient Age:    71 years     BP:           142/67 mmHg Patient Gender: F            HR:           76 bpm. Exam Location:  Inpatient Procedure: 2D Echo, Cardiac Doppler and Color Doppler Indications:    I63.9 STROKE  History:        Patient has no prior history of Echocardiogram examinations.                 Stroke; Risk Factors:Hypertension and Dyslipidemia.  Sonographer:    Beryle Beams Referring Phys: [place]  1. Left ventricular ejection fraction, by estimation, is 60 to 65%. The left ventricle has normal function. The left ventricle has no regional wall motion abnormalities. Left ventricular diastolic parameters were normal.  2. Right ventricular systolic function is normal. The right ventricular size is normal. There is moderately elevated pulmonary artery systolic pressure.  3. The mitral valve is abnormal. Trivial mitral valve regurgitation. No evidence of mitral stenosis.  4. The aortic valve was not well visualized. There is mild calcification of the aortic valve. Aortic valve regurgitation is trivial. Aortic valve sclerosis is present, with no evidence of aortic valve stenosis.  5. The inferior vena cava is normal in size with greater than 50% respiratory variability, suggesting right atrial pressure of 3 mmHg.  FINDINGS  Left Ventricle: Left ventricular ejection fraction, by estimation, is 60 to 65%. The left ventricle has normal function. The left ventricle has no regional wall motion abnormalities. The left ventricular internal cavity size was normal in size. There is  no left ventricular hypertrophy. Left ventricular diastolic parameters were normal. Right Ventricle: The right ventricular size is normal. No increase in right ventricular wall thickness. Right ventricular systolic function is normal. There is moderately elevated pulmonary artery systolic pressure. The tricuspid regurgitant velocity is 3.43 m/s, and with an assumed right atrial pressure of 3 mmHg, the estimated right ventricular systolic pressure is 19.6 mmHg. Left Atrium: Left atrial size was normal in size. Right Atrium: Right atrial size was normal in size. Pericardium: There is no evidence of pericardial effusion. Mitral Valve: The mitral valve is abnormal. There is mild thickening of the mitral valve leaflet(s). There is mild calcification of the mitral valve leaflet(s). Trivial mitral valve regurgitation. No evidence of mitral valve stenosis. MV peak gradient, 39.9 mmHg. The mean mitral valve gradient is 24.0 mmHg. Tricuspid Valve: The tricuspid valve is normal in structure. Tricuspid valve regurgitation is not demonstrated. No evidence of tricuspid stenosis. Aortic Valve: The aortic valve was not well visualized. There is mild calcification of the aortic valve. Aortic valve regurgitation is trivial. Aortic regurgitation PHT measures 680 msec. Aortic  valve sclerosis is present, with no evidence of aortic valve stenosis. Aortic valve mean gradient measures 5.0 mmHg. Aortic valve peak gradient measures 8.4 mmHg. Aortic valve area, by VTI measures 1.81 cm. Pulmonic Valve: The pulmonic valve was normal in structure. Pulmonic valve regurgitation is not visualized. No evidence of pulmonic stenosis. Aorta: The aortic root is normal in size and structure.  Venous: The inferior vena cava is normal in size with greater than 50% respiratory variability, suggesting right atrial pressure of 3 mmHg. IAS/Shunts: No atrial level shunt detected by color flow Doppler.  LEFT VENTRICLE PLAX 2D LVIDd:         3.70 cm     Diastology LVIDs:         2.60 cm     LV e' medial:    7.51 cm/s LV PW:         1.10 cm     LV E/e' medial:  15.7 LV IVS:        0.90 cm     LV e' lateral:   13.40 cm/s LVOT diam:     1.80 cm     LV E/e' lateral: 8.8 LV SV:         52 LV SV Index:   28 LVOT Area:     2.54 cm  LV Volumes (MOD) LV vol d, MOD A2C: 41.9 ml LV vol d, MOD A4C: 42.5 ml LV vol s, MOD A2C: 16.6 ml LV vol s, MOD A4C: 9.4 ml LV SV MOD A2C:     25.3 ml LV SV MOD A4C:     42.5 ml LV SV MOD BP:      30.2 ml RIGHT VENTRICLE             IVC RV S prime:     11.40 cm/s  IVC diam: 2.30 cm TAPSE (M-mode): 2.3 cm LEFT ATRIUM             Index        RIGHT ATRIUM          Index LA diam:        3.20 cm 1.72 cm/m   RA Area:     8.84 cm LA Vol (A2C):   59.5 ml 31.91 ml/m  RA Volume:   15.80 ml 8.47 ml/m LA Vol (A4C):   49.2 ml 26.38 ml/m LA Biplane Vol: 56.6 ml 30.35 ml/m  AORTIC VALVE                     PULMONIC VALVE AV Area (Vmax):    1.73 cm      PV Vmax:       0.69 m/s AV Area (Vmean):   1.63 cm      PV Vmean:      43.100 cm/s AV Area (VTI):     1.81 cm      PV VTI:        0.124 m AV Vmax:           145.00 cm/s   PV Peak grad:  1.9 mmHg AV Vmean:          102.000 cm/s  PV Mean grad:  1.0 mmHg AV VTI:            0.289 m AV Peak Grad:      8.4 mmHg AV Mean Grad:      5.0 mmHg LVOT Vmax:         98.50 cm/s LVOT Vmean:  65.500 cm/s LVOT VTI:          0.206 m LVOT/AV VTI ratio: 0.71 AI PHT:            680 msec  AORTA Ao Root diam: 2.20 cm Ao Asc diam:  2.40 cm MITRAL VALVE                TRICUSPID VALVE MV Area (PHT): 3.93 cm     TV Peak grad:   48.2 mmHg MV Area VTI:   1.16 cm     TV Mean grad:   28.0 mmHg MV Peak grad:  39.9 mmHg    TV Vmax:        3.47 m/s MV Mean grad:  24.0 mmHg     TV Vmean:       247.0 cm/s MV Vmax:       3.16 m/s     TV VTI:         0.72 msec MV Vmean:      226.0 cm/s   TR Peak grad:   47.1 mmHg MV Decel Time: 193 msec     TR Vmax:        343.00 cm/s MV E velocity: 118.00 cm/s MV A velocity: 109.00 cm/s  SHUNTS MV E/A ratio:  1.08         Systemic VTI:  0.21 m                             Systemic Diam: 1.80 cm Jenkins Rouge MD Electronically signed by Jenkins Rouge MD Signature Date/Time: 11/10/2021/8:40:51 AM    Final    VAS US CAROTID (at [facility] and [facility] only)  Result Date: 11/10/2021 Carotid Arterial Duplex Study Patient Name:  YZABELLA CRUNK  Date of Exam:   11/09/2021 Medical Rec #: [id number]     Accession #:    [id number] Date of Birth: 1951-08-22     Patient Gender: F Patient Age:   74 years Exam Location:  [facility] Procedure:      VAS US CAROTID Referring Phys: Karmen Bongo --------------------------------------------------------------------------------  Indications:       CVA. Risk Factors:      Hypertension. Comparison Study:  No prior studies. Performing Technologist: Oliver Hum RVT  Examination Guidelines: A complete evaluation includes B-mode imaging, spectral Doppler, color Doppler, and power Doppler as needed of all accessible portions of each vessel. Bilateral testing is considered an integral part of a complete examination. Limited examinations for reoccurring indications may be performed as noted.  Right Carotid Findings: +----------+--------+--------+--------+-----------------------+--------+             PSV cm/s EDV cm/s Stenosis Plaque Description      Comments  +----------+--------+--------+--------+-----------------------+--------+  CCA Prox   69       10                smooth and heterogenous           +----------+--------+--------+--------+-----------------------+--------+  CCA Distal 50       11                smooth and heterogenous           +----------+--------+--------+--------+-----------------------+--------+  ICA Prox   42       8                  smooth and heterogenous           +----------+--------+--------+--------+-----------------------+--------+  ICA Distal  49       15                                        tortuous  +----------+--------+--------+--------+-----------------------+--------+  ECA        57       0                                                   +----------+--------+--------+--------+-----------------------+--------+ +----------+--------+-------+--------+-------------------+             PSV cm/s EDV cms Describe Arm Pressure (mmHG)  +----------+--------+-------+--------+-------------------+  Subclavian 103                                            +----------+--------+-------+--------+-------------------+ +---------+--------+--+--------+-+---------+  Vertebral PSV cm/s 28 EDV cm/s 6 Antegrade  +---------+--------+--+--------+-+---------+  Left Carotid Findings: +----------+--------+--------+--------+-----------------------+--------+             PSV cm/s EDV cm/s Stenosis Plaque Description      Comments  +----------+--------+--------+--------+-----------------------+--------+  CCA Prox   74       9                 smooth and heterogenous           +----------+--------+--------+--------+-----------------------+--------+  CCA Distal 61       11                smooth and heterogenous           +----------+--------+--------+--------+-----------------------+--------+  ICA Prox   37       7                 smooth and heterogenous           +----------+--------+--------+--------+-----------------------+--------+  ICA Distal 48       13                                        tortuous  +----------+--------+--------+--------+-----------------------+--------+  ECA        54       0                                                   +----------+--------+--------+--------+-----------------------+--------+ +----------+--------+--------+--------+-------------------+             PSV cm/s EDV cm/s Describe Arm Pressure (mmHG)   +----------+--------+--------+--------+-------------------+  Subclavian 136                                             +----------+--------+--------+--------+-------------------+ +---------+--------+--+--------+--+---------+  Vertebral PSV cm/s 54 EDV cm/s 14 Antegrade  +---------+--------+--+--------+--+---------+   Summary: Right Carotid: Velocities in the right ICA are consistent with a 1-39% stenosis. Left Carotid: Velocities in the left ICA are consistent with a 1-39% stenosis. Vertebrals: Bilateral vertebral arteries demonstrate antegrade flow. *See  table(s) above for measurements and observations.  Electronically signed by Antony Contras MD on 11/10/2021 at 12:57:15 PM.    Final    VAS [place] LOWER EXTREMITY VENOUS (DVT)  Result Date: 11/11/2021  Lower Venous DVT Study Patient Name:  ELMARIE DEVLIN  Date of Exam:   11/10/2021 Medical Rec #: [id number]     Accession #:    [id number] Date of Birth: [birth year]     Patient Gender: F Patient Age:   67 years Exam Location:  [facility] Procedure:      VAS [place] LOWER EXTREMITY VENOUS (DVT) Referring Phys: [facility] EZ[place] --------------------------------------------------------------------------------  Indications: Stroke.  Comparison Study: no prior Performing Technologist: Archie Patten RVS  Examination Guidelines: A complete evaluation includes B-mode imaging, spectral Doppler, color Doppler, and power Doppler as needed of all accessible portions of each vessel. Bilateral testing is considered an integral part of a complete examination. Limited examinations for reoccurring indications may be performed as noted. The reflux portion of the exam is performed with the patient in reverse Trendelenburg.  +---------+---------------+---------+-----------+----------+--------------+  RIGHT     Compressibility Phasicity Spontaneity Properties Thrombus Aging  +---------+---------------+---------+-----------+----------+--------------+  CFV       Full            Yes       Yes                                     +---------+---------------+---------+-----------+----------+--------------+  SFJ       Full                                                             +---------+---------------+---------+-----------+----------+--------------+  FV Prox   Full                                                             +---------+---------------+---------+-----------+----------+--------------+  FV Mid    Full                                                             +---------+---------------+---------+-----------+----------+--------------+  FV Distal Full                                                             +---------+---------------+---------+-----------+----------+--------------+  PFV       Full                                                             +---------+---------------+---------+-----------+----------+--------------+  POP       Full            Yes       Yes                                    +---------+---------------+---------+-----------+----------+--------------+  PTV       Full                                                             +---------+---------------+---------+-----------+----------+--------------+  PERO      Full                                                             +---------+---------------+---------+-----------+----------+--------------+   +---------+---------------+---------+-----------+----------+-------------------+  LEFT      Compressibility Phasicity Spontaneity Properties Thrombus Aging       +---------+---------------+---------+-----------+----------+-------------------+  CFV       Full            Yes       Yes                                         +---------+---------------+---------+-----------+----------+-------------------+  SFJ       Full                                                                  +---------+---------------+---------+-----------+----------+-------------------+  FV Prox   Full                                                                   +---------+---------------+---------+-----------+----------+-------------------+  FV Mid    Full                                                                  +---------+---------------+---------+-----------+----------+-------------------+  FV Distal Full                                                                  +---------+---------------+---------+-----------+----------+-------------------+  PFV       Full                                                                  +---------+---------------+---------+-----------+----------+-------------------+  POP       Full            Yes       Yes                                         +---------+---------------+---------+-----------+----------+-------------------+  PTV       Full                                                                  +---------+---------------+---------+-----------+----------+-------------------+  PERO                                                       Not well visualized  +---------+---------------+---------+-----------+----------+-------------------+     Summary: BILATERAL: - No evidence of deep vein thrombosis seen in the lower extremities, bilaterally. -No evidence of popliteal cyst, bilaterally.   *See table(s) above for measurements and observations. Electronically signed by Harold Barban MD on 11/11/2021 at 5:30:04 PM.    Final     12-lead ECG on arrival shows NSR at 75 bpm  (personally reviewed) No prior EKG's to compare.    Telemetry 70-80s (personally reviewed)  Assessment and Plan:  1. Cryptogenic stroke The patient presents with cryptogenic stroke.  The patient does not have a TEE planned for this AM.  I spoke at length with the patient about monitoring for afib with an implantable loop recorder.  Risks, benefits, and alteratives to implantable loop recorder were discussed with the patient today.   At this time, the patient is very clear in their decision to proceed with implantable loop  recorder.   Wound care was reviewed with the patient (keep incision clean and dry for 3 days).  Wound check scheduled and entered in AVS. Please call with questions.   Shirley Friar, PA-C 11/15/2021 11:28 AM

## 2021-11-15 NOTE — TOC Transition Note (Signed)
Transition of Care [facility]) - CM/SW Discharge Note   Patient Details  Name: Gracey Tolle MRN: [id number] Date of Birth: 04-21-1951  [facility]) CM/SW Contact:  Pollie Friar, RN Phone Number: 11/15/2021, 10:46 AM   Clinical Narrative:    Patient is discharging to CIR today. CM signing off.    Final next level of care: IP Rehab Facility Barriers to Discharge: No Barriers Identified   Patient Goals and CMS Choice Patient states their goals for this hospitalization and ongoing recovery are:: to get back home CMS Medicare.gov Compare Post Acute Care list provided to:: Patient Choice offered to / list presented to : Patient  Discharge Placement                       Discharge Plan and Services     Post Acute Care Choice: Home Health                    HH Arranged: PT, OT, Speech Therapy, Nurse's Aide [place] Agency: [place] Date [facility] Agency Contacted: 11/10/21   Representative spoke with at [place]: [place]) Interventions     Readmission Risk Interventions No flowsheet data found.

## 2021-11-15 NOTE — Progress Notes (Addendum)
PMR Admission Coordinator Pre-Admission Assessment   Patient: Connie Campbell is an 71 y.o., female MRN: [id number] DOB: [birth year] Height: 5' 3"  (160 cm) Weight: 83.3 kg   Insurance Information HMO: yes     PPO:      PCP:      IPA:      80/20:      OTHER:  PRIMARY: [employer]       Policy#: [id number]      Subscriber: PT CM Name: [place] No      Phone#: [phone] x [phone]     Fax#: [phone] Pre-Cert#: [id number]      Employer: n/a Approved 2/8 for 5 days with updates due 2/12. Eff Date: 10/08/2021- still active  Deductible: does not have one  OOP Max: $8,300 ($36.03 met)  CIR: $370/day co-pay with a max co-pay of $2,220/admission (6 days)  SNF: $0/day co-pay for days 1-20, $196/day co-pay for days 21-100, limited to 100 days/cal yr  Outpatient:  80% coverage; 20% co-insurance  Home Health:  100% coverage  DME: 80% coverage; 20% co-insurance  Providers: in network   SECONDARY: Medicaid [place] Access       Policy#: [id number]     Phone#:    Development worker, community:       Phone#:    The Actuary for patients in Inpatient Rehabilitation Facilities with attached Privacy Act [place] Records was provided and verbally reviewed with: Patient   Emergency Contact Information Contact Information       Name Relation Home Work Mobile    HEAFNER,LESLIE Spouse     [phone]           Current Medical History  Patient Admitting Diagnosis: CVA  History of Present Illness: Connie Campbell is a 71 y.o. female with medical history significant of glaucoma, CVA and HTN presenting inability to walk, difficulty getting out of bed and dizziness that started 11/07/21. Pt was unable to lift her R leg and was shuffling. Of note, her first stroke led to R-sided deficits and her second stroke led to speech and cognitive issues. Her speech is unchanged from baseline.  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 She presented to [facility] ED  11/09/21.  MRI showed Small acute L frontal lobe infarct in the distribution of ACA; multiple chronic areas of ischemia in cerebrum and cerebellum. MRA showed   No large vessel occlusion. Carotid Doppler revealed  Bilateral ICA with 1-39% stenosis. 2D Echo: LVEF 60-65%, Trivial MVR and AVR. Aortic valve sclerosis without stenosis. LE Doppler no evidence of DVT. Plan for loop recorder prior to discharge. PT and OT saw Pt. And recommended CIR to assist return to PLOF.  Complete NIHSS TOTAL: 2   Patient's medical record from [facility] has been reviewed by the rehabilitation admission coordinator and physician.   Past Medical History      Past Medical History:  Diagnosis Date   Celiac disease     Class 1 obesity due to excess calories with body mass index (BMI) of 31.0 to 31.9 in adult     Dyslipidemia     Glaucoma     Hypertension     Hypothyroidism (acquired)     Stroke [facility])     Trigeminal neuralgia        Has the patient had major surgery during 100 days prior to admission? Yes   Family History   family history is not on file.   Current Medications   Current Facility-Administered Medications:  acetaminophen (TYLENOL) tablet 650 mg, 650 mg, Oral, Q4H PRN **OR** acetaminophen (TYLENOL) 160 MG/5ML solution 650 mg, 650 mg, Per Tube, Q4H PRN **OR** acetaminophen (TYLENOL) suppository 650 mg, 650 mg, Rectal, Q4H PRN, Karmen Bongo, MD   aspirin EC tablet 81 mg, 81 mg, Oral, Daily, Shafer, Devon, NP, 81 mg at 11/11/21 0948   atorvastatin (LIPITOR) tablet 40 mg, 40 mg, Oral, Daily, Karmen Bongo, MD, 40 mg at 11/11/21 0948   baclofen (LIORESAL) tablet 10 mg, 10 mg, Oral, Daily PRN, Karmen Bongo, MD, 10 mg at 11/11/21 2346   clopidogrel (PLAVIX) tablet 75 mg, 75 mg, Oral, Daily, Shafer, Devon, NP, 75 mg at 11/11/21 0948   enoxaparin (LOVENOX) injection 40 mg, 40 mg, Subcutaneous, Daily, Karmen Bongo, MD, 40 mg at 11/11/21 0948   insulin aspart (novoLOG) injection  0-15 Units, 0-15 Units, Subcutaneous, TID WC, Karmen Bongo, MD, 2 Units at 11/12/21 0848   insulin detemir (LEVEMIR) injection 60 Units, 60 Units, Subcutaneous, QHS, Alma Friendly, MD, 60 Units at 11/11/21 2202   levETIRAcetam (KEPPRA) tablet 750 mg, 750 mg, Oral, BID, Karmen Bongo, MD, 750 mg at 11/11/21 2025   levothyroxine (SYNTHROID) tablet 100 mcg, 100 mcg, Oral, QAC breakfast, Karmen Bongo, MD, 100 mcg at 11/12/21 0545   multivitamin with minerals tablet 1 tablet, 1 tablet, Oral, Daily, Alma Friendly, MD, 1 tablet at 11/11/21 2330   oxyCODONE-acetaminophen (PERCOCET/ROXICET) 5-325 MG per tablet 1 tablet, 1 tablet, Oral, QID PRN, 1 tablet at 11/12/21 0548 **AND** [DISCONTINUED] oxyCODONE (Oxy IR/ROXICODONE) immediate release tablet 5 mg, 5 mg, Oral, QID PRN, Joselyn Glassman A, RPH, 5 mg at 11/09/21 1815   polyvinyl alcohol (LIQUIFILM TEARS) 1.4 % ophthalmic solution 1 drop, 1 drop, Both Eyes, PRN, Horris Latino, Adline Peals, MD   senna-docusate (Senokot-S) tablet 1 tablet, 1 tablet, Oral, QHS PRN, Karmen Bongo, MD, 1 tablet at 11/10/21 0549   timolol (TIMOPTIC) 0.5 % ophthalmic solution 1 drop, 1 drop, Right Eye, BID, Karmen Bongo, MD, 1 drop at 11/11/21 2026   Patients Current Diet:  Diet Order                  Diet heart healthy/carb modified Room service appropriate? Yes; Fluid consistency: Thin  Diet effective ____                         Precautions / Restrictions Precautions Precautions: Fall Restrictions Weight Bearing Restrictions: No    Has the patient had 2 or more falls or a fall with injury in the past year? Yes   Prior Activity Level Limited Community (1-2x/wk): Pt. active in the community PTA   Prior Functional Level Self Care: Did the patient need help bathing, dressing, using the toilet or eating? Independent   Indoor Mobility: Did the patient need assistance with walking from room to room (with or without device)? Independent    Stairs: Did the patient need assistance with internal or external stairs (with or without device)? Independent   Functional Cognition: Did the patient need help planning regular tasks such as shopping or remembering to take medications? Independent   Patient Information Are you of Hispanic, Latino/a,or Spanish origin?: A. No, not of Hispanic, Latino/a, or Spanish origin What is your race?: A. White Do you need or want an interpreter to communicate with a doctor or health care staff?: 0. No   Patient's Response To:  Health Literacy and Transportation Is the patient able to respond to health literacy and transportation  needs?: Yes Health Literacy - How often do you need to have someone help you when you read instructions, pamphlets, or other written material from your doctor or pharmacy?: Never In the past 12 months, has lack of transportation kept you from medical appointments or from getting medications?: Yes In the past 12 months, has lack of transportation kept you from meetings, work, or from getting things needed for daily living?: Yes   [place] / [place] Devices/Equipment: Environmental consultant (specify type), Wheelchair, Radio producer (specify quad or straight) Home Equipment: Conservation officer, nature (2 wheels), [employer] - single point, Grab bars - tub/shower   Prior Device Use: Indicate devices/aids used by the patient prior to current illness, exacerbation or injury? None of the above   Current Functional Level Cognition   Arousal/Alertness: Awake/alert Overall Cognitive Status: Impaired/Different from baseline Orientation Level: Oriented X4 Safety/Judgement: Decreased awareness of safety, Decreased awareness of deficits Attention: Focused Focused Attention: Appears intact Memory: Impaired Memory Impairment: Storage deficit, Decreased recall of new information, Decreased short term memory Decreased Short Term Memory: Verbal basic Awareness: Appears intact Problem Solving:  Appears intact Safety/Judgment: Appears intact    Extremity Assessment (includes Sensation/Coordination)   Upper Extremity Assessment: Generalized weakness, RUE deficits/detail RUE Deficits / Details: weakness and mixed tone; Mild weakness in grip R>L RUE Sensation: decreased light touch RUE Coordination: decreased fine motor, decreased gross motor  Lower Extremity Assessment: RLE deficits/detail RLE Deficits / Details: Decreased strength and mild coordination deficits noted during functional mobility. Grossly     ADLs   Overall ADL's : Needs assistance/impaired Grooming: Wash/dry hands, Oral care, Standing, Min guard Upper Body Bathing: Minimal assistance, Sitting Lower Body Bathing: Moderate assistance, Sit to/from stand Upper Body Dressing : Minimal assistance, Sitting Lower Body Dressing: Min guard, Sit to/from stand Lower Body Dressing Details (indicate cue type and reason): Can do socks but struggles (more with left than right) Toilet Transfer: Min guard, Ambulation, Rolling walker (2 wheels), Comfort height toilet, Grab bars Toileting- Clothing Manipulation and Hygiene: Min guard, Sit to/from stand     Mobility   Overal bed mobility: Needs Assistance Bed Mobility: Supine to Sit Supine to sit: Min guard General bed mobility comments: Increased time and use of rails required. No assist to transition fully to EOB.     Transfers   Overall transfer level: Needs assistance Equipment used: Rolling walker (2 wheels) Transfers: Sit to/from Stand Sit to Stand: Min guard Bed to/from chair/wheelchair/BSC transfer type:: Step pivot Step pivot transfers: Min assist General transfer comment: min assist with sit to stand transfer with VC's for hand placement. Needed min assist to maintain balance upon initial sit to stand transfer. Patient had some impulsivety with transfers and ambulation but was corrected with verbal cues to wait before next activity.     Ambulation / Gait / Stairs /  Wheelchair Mobility   Ambulation/Gait Ambulation/Gait assistance: Herbalist (Feet): 75 Feet Assistive device: Rolling walker (2 wheels) Gait Pattern/deviations: Step-to pattern, Decreased step length - right, Decreased step length - left, Decreased stride length, Decreased dorsiflexion - right, Shuffle, Narrow base of support General Gait Details: Patient required VC's to not drag right foot as much on the ground and was able to correct. She had trouble with a step to pattern and was very slow. Needed intermittent min assist with balance during gait. Gait velocity: decreased Gait velocity interpretation: <1.31 ft/sec, indicative of household ambulator     Posture / Balance Balance Overall balance assessment: Needs assistance Sitting-balance support: No  upper extremity supported, Feet supported Sitting balance-Leahy Scale: Good Standing balance support: No upper extremity supported, During functional activity Standing balance-Leahy Scale: Fair Standing balance comment: BUE support using 2 wheel rolling walker     Special needs/care consideration Skin intact    Previous Home Environment (from acute therapy documentation) Living Arrangements: Spouse/significant other, Children  Lives With: Spouse Available Help at Discharge: Family, Available PRN/intermittently Type of Home: House Home Layout: One level, Other (Comment) (sunken living room with 4 stairs and HR on both sides) Home Access: Ramped entrance Bathroom Shower/Tub: Chiropodist: Standard Bathroom Accessibility: Yes How Accessible: Accessible via walker [place]: No Additional Comments: spouse is disabled and patient is primary CG.  SIL and diaghter stay with them, work during the day at home, but can only provide home management, meals and community mobility.   Discharge Living Setting Plans for Discharge Living Setting: Patient's home Type of Home at Discharge: House Discharge  Home Layout: One level Discharge Home Access: [place] entrance Discharge Bathroom Shower/Tub: [place] unit Discharge Bathroom Toilet: Standard Discharge Bathroom Accessibility: Yes How Accessible: Accessible via walker Does the patient have any problems obtaining your medications?: No   Social/Family/Support Systems Patient Roles: Spouse Contact Information: [phone] Anticipated Caregiver: Terica Yogi Anticipated Caregiver's Contact Information: [phone] Ability/Limitations of Caregiver: Spouse disabled, can provide supervision. Daughter and SIL also able to assist Caregiver Availability: 24/7 Discharge Plan Discussed with Primary Caregiver: Yes Is Caregiver In Agreement with Plan?: Yes Does Caregiver/Family have Issues with Lodging/Transportation while Pt is in Rehab?: No   Goals Patient/Family Goal for Rehab: PT/OT/SLP Supervision Expected length of stay: 7-10 days Pt/Family Agrees to Admission and willing to participate: Yes Program Orientation Provided & Reviewed with Pt/Caregiver Including Roles  & Responsibilities: Yes   Decrease burden of Care through IP rehab admission: Specialzed equipment needs, Decrease number of caregivers, Bowel and bladder program, and Patient/family education   Possible need for SNF placement upon discharge: not anticipated    Patient Condition: I have reviewed medical records from [facility], spoken with CM, and patient, spouse, and daughter. I met with patient at the bedside for inpatient rehabilitation assessment.  Patient will benefit from ongoing PT, OT, and SLP, can actively participate in 3 hours of therapy a day 5 days of the week, and can make measurable gains during the admission.  Patient will also benefit from the coordinated team approach during an Inpatient Acute Rehabilitation admission.  The patient will receive intensive therapy as well as Rehabilitation physician, nursing, social worker, and care management  interventions.  Due to safety, skin/wound care, disease management, medication administration, pain management, and patient education the patient requires 24 hour a day rehabilitation nursing.  The patient is currently min A-min g with mobility and basic ADLs.  Discharge setting and therapy post discharge at home with home health is anticipated.  Patient has agreed to participate in the Acute Inpatient Rehabilitation Program and will admit today.   Preadmission Screen Completed By:  Genella Mech, 11/12/2021 10:05 AM ______________________________________________________________________   Discussed status with Dr. Ranell Patrick  on 11/15/21 at 32 and received approval for admission today.   Admission Coordinator:  Genella Mech, CCC-SLP, time 1030/Date 11/15/21    Assessment/Plan: Diagnosis: CVA Does the need for close, 24 hr/day Medical supervision in concert with the patient's rehab needs make it unreasonable for this patient to be served in a less intensive setting? Yes Co-Morbidities requiring supervision/potential complications: overweight, hypertension, dyslipidemia, hypothyroidism, class 1 obesity Due  to bladder management, bowel management, safety, skin/wound care, disease management, medication administration, pain management, and patient education, does the patient require 24 hr/day rehab nursing? Yes Does the patient require coordinated care of a physician, rehab nurse, PT, OT, and SLP to address physical and functional deficits in the context of the above medical diagnosis(es)? Yes Addressing deficits in the following areas: balance, endurance, locomotion, strength, transferring, bowel/bladder control, bathing, dressing, feeding, grooming, toileting, cognition, and psychosocial support Can the patient actively participate in an intensive therapy program of at least 3 hrs of therapy 5 days a week? Yes The potential for patient to make measurable gains while on inpatient rehab is  excellent Anticipated functional outcomes upon discharge from inpatient rehab: supervision PT, supervision OT, supervision SLP Estimated rehab length of stay to reach the above functional goals is: 5-7 days Anticipated discharge destination: Home 10. Overall Rehab/Functional Prognosis: excellent     MD Signature: Leeroy Cha, MD

## 2021-11-15 NOTE — Progress Notes (Signed)
Inpatient Rehab Admissions Coordinator:   I have a bed for this pt. On CIR today. RN may call report to [phone].  Clemens Catholic, [place], [place] Admissions Coordinator  [phone] ([place]) [phone] (office)

## 2021-11-15 NOTE — Plan of Care (Signed)
°  Problem: Education: Goal: Knowledge of disease or condition will improve Outcome: Adequate for Discharge Goal: Knowledge of secondary prevention will improve (SELECT ALL) Outcome: Adequate for Discharge Goal: Knowledge of patient specific risk factors will improve (INDIVIDUALIZE FOR PATIENT) Outcome: Adequate for Discharge Goal: Individualized Educational Video(s) Outcome: Adequate for Discharge   Problem: Coping: Goal: Will verbalize positive feelings about self Outcome: Adequate for Discharge Goal: Will identify appropriate support needs Outcome: Adequate for Discharge   Problem: Health Behavior/Discharge Planning: Goal: Ability to manage health-related needs will improve Outcome: Adequate for Discharge   Problem: Self-Care: Goal: Ability to participate in self-care as condition permits will improve Outcome: Adequate for Discharge Goal: Verbalization of feelings and concerns over difficulty with self-care will improve Outcome: Adequate for Discharge Goal: Ability to communicate needs accurately will improve Outcome: Adequate for Discharge   Problem: Nutrition: Goal: Risk of aspiration will decrease Outcome: Adequate for Discharge Goal: Dietary intake will improve Outcome: Adequate for Discharge   Problem: Ischemic Stroke/TIA Tissue Perfusion: Goal: Complications of ischemic stroke/TIA will be minimized Outcome: Adequate for Discharge   Problem: Education: Goal: Knowledge of General Education information will improve Description: Including pain rating scale, medication(s)/side effects and non-pharmacologic comfort measures Outcome: Adequate for Discharge   Problem: Health Behavior/Discharge Planning: Goal: Ability to manage health-related needs will improve Outcome: Adequate for Discharge   Problem: Clinical Measurements: Goal: Ability to maintain clinical measurements within normal limits will improve Outcome: Adequate for Discharge Goal: Will remain free from  infection Outcome: Adequate for Discharge Goal: Diagnostic test results will improve Outcome: Adequate for Discharge Goal: Respiratory complications will improve Outcome: Adequate for Discharge Goal: Cardiovascular complication will be avoided Outcome: Adequate for Discharge   Problem: Activity: Goal: Risk for activity intolerance will decrease Outcome: Adequate for Discharge   Problem: Nutrition: Goal: Adequate nutrition will be maintained Outcome: Adequate for Discharge   Problem: Coping: Goal: Level of anxiety will decrease Outcome: Adequate for Discharge   Problem: Elimination: Goal: Will not experience complications related to bowel motility Outcome: Adequate for Discharge Goal: Will not experience complications related to urinary retention Outcome: Adequate for Discharge   Problem: Pain Managment: Goal: General experience of comfort will improve Outcome: Adequate for Discharge   Problem: Safety: Goal: Ability to remain free from injury will improve Outcome: Adequate for Discharge   Problem: Skin Integrity: Goal: Risk for impaired skin integrity will decrease Outcome: Adequate for Discharge

## 2021-11-15 NOTE — Progress Notes (Signed)
Patient arrived at approximately 1700 from having a loop recorder placed. Patient is A&O x 4 and able to make her needs known. Denies pain or discomfort at this time. Patient was not uploaded into Rehab system until 1750. Will endorse admission assessment to evening Nurse.

## 2021-11-15 NOTE — Discharge Instructions (Signed)

## 2021-11-15 NOTE — H&P (Signed)
Physical Medicine and Rehabilitation Admission H&P    Chief Complaint  Patient presents with   Functional deficits due to stroke.     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   MRI brain done revealing small acute right frontal infarcts with chronic multiple cerebral and cerebellar strokes. MRA brain negative for LVO. Carotid dopplers were negative for ICA stenosis. 2 D echo showed EF 60-65% with aortic sclerosis without stenosis. She reported not taking ASA due to issues with gastritis.   Dr. Leonie Man felt that L-ACA stroke likely due to embolization from DVT v/s cardiogenic source. BLE dopplers were negative for DVT and she underwent TEE w/loop recorder placement today. Patient with resultant RLE weakness with dizziness affecting balance and mobility. CIR recommended due to functional decline. She feels that her statin is contributing to myalgias.     Review of Systems  Constitutional:  Negative for chills and fever.  HENT:  Positive for hearing loss. Negative for tinnitus.   Eyes:  Negative for blurred vision and discharge.  Respiratory:  Negative for cough and shortness of breath.   Cardiovascular:  Negative for chest pain and palpitations.  Gastrointestinal:  Positive for abdominal pain (still comes and goes). Negative for constipation, heartburn and nausea.  Genitourinary:  Negative for dysuria and urgency.  Musculoskeletal:  Positive for myalgias and neck pain.  Skin:  Negative for itching and rash.  Neurological:  Positive for dizziness, sensory change, speech change, focal weakness and headaches.  Psychiatric/Behavioral:  Negative for memory loss. The  patient has insomnia (chronic get up for a couple of hours every night.).     Past Medical History:  Diagnosis Date   Bilateral neck pain    with tension headaches at times.   Celiac disease    Chronic pain    BUE/BLE and constant.   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 Disabled since '93 due to neck/dystonia/dyslexia. Daughter and son-in law moved in last fall and help with housework. She reports that she has never smoked. She has never used smokeless tobacco. She reports alcohol use--1-2 x per year. She reports that she does not use drugs.   Allergies  Allergen Reactions   Aspirin Other (See Comments)    GI upset      Charentais Melon (French Melon) Other (See Comments)   Erythromycin Base Other (See Comments) and Rash   Ginkgo Biloba  Other (See Comments) and Rash   Bactrim [Sulfamethoxazole-Trimethoprim] Hives   Beta Vulgaris    Ciprofloxacin    Coconut Oil Other (See Comments)    seizure   Gluten Meal Diarrhea   Keflex [Cephalexin] Swelling   Peanut Butter Flavor Hives   Penicillins Swelling   Tramadol Other (See Comments)    Depression, GI discomfort   Ibuprofen Diarrhea    Other reaction(s): GI intolerance STOMACH PAINS bleeding Other reaction(s): GI intoleran    Medications Prior to Admission  Medication Sig Dispense Refill   [START ON 11/16/2021] aspirin EC  81 MG EC tablet Take 1 tablet (81 mg total) by mouth daily for 15 days. Swallow whole. 15 tablet 0   [START ON 11/16/2021] atorvastatin (LIPITOR) 40 MG tablet Take 1 tablet (40 mg total) by mouth daily.     baclofen (LIORESAL) 10 MG tablet Take 10 mg by mouth daily as needed for muscle spasms.     [START ON 11/16/2021] clopidogrel (PLAVIX) 75 MG tablet Take 1 tablet (75 mg total) by mouth daily.     insulin detemir (LEVEMIR FLEXTOUCH) 100 UNIT/ML FlexPen Inject 80 Units into the skin at bedtime.     JARDIANCE 25 MG TABS tablet Take 25 mg by mouth daily.     levETIRAcetam (KEPPRA) 750 MG tablet Take 750 mg by mouth 2 (two) times daily.     metFORMIN (GLUCOPHAGE) 850 MG tablet Take 850 mg by mouth 3 (three) times daily.     Multiple Vitamin (MULTIVITAMIN) capsule Take 1 capsule by mouth daily.     oxyCODONE-acetaminophen (PERCOCET) 10-325 MG tablet Take 1 tablet by mouth 4 (four) times daily as needed for pain.     OZEMPIC, 0.25 OR 0.5 MG/DOSE, 2 MG/1.5ML SOPN Inject 0.5 mg into the skin once a week. Wednesday     simethicone (MYLICON) 623 MG chewable tablet Chew 125 mg by mouth every 6 (six) hours as needed for flatulence.     SYNTHROID 100 MCG tablet Take 100 mcg by mouth daily.     telmisartan (MICARDIS) 40 MG tablet Take 40 mg by mouth daily.     timolol (TIMOPTIC) 0.5 % ophthalmic solution Place 1 drop into the right eye 2 (two) times daily.     Home: Home Living Family/patient expects to be discharged to:: Private residence Living Arrangements: Spouse/significant other, Children Available Help at Discharge: Family, Available PRN/intermittently Type of Home: House Home Access: [place]: One level, Other (Comment) (sunken living room with 4 stairs and HR on both sides) Bathroom Shower/Tub: Chiropodist: Standard Bathroom Accessibility: Yes Home Equipment: Conservation officer, nature (2 wheels), Cane - single point, Grab bars - tub/shower Additional Comments: spouse  is disabled and patient is primary CG.  SIL and diaghter stay with them, work during the day at home, but can only provide home management, meals and community mobility.  Lives With: Spouse   Functional History: Prior Function Prior Level of Function : Independent/Modified Independent Mobility Comments: Walks with a SPC ADLs Comments: No assist per patient for ADL   Functional Status:  Mobility: Bed Mobility Overal bed mobility: Needs Assistance Bed Mobility: Supine to Sit Supine to sit: Supervision General bed mobility comments: verbal cues to initiate Transfers Overall transfer level: Needs assistance Equipment used: Rolling walker (2 wheels) Transfers: Sit to/from Stand Sit to Stand: Min guard Bed to/from chair/wheelchair/BSC transfer type:: Step pivot Step pivot transfers: Min guard General transfer comment: verbal cues to reach back before sitting Ambulation/Gait Ambulation/Gait assistance: Min  guard, Min assist Gait Distance (Feet): 150 Feet Assistive device: None Gait Pattern/deviations: Decreased step length - right, Decreased step length - left, Decreased stride length, Decreased dorsiflexion - right, Shuffle, Step-through pattern, Wide base of support General Gait Details: Slow, pt. holds hands out to help balance, requires multiple standing breaks for aching hip or slight dizziness. Was able to walk bakwards with further decrease in stride length as well as ambulate with focus head turns and pivot turns. Gait velocity: decreased Gait velocity interpretation: <1.31 ft/sec, indicative of household ambulator   ADL: ADL Overall ADL's : Needs assistance/impaired Grooming: Wash/dry hands, Wash/dry face, Oral care, Applying deodorant, Min guard, Standing Upper Body Bathing: Min guard, Standing Lower Body Bathing: Minimal assistance, Sit to/from stand Upper Body Dressing : Minimal assistance, Sitting Upper Body Dressing Details (indicate cue type and reason): down gown from  home Lower Body Dressing: Min guard, Sit to/from stand Lower Body Dressing Details (indicate cue type and reason): changed socks Toilet Transfer: Min guard, Ambulation, Rolling walker (2 wheels) Toilet Transfer Details (indicate cue type and reason): simulated in room Toileting- Clothing Manipulation and Hygiene: Min guard, Sit to/from stand Functional mobility during ADLs: Min guard, Rolling walker (2 wheels) General ADL Comments: performed self care standing and bathed feet seated with no LOB   Cognition: Cognition Overall Cognitive Status: Impaired/Different from baseline Arousal/Alertness: Awake/alert Orientation Level: Oriented X4 Year: 2023 Day of Week: Incorrect Attention: Focused Focused Attention: Appears intact Memory: Impaired Memory Impairment: Storage deficit, Decreased recall of new information, Decreased short term memory Decreased Short Term Memory: Verbal basic Awareness: Appears intact Problem Solving: Appears intact Safety/Judgment: Appears intact Cognition Arousal/Alertness: Awake/alert Behavior During Therapy: WFL for tasks assessed/performed Overall Cognitive Status: Impaired/Different from baseline Area of Impairment: Safety/judgement, Problem solving Safety/Judgement: Decreased awareness of safety Problem Solving: Requires verbal cues General Comments: demonstrated good safety with walker use   There were no vitals taken for this visit. Physical Exam Vitals and nursing note reviewed.  Constitutional:      Appearance: Normal appearance.     Comments: WNWD female. NAD  HEENT: missing teeth, [place] Cardiovascular:     Rate and Rhythm: Normal rate.     Comments: Dry dressing on left chest. Abdomen: NT, ND Neurological:     Mental Status: She is alert and oriented to person, place, and time.     Comments: Decreased hearing. Right facial weakness without mild dysarthria. Right sided weakness with sensory deficits. Able to follow simple 1 and 2 step simple  motor commands.   Results for orders placed or performed during the hospital encounter of 11/09/21 (from the past 48 hour(s))  Glucose, capillary     Status: Abnormal   Collection Time: 11/13/21  8:54 PM  Result Value Ref Range   Glucose-Capillary 254 (H) 70 - 99 mg/dL    Comment: Glucose reference range applies only to samples taken after fasting for at least 8 hours.  Glucose, capillary     Status: Abnormal   Collection Time: 11/14/21  6:27 AM  Result Value Ref Range   Glucose-Capillary 157 (H) 70 - 99 mg/dL    Comment: Glucose reference range applies only to samples taken after fasting for at least 8 hours.  Glucose, capillary     Status: Abnormal   Collection Time: 11/14/21  8:08 AM  Result Value Ref Range   Glucose-Capillary 144 (H) 70 - 99 mg/dL    Comment: Glucose reference range applies only to samples taken after fasting for at least 8 hours.  Glucose, capillary     Status: Abnormal   Collection Time: 11/14/21 12:05 PM  Result Value Ref Range   Glucose-Capillary 213 (H) 70 - 99 mg/dL    Comment: Glucose reference range applies only to samples taken after fasting for at least 8 hours.  Glucose, capillary     Status: Abnormal   Collection Time: 11/14/21  4:45 PM  Result Value Ref Range   Glucose-Capillary 150 (H) 70 - 99 mg/dL    Comment: Glucose reference range applies only to samples taken after fasting for at least 8 hours.  Glucose, capillary     Status: Abnormal   Collection Time: 11/14/21  9:40 PM  Result Value Ref Range   Glucose-Capillary 214 (H) 70 - 99 mg/dL    Comment: Glucose reference range applies only to samples taken after fasting for at least 8 hours.   Comment 1 Notify RN    Comment 2 Document in Chart   CBC with Differential/Platelet     Status: Abnormal   Collection Time: 11/15/21  2:20 AM  Result Value Ref Range   WBC 7.1 4.0 - 10.5 K/uL   RBC 5.16 (H) 3.87 - 5.11 MIL/uL   Hemoglobin 14.8 12.0 - 15.0 g/dL   HCT 44.8 36.0 - 46.0 %   MCV 86.8 80.0 -  100.0 fL   MCH 28.7 26.0 - 34.0 pg   MCHC 33.0 30.0 - 36.0 g/dL   RDW 13.6 11.5 - 15.5 %   Platelets 249 150 - 400 K/uL   nRBC 0.0 0.0 - 0.2 %   Neutrophils Relative % 59 %   Neutro Abs 4.1 1.7 - 7.7 K/uL   Lymphocytes Relative 23 %   Lymphs Abs 1.7 0.7 - 4.0 K/uL   Monocytes Relative 10 %   Monocytes Absolute 0.7 0.1 - 1.0 K/uL   Eosinophils Relative 7 %   Eosinophils Absolute 0.5 0.0 - 0.5 K/uL   Basophils Relative 1 %   Basophils Absolute 0.1 0.0 - 0.1 K/uL   Immature Granulocytes 0 %   Abs Immature Granulocytes 0.03 0.00 - 0.07 K/uL    Comment: Performed at [facility] [address], [place], [place] [address]  Basic metabolic panel     Status: Abnormal   Collection Time: 11/15/21  2:20 AM  Result Value Ref Range   Sodium 134 (L) 135 - 145 mmol/L   Potassium 4.2 3.5 - 5.1 mmol/L   Chloride 100 98 - 111 mmol/L   CO2 24 22 - 32 mmol/L   Glucose, Bld 231 (H) 70 - 99 mg/dL    Comment: Glucose reference range applies only to samples taken after fasting for at least 8 hours.   BUN 24 (H) 8 - 23 mg/dL   Creatinine, Ser 0.90 0.44 - 1.00 mg/dL   Calcium 9.1 8.9 - 10.3 mg/dL   GFR, Estimated >60 >60 mL/min    Comment: (NOTE) Calculated using the CKD-EPI Creatinine Equation (2021)    Anion gap 10 5 - 15    Comment: Performed at [facility] [address] [place], [place] [address]  Glucose, capillary     Status: Abnormal   Collection Time: 11/15/21  6:04 AM  Result Value Ref Range   Glucose-Capillary 169 (H) 70 - 99 mg/dL    Comment: Glucose reference range applies only to samples taken after fasting for at least 8 hours.  Glucose, capillary     Status: Abnormal   Collection Time: 11/15/21 11:24 AM  Result Value Ref Range   Glucose-Capillary 178 (H) 70 - 99 mg/dL    Comment: Glucose reference range applies only to samples taken after fasting for at least 8 hours.   No results found.    There were no vitals taken for this visit.  Medical Problem List  and Plan: 1. Functional deficits secondary to frontal lobe CVA  -patient may not shower for 3 days. Keep dressing on chest clean and dry.   -ELOS/Goals: 5-7 days modI  -Admit to CIR 2.  Antithrombotics: -DVT/anticoagulation:  Pharmaceutical: Lovenox  -antiplatelet therapy: DAPT X 3 weeks followed by plavix alone.  3. Chronic pain with dystonia/Pain Management: Continue percocet prn --has insomnia and sleeps in recline. Gets up once a night for pain pill.    4. Insomnia: add magnesium 249m HS.  5. Neuropsych: This patient is capable of making decisions on her own behalf. 6. Skin/Wound Care: Routine pressure relief measures.  7. Fluids/Electrolytes/Nutrition: Monitor I/O. Check CMET in am.  8. HTN: Monitor BP TID- SBP goal <140  --continue Avapro (for micardis) 9.T2DM: Hgb A1C-8.0. Uncontrolled on Jardiance, Levemir 80U, metformin 850 mg BID, Ozempic weekly on Wed PTA.--->patient will have husband bring in ozempic to resume regimen.  --Continue levemir 60 units. Resume Metformin at 500 mg TID as BS poorly controlled.  --monitor BS ac/hs. Use SSI for elevated BS and titrate insulin and metformin to home dose.   10. Seizure d/o: Stable and controlled on Keppra 750 mg bid  11. Dyslipidemia: will provide list of foods and supplements that can help lower LDL. See #13.  12. Hypothyroid: Managed on synthroid for supplement. 13. 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   I have personally performed a face to face diagnostic evaluation, including, but not limited to relevant history and physical exam findings, of this patient and developed relevant assessment and plan.  Additionally, I have reviewed and concur with the physician assistant's documentation above.   KIzora Ribas MD 11/15/2021

## 2021-11-15 NOTE — Progress Notes (Signed)
Occupational Therapy Treatment Patient Details Name: Connie Campbell MRN: [id number] DOB: Dec 07, 1950 Today's Date: 11/15/2021   History of present illness Pt is a 71 y/o. female who presents with dizziness and difficulty moving her right arm and right leg more than baseline. MRI revealed small acute posterior left frontal lobe infarcts. PMH significant for CVA with residual right-sided deficits, trigeminal neuralgia, hypertension, diabetes.   OT comments  Patient seen by skilled OT to address bathing and dressing. Patient performed bathing at sink level sitting for bathing feet only.  Patient was able to perform LB dressing with mn guard while standing. Patient would benefit from increased intensity rehab in [facility]. Acute OT to continue to follow.    Recommendations for follow up therapy are one component of a multi-disciplinary discharge planning process, led by the attending physician.  Recommendations may be updated based on patient status, additional functional criteria and insurance authorization.    Follow Up Recommendations  Acute inpatient rehab (3hours/day)    Assistance Recommended at Discharge None (if goes to [facility])  Patient can return home with the following  A little help with walking and/or transfers;A little help with bathing/dressing/bathroom;Help with stairs or ramp for entrance;Assist for transportation;Assistance with cooking/housework   Equipment Recommendations  BSC/3in1;Tub/shower bench    Recommendations for Other Services      Precautions / Restrictions Precautions Precautions: Fall Restrictions Weight Bearing Restrictions: No       Mobility Bed Mobility Overal bed mobility: Needs Assistance Bed Mobility: Supine to Sit     Supine to sit: Supervision     General bed mobility comments: verbal cues to initiate    Transfers Overall transfer level: Needs assistance Equipment used: Rolling walker (2 wheels) Transfers: Sit to/from Stand Sit to Stand: Min  guard     Step pivot transfers: Min guard     General transfer comment: verbal cues to reach back before sitting     Balance Overall balance assessment: Needs assistance Sitting-balance support: No upper extremity supported, Feet supported Sitting balance-Leahy Scale: Good     Standing balance support: During functional activity, No upper extremity supported Standing balance-Leahy Scale: Fair Standing balance comment: able to stand at sink for grooming tasks with no LOB                           ADL either performed or assessed with clinical judgement   ADL Overall ADL's : Needs assistance/impaired     Grooming: Wash/dry hands;Wash/dry face;Oral care;Applying deodorant;Min guard;Standing   Upper Body Bathing: Min guard;Standing   Lower Body Bathing: Minimal assistance;Sit to/from stand   Upper Body Dressing : Minimal assistance;Sitting Upper Body Dressing Details (indicate cue type and reason): down gown from home Lower Body Dressing: Min guard;Sit to/from stand Lower Body Dressing Details (indicate cue type and reason): changed socks             Functional mobility during ADLs: Min guard;Rolling walker (2 wheels) General ADL Comments: performed self care standing and bathed feet seated with no LOB    Extremity/Trunk Assessment Upper Extremity Assessment RUE Deficits / Details: weakness and mixed tone; Mild weakness in grip R>L RUE Sensation: decreased light touch RUE Coordination: decreased fine motor;decreased gross motor            Vision       Perception     Praxis      Cognition Arousal/Alertness: Awake/alert Behavior During Therapy: WFL for tasks assessed/performed Overall Cognitive Status: Impaired/Different from baseline  Area of Impairment: Safety/judgement, Problem solving                         Safety/Judgement: Decreased awareness of safety   Problem Solving: Requires verbal cues General Comments: demonstrated  good safety with walker use        Exercises      Shoulder Instructions       General Comments      Pertinent Vitals/ Pain       Pain Assessment Pain Assessment: No/denies pain  Home Living                                          Prior Functioning/Environment              Frequency  Min 2X/week        Progress Toward Goals  OT Goals(current goals can now be found in the care plan section)  Progress towards OT goals: Progressing toward goals  Acute Rehab OT Goals Patient Stated Goal: go to rehab OT Goal Formulation: With patient Time For Goal Achievement: 11/23/21 ADL Goals Pt Will Perform Grooming: with modified independence;standing Pt Will Perform Lower Body Bathing: with modified independence;sit to/from stand Pt Will Perform Lower Body Dressing: with modified independence;sit to/from stand Pt Will Transfer to Toilet: with modified independence;ambulating Pt Will Perform Toileting - Clothing Manipulation and hygiene: with modified independence;sit to/from stand  Plan Discharge plan remains appropriate;Frequency remains appropriate    Co-evaluation                 AM-PAC OT "6 Clicks" Daily Activity     Outcome Measure   Help from another person eating meals?: None Help from another person taking care of personal grooming?: A Little Help from another person toileting, which includes using toliet, bedpan, or urinal?: A Little Help from another person bathing (including washing, rinsing, drying)?: A Little Help from another person to put on and taking off regular upper body clothing?: A Little Help from another person to put on and taking off regular lower body clothing?: A Little 6 Click Score: 19    End of Session Equipment Utilized During Treatment: Rolling walker (2 wheels)  OT Visit Diagnosis: Unsteadiness on feet (R26.81);Muscle weakness (generalized) (M62.81) Hemiplegia - Right/Left: Right Hemiplegia -  dominant/non-dominant: Dominant Hemiplegia - caused by: Cerebral infarction   Activity Tolerance Patient tolerated treatment well   Patient Left in chair;with call bell/phone within reach;with chair alarm set   Nurse Communication Mobility status        Time: 8657-8469 OT Time Calculation (min): 23 min  Charges: OT General Charges $OT Visit: 1 Visit OT Treatments $Self Care/Home Management : 23-37 mins  Lodema Hong, [place]  Pager [phone] Office [place] 11/15/2021, 9:01 AM

## 2021-11-16 ENCOUNTER — Encounter (HOSPITAL_COMMUNITY): Payer: Self-pay | Admitting: Cardiology

## 2021-11-16 ENCOUNTER — Other Ambulatory Visit: Payer: Self-pay

## 2021-11-16 LAB — COMPREHENSIVE METABOLIC PANEL
ALT: 36 U/L (ref 0–44)
AST: 26 U/L (ref 15–41)
Albumin: 3.2 g/dL — ABNORMAL LOW (ref 3.5–5.0)
Alkaline Phosphatase: 87 U/L (ref 38–126)
Anion gap: 10 (ref 5–15)
BUN: 22 mg/dL (ref 8–23)
CO2: 24 mmol/L (ref 22–32)
Calcium: 9.1 mg/dL (ref 8.9–10.3)
Chloride: 101 mmol/L (ref 98–111)
Creatinine, Ser: 0.76 mg/dL (ref 0.44–1.00)
GFR, Estimated: >60 mL/min (ref >60)
Glucose, Bld: 132 mg/dL — ABNORMAL HIGH (ref 70–99)
Potassium: 4.1 mmol/L (ref 3.5–5.1)
Sodium: 135 mmol/L (ref 135–145)
Total Bilirubin: 0.4 mg/dL (ref 0.3–1.2)
Total Protein: 6.7 g/dL (ref 6.5–8.1)

## 2021-11-16 LAB — CBC WITH DIFFERENTIAL/PLATELET
Abs Immature Granulocytes: 0.04 10*3/uL (ref 0.00–0.07)
Basophils Absolute: 0.1 10*3/uL (ref 0.0–0.1)
Basophils Relative: 1 %
Eosinophils Absolute: 0.5 10*3/uL (ref 0.0–0.5)
Eosinophils Relative: 7 %
HCT: 41.9 % (ref 36.0–46.0)
Hemoglobin: 14.3 g/dL (ref 12.0–15.0)
Immature Granulocytes: 1 %
Lymphocytes Relative: 22 %
Lymphs Abs: 1.7 10*3/uL (ref 0.7–4.0)
MCH: 29.4 pg (ref 26.0–34.0)
MCHC: 34.1 g/dL (ref 30.0–36.0)
MCV: 86.2 fL (ref 80.0–100.0)
Monocytes Absolute: 0.8 10*3/uL (ref 0.1–1.0)
Monocytes Relative: 11 %
Neutro Abs: 4.4 10*3/uL (ref 1.7–7.7)
Neutrophils Relative %: 58 %
Platelets: 233 10*3/uL (ref 150–400)
RBC: 4.86 MIL/uL (ref 3.87–5.11)
RDW: 13.6 % (ref 11.5–15.5)
WBC: 7.4 10*3/uL (ref 4.0–10.5)
nRBC: 0 % (ref 0.0–0.2)

## 2021-11-16 LAB — GLUCOSE, CAPILLARY
Glucose-Capillary: 155 mg/dL — ABNORMAL HIGH (ref 70–99)
Glucose-Capillary: 104 mg/dL — ABNORMAL HIGH (ref 70–99)
Glucose-Capillary: 189 mg/dL — ABNORMAL HIGH (ref 70–99)
Glucose-Capillary: 225 mg/dL — ABNORMAL HIGH (ref 70–99)

## 2021-11-16 MED ORDER — MAGNESIUM GLUCONATE 500 MG PO TABS
500.0000 mg | ORAL_TABLET | Freq: Every day | ORAL | Status: DC
  Administered 2021-11-16 – 2021-11-20 (×5): 500 mg via ORAL
  Filled 2021-11-16 (×6): qty 1

## 2021-11-16 MED ORDER — BACLOFEN 10 MG PO TABS
10.0000 mg | ORAL_TABLET | Freq: Every day | ORAL | Status: DC | PRN
  Administered 2021-11-19: 10 mg via ORAL
  Filled 2021-11-16: qty 1

## 2021-11-16 MED ORDER — METFORMIN HCL 500 MG PO TABS
1000.0000 mg | ORAL_TABLET | Freq: Two times a day (BID) | ORAL | Status: DC
  Administered 2021-11-16 – 2021-11-21 (×10): 1000 mg via ORAL
  Filled 2021-11-16 (×10): qty 2

## 2021-11-16 NOTE — Plan of Care (Signed)
°  Problem: RH Grooming Goal: LTG Patient will perform grooming w/assist,cues/equip (OT) Description: LTG: Patient will perform grooming with assist, with/without cues using equipment (OT) Flowsheets (Taken 11/16/2021 1232) LTG: Pt will perform grooming with assistance level of: Independent with assistive device    Problem: RH Bathing Goal: LTG Patient will bathe all body parts with assist levels (OT) Description: LTG: Patient will bathe all body parts with assist levels (OT) Flowsheets (Taken 11/16/2021 1232) LTG: Pt will perform bathing with assistance level/cueing: Set up assist  LTG: Position pt will perform bathing: Shower   Problem: RH Dressing Goal: LTG Patient will perform upper body dressing (OT) Description: LTG Patient will perform upper body dressing with assist, with/without cues (OT). Flowsheets (Taken 11/16/2021 1232) LTG: Pt will perform upper body dressing with assistance level of: Independent with assistive device Goal: LTG Patient will perform lower body dressing w/assist (OT) Description: LTG: Patient will perform lower body dressing with assist, with/without cues in positioning using equipment (OT) Flowsheets (Taken 11/16/2021 1232) LTG: Pt will perform lower body dressing with assistance level of: Independent with assistive device   Problem: RH Toileting Goal: LTG Patient will perform toileting task (3/3 steps) with assistance level (OT) Description: LTG: Patient will perform toileting task (3/3 steps) with assistance level (OT)  Flowsheets (Taken 11/16/2021 1232) LTG: Pt will perform toileting task (3/3 steps) with assistance level: Independent with assistive device   Problem: RH Toilet Transfers Goal: LTG Patient will perform toilet transfers w/assist (OT) Description: LTG: Patient will perform toilet transfers with assist, with/without cues using equipment (OT) Flowsheets (Taken 11/16/2021 1232) LTG: Pt will perform toilet transfers with assistance level of: Independent  with assistive device   Problem: RH Tub/Shower Transfers Goal: LTG Patient will perform tub/shower transfers w/assist (OT) Description: LTG: Patient will perform tub/shower transfers with assist, with/without cues using equipment (OT) Flowsheets (Taken 11/16/2021 1232) LTG: Pt will perform tub/shower stall transfers with assistance level of: Supervision/Verbal cueing LTG: Pt will perform tub/shower transfers from: Tub/shower combination

## 2021-11-16 NOTE — Evaluation (Signed)
Physical Therapy Assessment and Plan  Patient Details  Name: Connie Campbell MRN: [id number] Date of Birth: [birth year]  PT Diagnosis: Abnormal posture, Abnormality of gait, Difficulty walking, Hemiparesis non-dominant, Impaired sensation, Low back pain, and Muscle weakness Rehab Potential: Good ELOS: 5-7 days   Today's Date: 11/16/2021 PT Individual Time: 1100-1157 PT Individual Time Calculation (min): 57 min    Hospital Problem: Principal Problem:   Ischemic stroke of frontal lobe ([place])   Past Medical History:  Past Medical History:  Diagnosis Date   Bilateral neck pain    with tension headaches at times.   Celiac disease    Chronic pain    BUE/BLE and constant.   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, residual speech deficit and dysphagia   CHOLECYSTECTOMY     LOOP RECORDER INSERTION N/A 11/15/2021   Procedure: LOOP RECORDER INSERTION;  Surgeon: Vickie Epley, MD;  Location: [place] [facility];  Service: Cardiovascular;  Laterality: N/A;    Assessment & Plan Clinical Impression: Patient iis a 71 year old RH female with history of glaucoma, W2HE, 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   MRI brain done revealing small acute right frontal infarcts with chronic multiple cerebral and cerebellar strokes. MRA brain negative for LVO. Carotid dopplers were negative for ICA  stenosis. 2 D echo showed EF 60-65% with aortic sclerosis without stenosis. She reported not taking ASA due to issues with gastritis.    Dr. Leonie Man felt that L-ACA stroke likely due to embolization from DVT v/s cardiogenic source. BLE dopplers were negative for DVT and she underwent TEE w/loop recorder placement today. Patient with resultant RLE weakness with dizziness affecting balance and mobility. CIR recommended due to functional decline. Patient transferred to CIR on 11/15/2021 .   Patient currently requires  CGA  with mobility secondary to muscle weakness, decreased cardiorespiratoy endurance, unbalanced muscle activation and decreased motor planning, and decreased standing balance, hemiplegia, and decreased balance strategies.  Prior to hospitalization, patient was modified independent  with mobility and lived with Spouse, Daughter, Son (dtr and son in Sports coach in good health-work from home; husband is on disability Hx of blood clots, uses a crutch to get around.) in a House home.  Home access is  Ramped entrance.  Patient will benefit from skilled PT intervention to maximize safe functional mobility, minimize fall risk, and decrease caregiver burden for planned discharge home with intermittent assist.  Anticipate patient will benefit from follow up [facility] at discharge.  PT - End of Session Activity Tolerance: Tolerates 10 - 20 min activity with multiple rests Endurance Deficit: Yes Endurance Deficit Description: Pt benefits from seated rest break after standing for self care x 5 minutes; also limited by hip pain PT Assessment Rehab Potential (ACUTE/IP ONLY): Good PT Barriers to Discharge: Decreased caregiver  support;Insurance for SNF coverage;Lack of/limited family support PT Patient demonstrates impairments in the following area(s): Balance;Endurance;Motor;Safety;Sensory;Skin Integrity PT Transfers Functional Problem(s): Bed Mobility;Bed to Chair;Car PT Locomotion Functional Problem(s):  Ambulation;Wheelchair Mobility;Stairs PT Plan PT Intensity: Minimum of 1-2 x/day ,45 to 90 minutes PT Frequency: 5 out of 7 days PT Duration Estimated Length of Stay: 5-7 days PT Treatment/Interventions: Ambulation/gait training;Balance/vestibular training;Community reintegration;Discharge planning;Disease management/prevention;DME/adaptive equipment instruction;Functional mobility training;Neuromuscular re-education;Pain management;Patient/family education;Psychosocial support;Splinting/orthotics;Stair training;Therapeutic Activities;Therapeutic Exercise;UE/LE Strength taining/ROM;UE/LE Coordination activities;Wheelchair propulsion/positioning PT Transfers Anticipated Outcome(s): mod I with LRAD PT Locomotion Anticipated Outcome(s): mod I with LRAD PT Recommendation Follow Up Recommendations: Home health PT Patient destination: Home Equipment Recommended: To be determined   PT Evaluation Precautions/Restrictions Precautions Precautions: Fall Restrictions Weight Bearing Restrictions: No Pain Pain Assessment Pain Scale: 0-10 Pain Score: 3  Pain Type: Chronic pain Pain Location: Hip Pain Orientation: Right Pain Descriptors / Indicators: Aching Pain Onset: On-going Pain Intervention(s): Repositioned;Rest;Ambulation/increased activity Multiple Pain Sites: No Pain Interference Pain Interference Pain Effect on Sleep: 4. Almost constantly Pain Interference with Therapy Activities: 2. Occasionally Pain Interference with Day-to-Day Activities: 3. Frequently Home Living/Prior [place] expects to be discharged to:: Private residence Living Arrangements: Spouse/significant other;Children;Other relatives Available Help at Discharge: Family;Available PRN/intermittently Type of Home: House Home Access: Ramped entrance Home Layout: One level;Other (Comment) (sunken den; 4 stairs with rails bilaterally) Bathroom Shower/Tub: Tub/shower unit (grab bar) Bathroom  Toilet: Standard Bathroom Accessibility: Yes Additional Comments: spouse is disabled and patient is primary CG.  SIL and diaghter stay with them, work during the day at home, but can only provide home management, meals and community mobility.  Lives With: Spouse;Daughter;Son (dtr and son in law in good health-work from home; husband is on disability Hx of blood clots, uses a crutch to get around.) Prior Function Level of Independence: Independent with basic ADLs;Independent with transfers;Requires assistive device for independence;Independent with gait (reports her balance has been pretty bad for about a year with history of several falls)  Able to Take Stairs?: Yes (but holding onto rails "for dear life") Driving: No (husband and SIL assist with transportation) Vocation: Retired Public house manager Requirements: mod I ambulating with cane for short community distances. Ambulated household distances by furniture walking. Leisure: Hobbies-yes (Comment) (gardening, reading, artwork) Vision/Perception  Vision - History Ability to See in Adequate Light: 0 Adequate Vision - Assessment Eye Alignment: Within Functional Limits Ocular Range of Motion: Within Functional Limits Alignment/Gaze Preference: Within Defined Limits Tracking/Visual Pursuits: Able to track stimulus in all quads without difficulty Saccades: Within functional limits Perception Perception: Within Functional Limits Praxis Praxis: Intact  Cognition Overall Cognitive Status: Impaired/Different from baseline Arousal/Alertness: Awake/alert Orientation Level: Oriented X4 Year: 2023 Month: February Day of Week: Correct Attention: Sustained;Selective Focused Attention: Appears intact Sustained Attention: Appears intact Selective Attention: Impaired Selective Attention Impairment: Functional basic;Verbal basic Memory: Impaired (reports baseline) Memory Impairment: Storage deficit;Decreased recall of new information;Decreased short term  memory Decreased Short Term Memory: Verbal basic Immediate Memory Recall: Sock;Blue;Bed Memory Recall Sock: Without Cue Memory Recall Blue: Without Cue Memory Recall Bed: Without Cue Awareness: Appears intact Problem Solving: Impaired Problem Solving Impairment: Verbal basic;Verbal complex;Functional basic Safety/Judgment: Appears intact Sensation Sensation Light Touch: Impaired by gross assessment Hot/Cold: Appears Intact Proprioception: Impaired by gross assessment Stereognosis: Appears Intact Additional Comments: Reports decreased sensation on R side from previous stroke. Able to discern light touch but unable to provide accurate descriptors for location. She also has baseline neuropathy from DM Coordination Gross Motor Movements are Fluid and Coordinated: No Fine  Motor Movements are Fluid and Coordinated: Yes Finger Nose Finger Test: [facility] BUE Heel Shin Test: [facility]. Slowed on R due to hip weakness Motor  Motor Motor: Hemiplegia Motor - Skilled Clinical Observations: R Hemi (RLE>RUE)   Trunk/Postural Assessment  Cervical Assessment Cervical Assessment: Exceptions to [facility] (forward head, left cervical rotation) Thoracic Assessment Thoracic Assessment: Exceptions to [facility] (rounded shoulders) Lumbar Assessment Lumbar Assessment: Exceptions to [facility] (rigid posterior pelvic tilt) Postural Control Postural Control: Within Functional Limits  Balance Balance Balance Assessed: Yes Static Sitting Balance Static Sitting - Balance Support: Feet unsupported;No upper extremity supported Static Sitting - Level of Assistance: 5: Stand by assistance Dynamic Sitting Balance Dynamic Sitting - Balance Support: Feet unsupported;No upper extremity supported Dynamic Sitting - Level of Assistance: 5: Stand by assistance Static Standing Balance Static Standing - Balance Support: No upper extremity supported;During functional activity Static Standing - Level of Assistance: Other (comment)  (CGA) Dynamic Standing Balance Dynamic Standing - Balance Support: No upper extremity supported;During functional activity Dynamic Standing - Level of Assistance: 4: Min assist Extremity Assessment  RUE Assessment RUE Assessment: Exceptions to [facility] General Strength Comments: 4/5 RUE LUE Assessment LUE Assessment: Within Functional Limits RLE Assessment RLE Assessment: Exceptions to [facility] RLE Strength RLE Overall Strength: Deficits Right Hip Flexion: 3+/5 Right Hip ABduction: 3+/5 Right Hip ADduction: 3+/5 Right Knee Flexion: 4-/5 Right Knee Extension: 4-/5 Right Ankle Dorsiflexion: 4-/5 LLE Assessment LLE Assessment: Exceptions to WFL LLE Strength LLE Overall Strength: Deficits Left Hip Flexion: 4-/5 Left Hip ABduction: 4-/5 Left Hip ADduction: 4-/5 Left Knee Flexion: 4-/5 Left Knee Extension: 4/5 Left Ankle Dorsiflexion: 4/5  Care Tool Care Tool Bed Mobility Roll left and right activity   Roll left and right assist level: Supervision/Verbal cueing    Sit to lying activity   Sit to lying assist level: Supervision/Verbal cueing    Lying to sitting on side of bed activity   Lying to sitting on side of bed assist level: the ability to move from lying on the back to sitting on the side of the bed with no back support.: Supervision/Verbal cueing     Care Tool Transfers Sit to stand transfer   Sit to stand assist level: Contact Guard/Touching assist    Chair/bed transfer   Chair/bed transfer assist level: Contact Guard/Touching assist     Toilet transfer   Assist Level: Minimal Assistance - Patient > 75%    Car transfer Car transfer activity did not occur: Safety/medical concerns        Care Tool Locomotion Ambulation   Assist level: Contact Guard/Touching assist Assistive device: Walker-rolling Max distance: 129f  Walk 10 feet activity   Assist level: Contact Guard/Touching assist Assistive device: Walker-rolling   Walk 50 feet with 2 turns activity    Assist level: Contact Guard/Touching assist Assistive device: Walker-rolling  Walk 150 feet activity   Assist level: Contact Guard/Touching assist Assistive device: Walker-rolling  Walk 10 feet on uneven surfaces activity Walk 10 feet on uneven surfaces activity did not occur: Safety/medical concerns      Stairs   Assist level: Contact Guard/Touching assist Stairs assistive device: 2 hand rails Max number of stairs: 8  Walk up/down 1 step activity   Walk up/down 1 step (curb) assist level: Contact Guard/Touching assist Walk up/down 1 step or curb assistive device: 2 hand rails  Walk up/down 4 steps activity   Walk up/down 4 steps assist level: Contact Guard/Touching assist Walk up/down 4 steps assistive device: 2 hand rails  Walk up/down 12  steps activity Walk up/down 12 steps activity did not occur: Safety/medical concerns (fatigue)      Pick up small objects from floor Pick up small object from the floor (from standing position) activity did not occur: Safety/medical concerns      Wheelchair Is the patient using a wheelchair?: Yes Type of Wheelchair: Manual   Wheelchair assist level: Supervision/Verbal cueing Max wheelchair distance: 20f  Wheel 50 feet with 2 turns activity   Assist Level: Supervision/Verbal cueing  Wheel 150 feet activity Wheelchair 150 feet activity did not occur: Safety/medical concerns      Refer to Care Plan for [id number] PT Short Term Goal 1 (Week 1): STG = LTG due to ELOS  Recommendations for other services: None   Skilled Therapeutic Intervention Mobility Bed Mobility Bed Mobility: Rolling Right;Right Sidelying to Sit Rolling Right: Supervision/verbal cueing Right Sidelying to Sit: Supervision/Verbal cueing Transfers Transfers: Sit to Stand;Stand to Sit;Stand Pivot Transfers Sit to Stand: Contact Guard/Touching assist Stand to Sit: Minimal Assistance - Patient > 75% Stand Pivot Transfers: Minimal  Assistance - Patient > 75% Stand Pivot Transfer Details: Tactile cues for posture Transfer (Assistive device): Rolling walker Locomotion  Gait Gait Assistance: Contact Guard/Touching assist Gait Distance (Feet): 150 Feet Assistive device: Rolling walker Gait Gait: Yes Gait Pattern: Impaired Gait Pattern: Step-through pattern;Decreased step length - right;Decreased stance time - left;Decreased dorsiflexion - right;Decreased weight shift to right;Poor foot clearance - right;Trunk flexed Gait velocity: decreased Stairs / Additional Locomotion Stairs: Yes Stairs Assistance: Contact Guard/Touching assist Stair Management Technique: Two rails Number of Stairs: 8 Height of Stairs: 6 Wheelchair Mobility Wheelchair Mobility: Yes Wheelchair Assistance: SChartered loss adjuster Both upper extremities Wheelchair Parts Management: Needs assistance Distance: 745f  Skilled Intervention: Pt seated in recliner to start PT evaluation. Pt agreeable, pleasant and cooperative throughout evaluation. Alert and oriented x4, reports residual R sided weakness from prior CVA. Pt confirms ramped entrance single story home, lives with her disabled husband and heMining engineerSIL/daughter can provide intermittent assist, husband can provide 24/7 supervision. Confirms PLOF as mod I with SPC for short community distances, used a wheelchair for community distances. Pt completed sit<>stand to RW with CGA. Ambulated ~15056fith CGA and RW to ortho rehab gym. Primary gait deficits include decreased R step length, decreased gait speed, narrow BOS, trunk flexed. No LOB but gait speed very slowed. Retrieved 18x16 w/c from DME closet to allow transportation b/w therapy gyms for energy conservation. Transported to main rehab gym and she completed 8 steps with CGA using 2 hand rails, step-to pattern. Able to propel herself with supervision in w/c, [id number] BUE to propel - distance limited by  fatigue. Transported remaining distance to her room and assisted back to recliner via stand<>pivot transfer with CGA and RW. Remained seated in recliner with chair alarm on, all needs in reach, informed of upcoming therapy schedule.   Discharge Criteria: Patient will be discharged from PT if patient refuses treatment 3 consecutive times without medical reason, if treatment goals not met, if there is a change in medical status, if patient makes no progress towards goals or if patient is discharged from hospital.  The above assessment, treatment plan, treatment alternatives and goals were discussed and mutually agreed upon: by patient  ChriAlger Simons DPT 11/16/2021, 12:28 PM

## 2021-11-16 NOTE — Evaluation (Signed)
Speech Language Pathology Assessment and Plan ° °Patient Details  °Name: Connie Campbell °MRN: [id number] °Date of Birth: 12/26/1950 ° °SLP Diagnosis: N/A °Rehab Potential: N/A °ELOS: N/A ° ° °Today's Date: 11/16/2021 °SLP Individual Time: 1300-1330 °SLP Individual Time Calculation (min): 30 min ° ° °Hospital Problem: Principal Problem: °  Ischemic stroke of frontal lobe ([facility]) ° °Past Medical History:  °Past Medical History:  °Diagnosis Date  ° Bilateral neck pain   ° with tension headaches at times.  ° Celiac disease   ° Chronic pain   ° BUE/BLE and constant.  ° Class 1 obesity due to excess calories with body mass index (BMI) of 31.0 to 31.9 in adult   ° Dyslipidemia   ° Dystonia   ° Glaucoma   ° Hypertension   ° Hypothyroidism (acquired)   ° Migraines   ° Stroke ([facility])   ° Trigeminal neuralgia   ° °Past Surgical History:  °Past Surgical History:  °Procedure Laterality Date  ° ABDOMINAL HYSTERECTOMY    ° APPENDECTOMY    ° cervical tumor  1991  ° C1-3 benign tumor, residual speech deficit and dysphagia  ° CHOLECYSTECTOMY    ° LOOP RECORDER INSERTION N/A 11/15/2021  ° Procedure: LOOP RECORDER INSERTION;  Surgeon: Lambert, Cameron T, MD;  [facility]: [facility];  Service: Cardiovascular;  Laterality: N/A;  ° ° °Assessment / Plan / Recommendation °Clinical Impression Patient is a 71 y.o. female with medical history significant for glaucoma, CVA and HTN presenting inability to walk, difficulty getting out of bed and dizziness that started 11/07/21. Pt was unable to lift her R leg and was shuffling. Of note, her first stroke led to R-sided deficits and her second stroke led to speech and cognitive issues. Her speech is unchanged from baseline.  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 She presented to [place] ED 11/09/21.  MRI showed Small acute L frontal lobe infarct in the distribution of ACA; multiple chronic areas of ischemia in cerebrum and cerebellum. MRA showed   No large vessel occlusion. Carotid  Doppler revealed  Bilateral ICA with 1-39% stenosis. 2D Echo: LVEF 60-65%, Trivial MVR and AVR. Aortic valve sclerosis without stenosis. LE Doppler no evidence of DVT. Plan for loop recorder prior to discharge. CIR recommended and patient admitted 11/15/21.  ° °Upon arrival, patient eating her lunch and agreeable to SLP evaluation. Patient alternating her attention between functional conversation and self-feeding independently without cues needed for redirection. Patient declined a "formal evaluation" as she reports dyslexia and difficulty with numbers and visual spatial tasks at baseline. Patient also reports impaired short-term recall at baseline but able to verbally provide compensatory strategies that she utilizes at home to maximize recall and carryover of information. Patient's husband present during evaluation and confirmed all information. Both the patient and her husband report that the patient is at her cognitive, swallowing and communication baseline and will have the necessary level of cognitive support needed at discharge, therefore, skilled SLP intervention is not warranted at this time.  ° °  °Skilled Therapeutic Interventions          Administered a cognitive-linguistic evaluation, please see above for details.   °SLP Assessment ° Patient does not need any further Speech Lanaguage Pathology Services  °  °Recommendations ° Oral Care Recommendations: Oral care BID °Patient destination: Home °Follow up Recommendations: None °Equipment Recommended: None recommended by SLP  °  °SLP Frequency N/A  °SLP Duration ° °SLP Intensity ° °SLP Treatment/Interventions N/A ° °N/A ° °N/A  ° °  Pain °Pain Assessment °Pain Scale: 0-10 °Pain Score: 3  °Pain Type: Chronic pain °Pain Location: Hip °Pain Orientation: Right °Pain Descriptors / Indicators: Aching °Pain Onset: On-going °Pain Intervention(s): Repositioned;Rest;Ambulation/increased activity ° °Prior Functioning °Type of Home: House ° Lives With: Spouse;Daughter;Son  (dtr and son in law in good health-work from home; husband is on disability Hx of blood clots, uses a crutch to get around.) °Available Help at Discharge: Family;Available PRN/intermittently °Vocation: Retired ° °SLP Evaluation °Cognition °Overall Cognitive Status: History of cognitive impairments - at baseline °Arousal/Alertness: Awake/alert °Orientation Level: Oriented X4 °Attention: Sustained;Selective °Focused Attention: Appears intact °Sustained Attention: Appears intact °Selective Attention: Appears intact °Selective Attention Impairment: Functional basic;Verbal basic °Memory: Impaired °Memory Impairment: Storage deficit;Decreased recall of new information;Decreased short term memory °Problem Solving: Impaired °Problem Solving Impairment: Functional complex °Safety/Judgment: Appears intact  °Comprehension °Auditory Comprehension °Overall Auditory Comprehension: Appears within functional limits for tasks assessed °Expression °Expression °Primary Mode of Expression: Verbal °Verbal Expression °Overall Verbal Expression: Appears within functional limits for tasks assessed °Written Expression °Dominant Hand: Right °Written Expression: Not tested °Oral Motor °Oral Motor/Sensory Function °Overall Oral Motor/Sensory Function: Within functional limits °Motor Speech °Overall Motor Speech: Impaired at baseline °Respiration: Within functional limits °Phonation: Normal °Resonance: Hypernasality °Articulation: Within functional limitis °Intelligibility: Intelligible °Motor Planning: Witnin functional limits ° °Care Tool °Care Tool Cognition °Ability to hear (with hearing aid or hearing appliances if normally used Ability to hear (with hearing aid or hearing appliances if normally used): 0. Adequate - no difficulty in normal conservation, social interaction, listening to TV °  °Expression of Ideas and Wants Expression of Ideas and Wants: 4. Without difficulty (complex and basic) - expresses complex messages without  difficulty and with speech that is clear and easy to understand °  °Understanding Verbal and Non-Verbal Content Understanding Verbal and Non-Verbal Content: 4. Understands (complex and basic) - clear comprehension without cues or repetitions  °Memory/Recall Ability Memory/Recall Ability : Current season;That he or she is in a hospital/hospital unit;Location of own room  ° ° °Short Term Goals: N/A ° ° °Refer to Care Plan for Long Term Goals ° °Recommendations for other services: None  ° °Discharge Criteria: Patient will be discharged from SLP if patient refuses treatment 3 consecutive times without medical reason, if treatment goals not met, if there is a change in medical status, if patient makes no progress towards goals or if patient is discharged from hospital. ° °The above assessment, treatment plan, treatment alternatives and goals were discussed and mutually agreed upon: by patient and by family ° °PAYNE, COURTNEY °11/16/2021, 2:42 PM ° ° °

## 2021-11-16 NOTE — Progress Notes (Signed)
Physical Therapy Session Note  Patient Details  Name: Connie Campbell MRN: [id number] Date of Birth: 1951-02-07  Today's Date: 11/16/2021 PT Individual Time: 8768-1157 PT Individual Time Calculation (min): 70 min   Short Term Goals: Week 1:  PT Short Term Goal 1 (Week 1): STG = LTG due to ELOS  Skilled Therapeutic Interventions/Progress Updates:   Pt seated in recliner to start session, sleeping but awakens to voice and agreeable to PT tx with some encouragement. Pt reports fatigue from a busy day of therapy. Treatment to tolerance with rest breaks provided for fatigue.   Sit<>stand to RW with CGA and stand<>pivot transfer with RW and CGA to w/c. Transported in w/c to main rehab gym for time and energy conservation.   Stand<>pivot transfer with close supervision and no AD from w/c to mat table. Completed BERG balance test with results listed below. Pt required frequent rest breaks throughout testing due to fatigue.   Patient demonstrates increased fall risk as noted by score of  28/56 on Berg Balance Scale.  (<36= high risk for falls, close to 100%; 37-45 significant >80%; 46-51 moderate >50%; 52-55 lower >25%)   Pt then completed 5xSTS test from low mat table without UE support.   Five times Sit to Stand Test (FTSS) Method: Use a straight back chair with a solid seat that is 16-18 high. Ask participant to sit on the chair with arms folded across their chest.   Instructions: Stand up and sit down as quickly as possible 5 times, keeping your arms folded across your chest.   Measurement: Stop timing when the participant stands the 5th time.  TIME: 39  (in seconds)  Times > 13.6 seconds is associated with increased disability and morbidity (Guralnik, 2000) Times > 15 seconds is predictive of recurrent falls in healthy individuals aged 79 and older (Buatois, et al., 2008) Normal performance values in community dwelling individuals aged 71 and older (Bohannon, 2006): 60-69 years: 11.4  seconds  MCID: ? 2.3 seconds for Vestibular Disorders (Meretta, 2006)   She reported some moderate (5/10) dizziness in standing while completing some components of the BERG. BP assessed in sitting in and standing:  Sitting: 119/77 HR 78 Standing: 106/72 HR85 *Pt symptomatic  Pt completed normal TUG with RW and supervision. Scored 41.5 seconds. Scores >13.5 seconds indicate increased falls risk.   Pt then completed 6MWT with RW and supervision. Covering 312f.  6 Min Walk Test:  Instructed patient to ambulate as quickly and as safely as possible for 6 minutes using LRAD. Patient was allowed to take standing rest breaks without stopping the test, but if the patient required a sitting rest break the clock would be stopped and the test would be over.  Results: 350 feet (Avg speed 0.265m) using a RW with supervision. Results indicate that the patient has reduced endurance with ambulation compared to age matched norms.  Age Matched Norms: 6088-69o M: 5760: [id number] M: 523: 471, 8073-89o M: 417 F: 392 MDC: 58.21 meters (190.98 feet) or 50 meters (ANPTA Core Set of Outcome Measures for Adults with Neurologic Conditions, 2018)   Pt returned to her room in the w/c for time and energy conservation. Completed stand<>pivot transfer with supervision and RW to recliner. Remained seated in recliner with chair alarm on, all needs within reach.   Therapy Documentation Precautions:  Precautions Precautions: Fall Restrictions Weight Bearing Restrictions: No General:    Therapy/Group: Individual Therapy  ChAlger Simons/06/2022, 2:34 PM

## 2021-11-16 NOTE — Progress Notes (Signed)
Inpatient Rehabilitation Care Coordinator Assessment and Plan Patient Details  Name: Connie Campbell MRN: [id number] Date of Birth: 03/29/1951  Today's Date: 11/16/2021  Hospital Problems: Principal Problem:   Ischemic stroke of frontal lobe [facility])  Past Medical History:  Past Medical History:  Diagnosis Date   Bilateral neck pain    with tension headaches at times.   Celiac disease    Chronic pain    BUE/BLE and constant.   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 She has never used smokeless tobacco. She reports current alcohol use. She reports that she does not use drugs.  Family / Support Systems Marital Status: Married Patient Roles: Spouse, Parent Spouse/Significant Other: Magda Paganini [phone] uses a crutch at home Children: Tresha-daughter [phone] Another daughter in [place] Other Supports: Son in -law works from their home Anticipated Caregiver: Husband can provide supervision only Ability/Limitations of Caregiver: HUsband is disabled and sues a crutch for mobility daughter and son in-law work from home but can not provide physical assist-home managment and transport-although husband still drives Caregiver Availability: 24/7 Family Dynamics: Close with two dauhgter's and step-son. Daughter and son in-law moved in with pt and  husband last fall to assist them  Social History Preferred language: English Religion:  Cultural Background: No issues Education: HS Health Literacy - How often do you need to have someone help you when you read instructions, pamphlets, or other written material from your doctor or pharmacy?: Never Writes: Yes Employment Status: Disabled Date Retired/Disabled/Unemployed: 1993 Public relations account executive Issues: No issues Guardian/Conservator: None-according to MD pt is capable of making her own decisions while here   Abuse/Neglect Abuse/Neglect Assessment Can Be Completed: Yes Physical Abuse: Denies Verbal Abuse: Denies Sexual Abuse: Denies Exploitation of patient/patient's resources: Denies Self-Neglect: Denies  Patient response to: Social Isolation - How often do you feel lonely or isolated from those around you?: Never  Emotional Status Pt's affect, behavior and adjustment status: Pt is wanting to get back to her prior functional status, she assisted with husband's care. She is motivated and hopeful she will, her speech issues were from her previous stroke. She hopes to be mobile and not require physical assist Recent Psychosocial Issues: other health issues from stroke-had residual deficits from past CVA Psychiatric History: No issues/history  seems to be coping appropriately and positive regarding being here on rehab Substance Abuse History: No issues  Patient / Family Perceptions, Expectations & Goals Pt/Family understanding of illness & functional limitations: Pt and husband can explain her stroke and deficits. Both have spoken with the MD and feel they have a good understanding of her treatment plan moving forward. Premorbid pt/family roles/activities: wife, mom, retiree, friend, church member Anticipated changes in roles/activities/participation: resume Pt/family expectations/goals: Pt states: "  I want to be able to take care of myself, when I leave here."  Husband states:  ' I can do some for her."  [employer]: Other (Comment) Premorbid Home Care/DME Agencies: Other (Comment) (has wc and rw both she and husband share) Transportation available at discharge: Husband still drives-pt quit after CVA son in-law drives also Is the patient able to respond to transportation needs?: Yes In the past 12 months, has lack of transportation kept you from medical appointments or from getting medications?: No In the past 12 months, has lack of transportation kept you from meetings, work, or from getting things needed for daily living?: No  Discharge Planning Living Arrangements: Spouse/significant other, Children, Other relatives Support Systems: Spouse/significant other, Children, Other relatives, Friends/neighbors, Church/faith community Type of Residence: Private residence Insurance Resources: [employer] (specify county), Multimedia programmer (specify) ([employer] Medicare) Financial Resources: Social Security, Family Support Financial Screen Referred: No Living Expenses: Own Money Management: Patient, Spouse Does the patient have any problems obtaining your medications?: No Home Management: Daughter and son in-law Patient/Family Preliminary Plans: Return home with husband, daughter and son in-law who will assist some, but pt will need to be supervision level to be safe home while daughter adn son in-law are working and husband uses a crtuch at home. Care Coordinator Barriers to Discharge: Decreased caregiver support, Insurance for SNF coverage Care Coordinator Anticipated Follow Up Needs: HH/OP  Clinical Impression Pleasant female who is motivated to do well here and regain her mobility before she leaves. Her daughter and son in-law live with she and husband now and help with home management and transport. Husband requires assist also but still drives. Will await therapy evaluations and work on discharge needs.  Elease Hashimoto 11/16/2021, 10:55  AM

## 2021-11-16 NOTE — Progress Notes (Addendum)
Inpatient Rehabilitation Admission Medication Review by a Pharmacist  A complete drug regimen review was completed for this patient to identify any potential clinically significant medication issues.  High Risk Drug Classes Is patient taking? Indication by Medication  Antipsychotic Yes Compazine for N/V  Anticoagulant Yes Lovenox for DVT prophx.  Antibiotic No   Opioid Yes Percocet for chronic pain   Antiplatelet Yes ASA (x3 wks), Plavix for CVA prophx.  Hypoglycemics/insulin Yes SSI, Levemir, metformin, Ozempic for DM  Vasoactive Medication No   Chemotherapy No   Other Yes Keppra for seizure prophx     Type of Medication Issue Identified Description of Issue Recommendation(s)  Drug Interaction(s) (clinically significant)     Duplicate Therapy     Allergy     No Medication Administration End Date     Incorrect Dose     Additional Drug Therapy Needed  PTA Jardiance and Telmisartan not resume Resume as needed  Significant med changes from prior encounter (inform family/care partners about these prior to discharge).    Other       Clinically significant medication issues were identified that warrant physician communication and completion of prescribed/recommended actions by midnight of the next day:  No  Time spent performing this drug regimen review (minutes):  15 min  Fantasha Daniele S. Alford Highland, PharmD, BCPS Clinical Staff Pharmacist [email or web address] [place] 11/16/2021 11:11 AM

## 2021-11-16 NOTE — Progress Notes (Signed)
PROGRESS NOTE   Subjective/Complaints: No new complaints this morning Restarted baclofen as dystonia was more painful without it. She would like to try magnesium HS Her husband shares a book from their guru.  ROS: +cervical dystonia  Objective:   No results found. Recent Labs    11/15/21 0220 11/16/21 0511  WBC 7.1 7.4  HGB 14.8 14.3  HCT 44.8 41.9  PLT 249 233   Recent Labs    11/15/21 0220 11/16/21 0511  NA 134* 135  K 4.2 4.1  CL 100 101  CO2 24 24  GLUCOSE 231* 132*  BUN 24* 22  CREATININE 0.90 0.76  CALCIUM 9.1 9.1    Intake/Output Summary (Last 24 hours) at 11/16/2021 1700 Last data filed at 11/16/2021 1345 Gross per 24 hour  Intake 480 ml  Output --  Net 480 ml        Physical Exam: Vital Signs Blood pressure 92/74, pulse 77, temperature (!) 97.3 F (36.3 C), resp. rate 17, height 5' 3"  (1.6 m), weight 83.3 kg, SpO2 95 %. Gen: no distress, normal appearing HEENT: oral mucosa pink and moist, NCAT Cardio: Reg rate Cardiovascular:     Rate and Rhythm: Normal rate.     Comments: Dry dressing on left chest. Abdomen: NT, ND Neurological:     Mental Status: She is alert and oriented to person, place, and time. +cervical dystonia    Comments: Decreased hearing. Right facial weakness without mild dysarthria. Right sided weakness with sensory deficits. Able to follow simple 1 and 2 step simple motor commands.      Assessment/Plan: 1. Functional deficits which require 3+ hours per day of interdisciplinary therapy in a comprehensive inpatient rehab setting. Physiatrist is providing close team supervision and 24 hour management of active medical problems listed below. Physiatrist and rehab team continue to assess barriers to discharge/monitor patient progress toward functional and medical goals  Care Tool:  Bathing              Bathing assist Assist Level: Minimal Assistance - Patient > 75%      Upper Body Dressing/Undressing Upper body dressing        Upper body assist Assist Level: Set up assist    Lower Body Dressing/Undressing Lower body dressing            Lower body assist Assist for lower body dressing: Minimal Assistance - Patient > 75%     Toileting Toileting    Toileting assist Assist for toileting: Minimal Assistance - Patient > 75%     Transfers Chair/bed transfer  Transfers assist     Chair/bed transfer assist level: Contact Guard/Touching assist     Locomotion Ambulation   Ambulation assist      Assist level: Contact Guard/Touching assist Assistive device: Walker-rolling Max distance: 170f   Walk 10 feet activity   Assist     Assist level: Contact Guard/Touching assist Assistive device: Walker-rolling   Walk 50 feet activity   Assist    Assist level: Contact Guard/Touching assist Assistive device: Walker-rolling    Walk 150 feet activity   Assist    Assist level: Contact Guard/Touching assist Assistive device: Walker-rolling    Walk  10 feet on uneven surface  activity   Assist Walk 10 feet on uneven surfaces activity did not occur: Safety/medical concerns         Wheelchair     Assist Is the patient using a wheelchair?: Yes Type of Wheelchair: Manual    Wheelchair assist level: Supervision/Verbal cueing Max wheelchair distance: 20f    Wheelchair 50 feet with 2 turns activity    Assist        Assist Level: Supervision/Verbal cueing   Wheelchair 150 feet activity     Assist  Wheelchair 150 feet activity did not occur: Safety/medical concerns       Blood pressure 92/74, pulse 77, temperature (!) 97.3 F (36.3 C), resp. rate 17, height 5' 3"  (1.6 m), weight 83.3 kg, SpO2 95 %.    Medical Problem List and Plan: 1. 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 Keep dressing on chest clean and dry.               -ELOS/Goals: 5-7 days modI             Messaged team to assess discharge recs.  2.  Antithrombotics: -DVT/anticoagulation:  Pharmaceutical: Lovenox             -antiplatelet therapy: DAPT X 3 weeks followed by plavix alone.  3. Chronic pain with dystonia/Pain Management: Continue percocet prn --has insomnia and sleeps in recline. Gets up once a night for pain pill.    -add back baclofen which was helping 4. Insomnia: increase magnesium to 5058mHS.  5. Neuropsych: This patient is capable of making decisions on her own behalf. 6. Skin/Wound Care: Routine pressure relief measures.  7. Fluids/Electrolytes/Nutrition: Monitor I/O. Check CMET in am.  8. HTN: Monitor BP TID- SBP goal <140             --continue Avapro (for micardis) 9.T2DM: Hgb A1C-8.0. Uncontrolled on Jardiance, Levemir 80U, increase metformin to 100060mID, Ozempic weekly on Wed PTA.--->patient will have husband bring in ozempic to resume regimen.  --Continue levemir 60 units. Resume Metformin at 500 mg TID as BS poorly controlled.  --monitor BS ac/hs. Use SSI for elevated BS and titrate insulin and metformin to home dose.   10. Seizure d/o: Stable and controlled on Keppra 750 mg bid  11. Dyslipidemia: will provide list of foods and supplements that can help lower LDL. See #13.  12. Hypothyroid: Managed on synthroid for supplement. 13. 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   LOS: 1 days A FACE TO FACE EVALUATION WAS PERFORMED  Londynn Sonoda P Izamar Linden 11/16/2021, 5:00 PM

## 2021-11-16 NOTE — Evaluation (Addendum)
Occupational Therapy Assessment and Plan  Patient Details  Name: Nithya Meriweather MRN: [id number] Date of Birth: 04/18/1951  OT Diagnosis: abnormal posture, cognitive deficits, and hemiplegia affecting dominant side Rehab Potential: Rehab Potential (ACUTE ONLY): Excellent ELOS: 5-7 days   Today's Date: 11/16/2021 OT Individual Time: 3545-6256 OT Individual Time Calculation (min): 70 min     Hospital Problem: Principal Problem:   Ischemic stroke of frontal lobe ([place])   Past Medical History:  Past Medical History:  Diagnosis Date   Bilateral neck pain    with tension headaches at times.   Celiac disease    Chronic pain    BUE/BLE and constant.   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, residual speech deficit and dysphagia   CHOLECYSTECTOMY     LOOP RECORDER INSERTION N/A 11/15/2021   Procedure: LOOP RECORDER INSERTION;  Surgeon: Vickie Epley, MD;  Location: [place] [facility];  Service: Cardiovascular;  Laterality: N/A;    Assessment & Plan Clinical Impression: Lavoris Canizales is a 71 year old RH female with history of glaucoma, L8LH, 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   MRI brain done revealing small acute right frontal infarcts with chronic multiple cerebral and cerebellar strokes. MRA brain negative for LVO. Carotid dopplers were negative for ICA stenosis. 2 D echo  showed EF 60-65% with aortic sclerosis without stenosis. She reported not taking ASA due to issues with gastritis.    Dr. Leonie Man felt that L-ACA stroke likely due to embolization from DVT v/s cardiogenic source. BLE dopplers were negative for DVT and she underwent TEE w/loop recorder placement today. Patient with resultant RLE weakness with dizziness affecting balance and mobility.  Patient transferred to CIR on 11/15/2021 .    Patient currently requires min with basic self-care skills secondary to muscle weakness, decreased cardiorespiratoy endurance, decreased coordination, decreased problem solving, decreased memory, and delayed processing, and decreased sitting balance, decreased standing balance, decreased postural control, hemiplegia, and decreased balance strategies.  Prior to hospitalization, patient could complete ADLs with modified independent .  Patient will benefit from skilled intervention to increase independence with basic self-care skills prior to discharge home with care partner.  Anticipate patient will require intermittent supervision and follow up home health.  OT - End of Session Activity Tolerance: Tolerates 30+ min activity with multiple rests Endurance Deficit: Yes Endurance Deficit Description: Pt benefits from seated rest break after standing for self care x 5 minutes; also limited by hip pain OT Assessment Rehab Potential (ACUTE ONLY): Excellent OT Barriers to Discharge: Decreased caregiver support;Home environment access/layout OT Barriers to Discharge Comments: Pts dtr and son live with her but work from home.  Husband is disabled . Pt has sunken living room OT Patient demonstrates impairments in the following area(s): Balance;Cognition;Endurance;Motor;Pain;Safety;Sensory OT Basic ADL's Functional Problem(s): Grooming;Bathing;Toileting;Dressing  OT Transfers Functional Problem(s): Toilet;Tub/Shower OT Plan OT Intensity: Minimum of 1-2 x/day, 45 to 90 minutes OT  Frequency: 5 out of 7 days OT Duration/Estimated Length of Stay: 5-7 days OT Treatment/Interventions: Balance/vestibular training;Discharge planning;Pain management;Self Care/advanced ADL retraining;Therapeutic Activities;UE/LE Coordination activities;Cognitive remediation/compensation;Disease mangement/prevention;Functional mobility training;Patient/family education;Therapeutic Exercise;Visual/perceptual remediation/compensation;DME/adaptive equipment instruction;Neuromuscular re-education;Psychosocial support;Community reintegration;UE/LE Strength taining/ROM OT Self Feeding Anticipated Outcome(s): independent OT Basic Self-Care Anticipated Outcome(s): mod I-sup OT Toileting Anticipated Outcome(s): mod I OT Bathroom Transfers Anticipated Outcome(s): mod I OT Recommendation Patient destination: Home Follow Up Recommendations: Home health OT Equipment Recommended: To be determined Equipment Details: has a BSC and shower chair   OT Evaluation Precautions/Restrictions  Precautions Precautions: Fall Restrictions Weight Bearing Restrictions: No General Chart Reviewed: Yes Pain Pain Assessment Pain Scale: 0-10 Pain Score: 3  Pain Type: Chronic pain Pain Location: Hip Pain Orientation: Right Pain Descriptors / Indicators: Aching Pain Onset: On-going Pain Intervention(s): Repositioned;Rest;Ambulation/increased activity Multiple Pain Sites: No Home Living/Prior Functioning Home Living Family/patient expects to be discharged to:: Private residence Living Arrangements: Spouse/significant other, Children, Other relatives Available Help at Discharge: Family, Available PRN/intermittently Type of Home: House Home Access: Ramped entrance Home Layout: One level, Other (Comment) (sunken den; 4 stairs with rails bilaterally) Bathroom Shower/Tub: Tub/shower unit (grab bar) Bathroom Toilet: Standard Bathroom Accessibility: Yes Additional Comments: spouse is disabled and patient is primary CG.   SIL and diaghter stay with them, work during the day at home, but can only provide home management, meals and community mobility.  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 Ambulated household distances by furniture walking. 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 Unable to see things far away) Vision Assessment?: Yes Eye Alignment: Within Functional Limits Ocular Range of Motion: Within Functional Limits Alignment/Gaze Preference: Within Defined Limits Tracking/Visual Pursuits: Able to track stimulus in all quads without difficulty Saccades: Within functional limits Visual Fields: No apparent deficits Perception  Perception: Within Functional Limits Praxis Praxis: Intact Cognition Overall Cognitive Status: Impaired/Different from baseline Arousal/Alertness: Awake/alert Orientation Level: Person;Place;Situation Person: Oriented Place: Oriented Situation: Oriented Year: 2023 Month: February Day of Week: Correct Memory: Impaired (reports baseline) Memory Impairment: Storage deficit;Decreased recall of new  information;Decreased short term memory Decreased Short Term Memory: Verbal basic Immediate Memory Recall: Sock;Blue;Bed Memory Recall Sock: Without Cue Memory Recall Blue: Without Cue Memory Recall Bed: Without Cue Attention: Sustained;Selective Focused Attention: Appears intact Sustained Attention: Appears intact Selective Attention: Impaired Selective Attention Impairment: Functional basic;Verbal basic Awareness: Appears intact Problem Solving: Impaired Problem Solving Impairment: Verbal basic;Verbal complex;Functional basic Safety/Judgment: Appears intact Sensation Sensation Light Touch: Impaired by gross assessment Hot/Cold: Appears Intact Proprioception: Impaired by gross assessment Stereognosis: Appears Intact Additional Comments: Reports decreased sensation on R side from previous stroke. Able to discern light touch but unable to provide accurate descriptors for  location. She also has baseline neuropathy from DM Coordination Gross Motor Movements are Fluid and Coordinated: No Fine Motor Movements are Fluid and Coordinated: Yes Finger Nose Finger Test: [facility] BUE Heel Shin Test: [facility]. Slowed on R due to hip weakness Motor  Motor Motor: Hemiplegia Motor - Skilled Clinical Observations: R Hemi (RLE>RUE)  Trunk/Postural Assessment  Cervical Assessment Cervical Assessment: Exceptions to [facility] (forward head, left cervical rotation) Thoracic Assessment Thoracic Assessment: Exceptions to [facility] (rounded shoulders) Lumbar Assessment Lumbar Assessment: Exceptions to [facility] (rigid posterior pelvic tilt) Postural Control Postural Control: Within Functional Limits  Balance Balance Balance Assessed: Yes Static Sitting Balance Static Sitting - Balance Support: Feet unsupported;No upper extremity supported Static Sitting - Level of Assistance: 5: Stand by assistance Dynamic Sitting Balance Dynamic Sitting - Balance Support: Feet unsupported;No upper extremity supported Dynamic Sitting -  Level of Assistance: 5: Stand by assistance Static Standing Balance Static Standing - Balance Support: No upper extremity supported;During functional activity Static Standing - Level of Assistance: Other (comment) (CGA) Dynamic Standing Balance Dynamic Standing - Balance Support: No upper extremity supported;During functional activity Dynamic Standing - Level of Assistance: 4: Min assist Extremity/Trunk Assessment RUE Assessment RUE Assessment: Exceptions to [facility] General Strength Comments: 4/5 RUE LUE Assessment LUE Assessment: Within Functional Limits  Care Tool Care Tool Self Care Eating   Eating Assist Level: Set up assist    Oral Care    Oral Care Assist Level: Contact Guard/Toucning assist    Bathing         Assist Level: Minimal Assistance - Patient > 75%    Upper Body Dressing(including orthotics)       Assist Level: Set up assist    Lower Body Dressing (excluding footwear)     Assist for lower body dressing: Minimal Assistance - Patient > 75%    Putting on/Taking off footwear     Assist for footwear: Minimal Assistance - Patient > 75%       Care Tool Toileting Toileting activity   Assist for toileting: Minimal Assistance - Patient > 75%     Care Tool Bed Mobility Roll left and right activity   Roll left and right assist level: Supervision/Verbal cueing    Sit to lying activity   Sit to lying assist level: Supervision/Verbal cueing    Lying to sitting on side of bed activity   Lying to sitting on side of bed assist level: the ability to move from lying on the back to sitting on the side of the bed with no back support.: Supervision/Verbal cueing     Care Tool Transfers Sit to stand transfer   Sit to stand assist level: Contact Guard/Touching assist    Chair/bed transfer   Chair/bed transfer assist level: Contact Guard/Touching assist     Toilet transfer   Assist Level: Minimal Assistance - Patient > 75%     Care Tool Cognition  Expression  of Ideas and Wants Expression of Ideas and Wants: 4. Without difficulty (complex and basic) - expresses complex messages without difficulty and with speech that is clear and easy to understand  Understanding Verbal and Non-Verbal Content Understanding Verbal and Non-Verbal Content: 4. Understands (complex and basic) - clear comprehension without cues or repetitions   Memory/Recall Ability Memory/Recall Ability : Current season;That he or she is in a hospital/hospital unit;Location of own room   Refer to Care Plan for Long Term Goals  SHORT TERM GOAL WEEK 1 OT Short Term Goal 1 (Week 1): STGs=LTGS due to ELOS  Recommendations  for other services: None    Skilled Therapeutic Intervention ADL ADL Grooming: Contact guard Where Assessed-Grooming: Standing at sink Lower Body Dressing: Minimal assistance Where Assessed-Lower Body Dressing: Wheelchair Toileting: Minimal assistance Where Assessed-Toileting: Bedside Commode Toilet Transfer: Minimal assistance Toilet Transfer Method: Stand pivot Toilet Transfer Equipment: Bedside commode Mobility  Bed Mobility Bed Mobility: Rolling Right;Right Sidelying to Sit Rolling Right: Supervision/verbal cueing Right Sidelying to Sit: Supervision/Verbal cueing Transfers Sit to Stand: Contact Guard/Touching assist Stand to Sit: Minimal Assistance - Patient > 75%   Discharge Criteria: Patient will be discharged from OT if patient refuses treatment 3 consecutive times without medical reason, if treatment goals not met, if there is a change in medical status, if patient makes no progress towards goals or if patient is discharged from hospital.  The above assessment, treatment plan, treatment alternatives and goals were discussed and mutually agreed upon: by patient  Ezekiel Slocumb 11/16/2021, 12:37 PM

## 2021-11-16 NOTE — Progress Notes (Signed)
Inpatient Rehabilitation  Patient information reviewed and entered into eRehab system by Abel Ra Kimbly Eanes, OTR/L.   Information including medical coding, functional ability and quality indicators will be reviewed and updated through discharge.    

## 2021-11-16 NOTE — Progress Notes (Signed)
[place] Individual Statement of Services  Patient Name:  Connie Campbell  Date:  11/16/2021  Welcome to the [facility].  Our goal is to provide you with an individualized program based on your diagnosis and situation, designed to meet your specific needs.  With this comprehensive rehabilitation program, you will be expected to participate in at least 3 hours of rehabilitation therapies Monday-Friday, with modified therapy programming on the weekends.  Your rehabilitation program will include the following services:  Physical Therapy (PT), Occupational Therapy (OT), Speech Therapy (ST), 24 hour per day rehabilitation nursing, Therapeutic Recreaction (TR), Care Coordinator, Rehabilitation Medicine, Nutrition Services, and Pharmacy Services  Weekly team conferences will be held on Wednesday to discuss your progress.  Your Inpatient Rehabilitation Care Coordinator will talk with you frequently to get your input and to update you on team discussions.  Team conferences with you and your family in attendance may also be held.  Expected length of stay: 5-7 days  Overall anticipated outcome: mod/I level  Depending on your progress and recovery, your program may change. Your Inpatient Rehabilitation Care Coordinator will coordinate services and will keep you informed of any changes. Your Inpatient Rehabilitation Care Coordinator's name and contact numbers are listed  below.  The following services may also be recommended but are not provided by the [facility]:   [place] will be made to provide these services after discharge if needed.  Arrangements include referral to agencies that provide these services.  Your insurance has been verified to be:  [employer] & Medicaid Your primary doctor is:  Tiana Loft  Pertinent information will be shared with your  doctor and your insurance company.  Inpatient Rehabilitation Care Coordinator:  Ovidio Kin, [place] or (Emilia Beck  Information discussed with and copy given to patient by: Elease Hashimoto, 11/16/2021, 11:02 AM

## 2021-11-16 NOTE — Progress Notes (Signed)
Occupational Therapy Session Note  Patient Details  Name: Connie Campbell MRN: [id number] Date of Birth: 08-01-1951  Today's Date: 11/17/2021 OT Individual Time: 7618-4859 OT Individual Time Calculation (min): 57 min   Short Term Goals: Week 1:  OT Short Term Goal 1 (Week 1): STGs=LTGS due to ELOS  Skilled Therapeutic Interventions/Progress Updates:    Pt greeted in the recliner with c/o pain in most areas of her body. Per pt, she thinks that she has fibromyalgia, does have arthritis. Nurse notified and was in at start of session to provide pain medicine. Pt was agreeable to wash her hair at the sink today. CGA for ambulatory transfer to the w/c parked in front of the sink with pt using the RW. Worked on Rt UE strength/NMR while holding ropes of hair washing tray and when using the affected hand to comb and blow-dry hair, also to pin up hair with barrett. Pts affect visibly brightened afterwards, said that she liked the feeling of heat. Supervision for standing balance while completing oral care. 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 OT brought pt some drawing supplies and pt able to exhibit a tripod grasp with her affected Rt hand while she drew 2 small pictures. Pt states that she feels her hand is functioning "90%" of how it was before the CVA. Discussed risk factors for CVA and preventive measures. Pt remained sitting up at close of session, all needs within reach and chair alarm set.   Therapy Documentation Precautions:  Precautions Precautions: Fall Restrictions Weight Bearing Restrictions: No Vital Signs: Therapy Vitals Temp: (!) 97.5 F (36.4 C) Temp Source: Oral Pulse Rate: 80 Resp: 17 BP: 103/62 Patient Position (if appropriate): Sitting Oxygen Therapy SpO2: 97 % O2 Device: Room Air ADL: ADL Grooming: Contact guard Where Assessed-Grooming: Standing at sink Lower Body Dressing: Minimal assistance Where  Assessed-Lower Body Dressing: Wheelchair Toileting: Minimal assistance Where Assessed-Toileting: Bedside Commode Toilet Transfer: Minimal assistance Toilet Transfer Method: Stand Radiation protection practitioner Equipment: Bedside commode   Therapy/Group: Individual Therapy  Neveen Daponte A Quina Wilbourne 11/17/2021, 3:54 PM

## 2021-11-16 NOTE — Progress Notes (Signed)
Patient ID: Connie Campbell, female   DOB: 1951-07-14, 72 y.o.   MRN: [id number] Met with the patient to review role, rehab process, team conference and plan of care. Discussed secondary risks including DM (A1c *.0), HTN, HLD (LDL 90/Trig 166) and previous strokes. Patient noted she is a vegetarian and has multi allergies; probably does eat more carbs than she realizes, likes cookies and will eat a couple when she feels like her sugar levels are dropping. Reviewed additional options for sustained blood sugar levels along with medications. Reports intolerance to statins and has been refusing lipitor, MD aware. Also noted used baclofen at home prn and before bed for dystonia. Loop recorder site unremarkable. Continue to follow along to discharge to address educational needs and facilitate preparation for discharge. Margarito Liner

## 2021-11-17 LAB — GLUCOSE, CAPILLARY
Glucose-Capillary: 129 mg/dL — ABNORMAL HIGH (ref 70–99)
Glucose-Capillary: 144 mg/dL — ABNORMAL HIGH (ref 70–99)
Glucose-Capillary: 179 mg/dL — ABNORMAL HIGH (ref 70–99)

## 2021-11-17 NOTE — Progress Notes (Signed)
PROGRESS NOTE   Subjective/Complaints: No new complaints this morning Restarted baclofen as dystonia was more painful without it. She would like to try magnesium HS Her husband shares a book from their guru.  ROS: +cervical dystonia  Objective:   No results found. Recent Labs    11/15/21 0220 11/16/21 0511  WBC 7.1 7.4  HGB 14.8 14.3  HCT 44.8 41.9  PLT 249 233   Recent Labs    11/15/21 0220 11/16/21 0511  NA 134* 135  K 4.2 4.1  CL 100 101  CO2 24 24  GLUCOSE 231* 132*  BUN 24* 22  CREATININE 0.90 0.76  CALCIUM 9.1 9.1    Intake/Output Summary (Last 24 hours) at 11/17/2021 1120 Last data filed at 11/17/2021 0730 Gross per 24 hour  Intake 960 ml  Output --  Net 960 ml        Physical Exam: Vital Signs Blood pressure (!) 99/59, pulse 69, temperature 98 F (36.7 C), temperature source Oral, resp. rate 18, height 5' 3"  (1.6 m), weight 83.3 kg, SpO2 96 %. Gen: no distress, normal appearing HEENT: oral mucosa pink and moist, NCAT Cardio: Reg rate Cardiovascular:     Rate and Rhythm: Normal rate.     Comments: Dry dressing on left chest. Abdomen: NT, ND Neurological:     Mental Status: She is alert and oriented to person, place, and time. +cervical dystonia    Comments: Decreased hearing. Right facial weakness without mild dysarthria. Right sided weakness with sensory deficits. Able to follow simple 1 and 2 step simple motor commands.      Assessment/Plan: 1. Functional deficits which require 3+ hours per day of interdisciplinary therapy in a comprehensive inpatient rehab setting. Physiatrist is providing close team supervision and 24 hour management of active medical problems listed below. Physiatrist and rehab team continue to assess barriers to discharge/monitor patient progress toward functional and medical goals  Care Tool:  Bathing              Bathing assist Assist Level: Minimal  Assistance - Patient > 75%     Upper Body Dressing/Undressing Upper body dressing   What is the patient wearing?: Dress    Upper body assist Assist Level: Minimal Assistance - Patient > 75%    Lower Body Dressing/Undressing Lower body dressing      What is the patient wearing?: Underwear/pull up     Lower body assist Assist for lower body dressing: Minimal Assistance - Patient > 75%     Toileting Toileting    Toileting assist Assist for toileting: Minimal Assistance - Patient > 75%     Transfers Chair/bed transfer  Transfers assist     Chair/bed transfer assist level: Minimal Assistance - Patient > 75%     Locomotion Ambulation   Ambulation assist      Assist level: Contact Guard/Touching assist Assistive device: Walker-rolling Max distance: 140f   Walk 10 feet activity   Assist     Assist level: Contact Guard/Touching assist Assistive device: Walker-rolling   Walk 50 feet activity   Assist    Assist level: Contact Guard/Touching assist Assistive device: Walker-rolling    Walk 150 feet activity  Assist    Assist level: Contact Guard/Touching assist Assistive device: Walker-rolling    Walk 10 feet on uneven surface  activity   Assist Walk 10 feet on uneven surfaces activity did not occur: Safety/medical concerns         Wheelchair     Assist Is the patient using a wheelchair?: No Type of Wheelchair: Manual    Wheelchair assist level: Supervision/Verbal cueing Max wheelchair distance: 26f    Wheelchair 50 feet with 2 turns activity    Assist        Assist Level: Supervision/Verbal cueing   Wheelchair 150 feet activity     Assist  Wheelchair 150 feet activity did not occur: Safety/medical concerns       Blood pressure (!) 99/59, pulse 69, temperature 98 F (36.7 C), temperature source Oral, resp. rate 18, height 5' 3"  (1.6 m), weight 83.3 kg, SpO2 96 %.    Medical Problem List and Plan: 1.  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 Keep dressing on chest clean and dry.              -ELOS/Goals: 5-7 days modI             Messaged team to assess discharge recs.   HFU scheduled 2.  Antithrombotics: -DVT/anticoagulation:  Pharmaceutical: Lovenox             -antiplatelet therapy: DAPT X 3 weeks followed by plavix alone.  3. Chronic pain with dystonia/Pain Management: Continue percocet prn --has insomnia and sleeps in recline. Gets up once a night for pain pill.    -add back baclofen which was helping -discussed risks and benefits of outpatient Botox.  4. Insomnia: increase magnesium to 5061mHS.  5. Neuropsych: This patient is capable of making decisions on her own behalf. 6. Skin/Wound Care: Routine pressure relief measures.  7. Fluids/Electrolytes/Nutrition: Monitor I/O.  8. HTN: Monitor BP TID- SBP goal <140             --discontinue Avapro 9.T2DM: Hgb A1C-8.0. Uncontrolled on Jardiance, Levemir 80U, increase metformin to 100052mID, Ozempic weekly on Wed PTA.--->patient will have husband bring in ozempic to resume regimen.  --Continue levemir 60 units. Resume Metformin at 500 mg TID as BS poorly controlled.  --monitor BS ac/hs. Use SSI for elevated BS and titrate insulin and metformin to home dose.   10. Seizure d/o: Stable and controlled on Keppra 750 mg bid  11. Dyslipidemia: will provide list of foods and supplements that can help lower LDL. See #13.  12. Hypothyroid: Managed on synthroid for supplement. 13. 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  14. Obesity BMI 32.53:provide dietary education  LOS: 2 days A FACE TO FACE EVALUATION WAS PERFORMED  Kymari Nuon P Sharry Beining 11/17/2021, 11:20 AM

## 2021-11-17 NOTE — Progress Notes (Addendum)
Occupational Therapy Session Note  Patient Details  Name: Connie Campbell MRN: [id number] Date of Birth: 1951-02-13  Today's Date: 11/17/2021 OT Individual Time: 2409-7353 OT Individual Time Calculation (min): 70 min    Short Term Goals: Week 1:  OT Short Term Goal 1 (Week 1): STGs=LTGS due to ELOS  Skilled Therapeutic Interventions/Progress Updates:    Pt sitting up in recliner, reporting she feels better since she slept more last night with less pain.  Pt completed all sinkside dressing and bathing at sit<>stand level with distant supervision.  Pt also retrieved her ADL items with distant supervision at [place] level from closet.  Pt completed oral hygiene standing at sink with distant supervision.  After all self care completed, pt ambulated in hallway to ortho gym ~50 feet using RW with close supervision needing occasional verbal cues to attend to obstacles on the left.  Pt participated in dynamic standing activity with functional reach component without use of AD requiring close supervision (grasping bean bags from various heights and directions and tossing in small container on floor in front.  Pt then participated in static standing balance with righting reaction challenge using balance board with audio feedback left and right tip.  Pt initially needing min assist to maintain balance, then fading to CGA with increased reaction speed righting to center although unable to sustain for more then ~5 seconds.  Pt ambulated back to room, needing supervision again to attend to left side of environment due to pt walking very closely to left wall and intermittently bumping into left side without cues.  Returned to recliner with supervision.  Call bell in reach, seat alarm on.   Therapy Documentation Precautions:  Precautions Precautions: Fall Restrictions Weight Bearing Restrictions: No   Therapy/Group: Individual Therapy  Ezekiel Slocumb 11/17/2021, 9:21 AM

## 2021-11-17 NOTE — Discharge Instructions (Addendum)
Inpatient Rehab Discharge Instructions  Danesha Kirchoff Discharge date and time: No discharge date for patient encounter.   Activities/Precautions/ Functional Status: Activity: no lifting, driving, or strenuous exercise till cleared by MD Diet: cardiac diet and diabetic diet Wound Care: keep wound clean and dry   Functional status:  ___ No restrictions     ___ Walk up steps independently ___ 24/7 supervision/assistance   ___ Walk up steps with assistance _X__ Intermittent supervision/assistance  ___ Bathe/dress independently ___ Walk with walker     ___ Bathe/dress with assistance ___ Walk Independently    ___ Shower independently ___ Walk with assistance    _X__ Shower with assistance _X__ No alcohol     ___ Return to work/school ________   Special Instructions:  [facility] to provide PT and OT-- [phone]    STROKE/TIA DISCHARGE INSTRUCTIONS SMOKING Cigarette smoking nearly doubles your risk of having a stroke & is the single most alterable risk factor  If you smoke or have smoked in the last 12 months, you are advised to quit smoking for your health. Most of the excess cardiovascular risk related to smoking disappears within a year of stopping. Ask you doctor about anti-smoking medications [facility] Quit Line: 1-800-QUIT NOW Free Smoking Cessation Classes [phone]  CHOLESTEROL Know your levels; limit fat & cholesterol in your diet  Lipid Panel     Component Value Date/Time   CHOL 162 11/10/2021 0429   TRIG 166 (H) 11/10/2021 0429   HDL 39 (L) 11/10/2021 0429   CHOLHDL 4.2 11/10/2021 0429   VLDL 33 11/10/2021 0429   LDLCALC 90 11/10/2021 0429     Many patients benefit from treatment even if their cholesterol is at goal. Goal: Total Cholesterol (CHOL) less than 160 Goal:  Triglycerides (TRIG) less than 150 Goal:  HDL greater than 40 Goal:  LDL (LDLCALC) less than 100   BLOOD PRESSURE American Stroke Association blood pressure target is less that 120/80  mm/Hg  Your discharge blood pressure is:  BP: 127/67 Monitor your blood pressure Limit your salt and alcohol intake Many individuals will require more than one medication for high blood pressure  DIABETES (A1c is a blood sugar average for last 3 months) Goal HGBA1c is under 7% (HBGA1c is blood sugar average for last 3 months)  Diabetes:     Lab Results  Component Value Date   HGBA1C 8.0 (H) 11/10/2021    Your HGBA1c can be lowered with medications, healthy diet, and exercise. Check your blood sugar as directed by your physician Call your physician if you experience unexplained or low blood sugars.  PHYSICAL ACTIVITY/REHABILITATION Goal is 30 minutes at least 4 days per week  Activity: No driving, Therapies: see above Return to work: N/A Activity decreases your risk of heart attack and stroke and makes your heart stronger.  It helps control your weight and blood pressure; helps you relax and can improve your mood. Participate in a regular exercise program. Talk with your doctor about the best form of exercise for you (dancing, walking, swimming, cycling).  DIET/WEIGHT Goal is to maintain a healthy weight  Your discharge diet is:  Diet Order             Diet heart healthy/carb modified Room service appropriate? Yes; Fluid consistency: Thin  Diet effective ____                   liquids Your height is:  Height: 5' 3"  (160 cm) Your current weight is: Weight:  83.3 kg Your Body Mass Index (BMI) is:  BMI (Calculated): 32.54 Following the type of diet specifically designed for you will help prevent another stroke. Your goal weight range is:   Your goal Body Mass Index (BMI) is 19-24. Healthy food habits can help reduce 3 risk factors for stroke:  High cholesterol, hypertension, and excess weight.  RESOURCES Stroke/Support Group:  Call [phone]   STROKE EDUCATION PROVIDED/REVIEWED AND GIVEN TO PATIENT Stroke warning signs and symptoms How to activate emergency medical system  (call 911). Medications prescribed at discharge. Need for follow-up after discharge. Personal risk factors for stroke. Pneumonia vaccine given:  Flu vaccine given:  My questions have been answered, the writing is legible, and I understand these instructions.  I will adhere to these goals & educational materials that have been provided to me after my discharge from the hospital.     My questions have been answered and I understand these instructions. I will adhere to these goals and the provided educational materials after my discharge from the hospital.  Patient/Caregiver Signature _______________________________ Date __________  Clinician Signature _______________________________________ Date __________  Please bring this form and your medication list with you to all your follow-up doctor's appointments.

## 2021-11-17 NOTE — Progress Notes (Signed)
Physical Therapy Session Note  Patient Details  Name: Connie Campbell MRN: [id number] Date of Birth: 1951-09-07  Today's Date: 11/17/2021 PT Individual Time: 0900-1000 PT Individual Time Calculation (min): 60 min   Short Term Goals: Week 1:  PT Short Term Goal 1 (Week 1): STG = LTG due to ELOS  Skilled Therapeutic Interventions/Progress Updates:      Pt sitting in recliner to start session. Pt reports 3/10 bilateral chronic hip pain that she recently received pain medications for. Rest breaks and mobility provided for pain management. Discussed DC planning, DME rec's, f/u therapy recommendations, etc as pt expected to have shorter LOS with anticipated DC date of Tuesday, 2/14. Recommending RW and HHPT and family coming over weekend for family education/training.   Sit<>stand to RW with CGA and completed ambulatory transfer with supervision and RW within her room. Transported for energy conservation and time to ortho rehab gym. Completed car transfer with car height simulating her Lucianne Lei - CGA for safety and min instructional cues for technique. Ambulated up/down 79f ramp twice with RW and CGA as pt has ramped entrance into home. Some slight increase in unsteadiness with descent > ascent but no formal LOB noted - VC for safety awareness.  Transported to main rehab gym to focus remainder of session on standing balance. Completed BIODEX activities with CGA for stepping on/off machine. Completed limits of stability training and visual weight bearing percentages. Pt with the following weight bearing percentages in standing without cues: R: 40% bias L: 60% bias Anterior: 40% bias Posterior: 60% bias  Completed standing with vs without arm support on BIODEX with feet hip width apart for both activities. Required minA for no UE support and CGA/supervision for UE support. Pt with improved ability to initiate ankle/hip strategies for these activities as they progressed.   Completed standing UE exercises  with 2lb dowel rod - 1x10 shldr press + 1x10 chest press to challenge standing balance, reaching outside BOS - completed with CGA for safety.   RN entering during session for morning medications, given during seated rest breaks.   Pt ambulated with RW and supervision ~1734fback to her room - VC for increasing gait speed and stride length, improving forward gaze.   Pt concluded session seated in recliner with chair alarm on, all needs in reach.   Therapy Documentation Precautions:  Precautions Precautions: Fall Restrictions Weight Bearing Restrictions: No General:    Therapy/Group: Individual Therapy  ChAlger Simons/07/2022, 9:40 AM

## 2021-11-17 NOTE — IPOC Note (Signed)
Overall Plan of Care [facility]) Patient Details Name: Connie Campbell MRN: [id number] DOB: [birth year]  Admitting Diagnosis: Ischemic stroke of frontal lobe [facility])  Hospital Problems: Principal Problem:   Ischemic stroke of frontal lobe ([place])     Functional Problem List: Nursing Medication Management, Safety, Endurance, Bowel, Pain  PT Balance, Endurance, Motor, Safety, Sensory, Skin Integrity  OT Balance, Cognition, Endurance, Motor, Pain, Safety, Sensory  SLP    TR         Basic ADLs: OT Grooming, Bathing, Toileting, Dressing     Advanced  ADLs: OT       Transfers: PT Bed Mobility, Bed to Chair, Teacher, early years/pre, Tub/Shower     Locomotion: PT Ambulation, Emergency planning/management officer, Stairs     Additional Impairments: OT    SLP None      TR      Anticipated Outcomes Item Anticipated Outcome  Self Feeding independent  Swallowing      Basic self-care  mod I-sup  Toileting  mod I   Bathroom Transfers mod I  Bowel/Bladder  manage bowel w mod I  Transfers  mod I with LRAD  Locomotion  mod I with LRAD  Communication     Cognition     Pain  pain at or below level 4 with prns  Safety/Judgment  maintain safety with cues   Therapy Plan: PT Intensity: Minimum of 1-2 x/day ,45 to 90 minutes PT Frequency: 5 out of 7 days PT Duration Estimated Length of Stay: 5-7 days OT Intensity: Minimum of 1-2 x/day, 45 to 90 minutes OT Frequency: 5 out of 7 days OT Duration/Estimated Length of Stay: 5-7 days     Due to the current state of emergency, patients may not be receiving their 3-hours of Medicare-mandated therapy.   Team Interventions: Nursing Interventions Disease Management/Prevention, Medication Management, Discharge Planning, Pain Management, Bowel Management, Patient/Family Education  PT interventions Ambulation/gait training, Training and development officer, Community reintegration, Discharge planning, Disease management/prevention, DME/adaptive equipment instruction,  Functional mobility training, Neuromuscular re-education, Pain management, Patient/family education, Psychosocial support, Splinting/orthotics, Stair training, Therapeutic Activities, Therapeutic Exercise, UE/LE Strength taining/ROM, UE/LE Coordination activities, Wheelchair propulsion/positioning  OT Interventions Balance/vestibular training, Discharge planning, Pain management, Self Care/advanced ADL retraining, Therapeutic Activities, UE/LE Coordination activities, Cognitive remediation/compensation, Disease mangement/prevention, Functional mobility training, Patient/family education, Therapeutic Exercise, Visual/perceptual remediation/compensation, DME/adaptive equipment instruction, Neuromuscular re-education, Psychosocial support, Community reintegration, UE/LE Strength taining/ROM  SLP Interventions    TR Interventions    SW/CM Interventions Discharge Planning, Psychosocial Support, Patient/Family Education   Barriers to Discharge MD  Medical stability  Nursing Decreased caregiver support 1 level ramped entry with sister in law and daughter; was caregiver for spouse PTA  PT Decreased caregiver support, Insurance for SNF coverage, Lack of/limited family support    OT Decreased caregiver support, Home environment access/layout Pts dtr and son live with her but work from home.  Husband is disabled . Pt has sunken living room  SLP      SW Decreased caregiver support, Insurance for SNF coverage     Team Discharge Planning: Destination: PT-Home ,OT- Home , SLP-Home Projected Follow-up: PT-Home health PT, OT-  Home health OT, SLP-None Projected Equipment Needs: PT-To be determined, OT- To be determined, SLP-None recommended by SLP Equipment Details: PT- , OT-has a BSC and shower chair Patient/family involved in discharge planning: PT- Patient,  OT-Patient, SLP-Patient, Family member/caregiver  MD ELOS: 6 days Medical Rehab Prognosis:  Excellent Assessment: Connie Campbell is a 71 year old  woman admitted to CIR with functional  deficits secondary to frontal lobe CVA. Course complicated by statin-induced myalgia. Medications are being managed, and labs and vitals are being monitored regularly.     See Team Conference Notes for weekly updates to the plan of care

## 2021-11-17 NOTE — Progress Notes (Signed)
Patient ID: Connie Campbell, female   DOB: 13-Oct-1950, 71 y.o.   MRN: [id number]  Team feels pt will meet her goals by 2/14 and MD feels she will be medically ready for discharge then also. DC set for Tuesday 2/14. Will order rolling walker and set up Home health through [facility] her preference.

## 2021-11-18 LAB — GLUCOSE, CAPILLARY
Glucose-Capillary: 81 mg/dL (ref 70–99)
Glucose-Capillary: 101 mg/dL — ABNORMAL HIGH (ref 70–99)
Glucose-Capillary: 99 mg/dL (ref 70–99)
Glucose-Capillary: 121 mg/dL — ABNORMAL HIGH (ref 70–99)

## 2021-11-18 NOTE — Progress Notes (Signed)
Physical Therapy Session Note  Patient Details  Name: Connie Campbell MRN: [id number] Date of Birth: 1951/03/24  Today's Date: 11/18/2021 PT Individual Time: 1500-1526 PT Individual Time Calculation (min): 26 min   Short Term Goals: Week 1:  PT Short Term Goal 1 (Week 1): STG = LTG due to ELOS  Skilled Therapeutic Interventions/Progress Updates:   Received pt sitting in recliner with husband lying in bed. Pt agreeable to PT treatment and denied any pain during session. Session with emphasis on functional mobility, generalized strengthening and endurance, and gait training. Sit<>stands with RW and supervision throughout session. Pt ambulated >21f with RW and CGA fading to close supervision - cues for upright posture/gaze and to increase cadence. Returned to room and took seated rest break, then ambulated additional 1070fwithout AD and CGA - cues to relax UE and to facilitate arm swing. Concluded session with pt sitting in recliner, needs within reach, and seatbelt alarm on.   Therapy Documentation Precautions:  Precautions Precautions: Fall Restrictions Weight Bearing Restrictions: No  Therapy/Group: Individual Therapy AnAlfonse AlpersT, DPT   11/18/2021, 7:40 AM

## 2021-11-18 NOTE — Progress Notes (Signed)
Occupational Therapy Session Note  Patient Details  Name: Connie Campbell MRN: [id number] Date of Birth: Feb 15, 1951  Today's Date: 11/18/2021 OT Individual Time: 5974-7185 OT Individual Time Calculation (min): 28 min    Short Term Goals: Week 1:  OT Short Term Goal 1 (Week 1): STGs=LTGS due to ELOS  Skilled Therapeutic Interventions/Progress Updates:    Pt received seated in recliner with husband present, reports ongoing chronic back pain but is premedicated, agreeable to therapy. Session focus on self-care retraining, activity tolerance, transfer retraining, CVA education in prep for improved ADL/IADL/func mobility performance + decreased caregiver burden. Ambulatory toilet transfer with close S and RW, completed toileting tasks post continent void of bladder with distant S and able to complete standing hygiene at sink with distant S, as well.  Ambulated to and from gym with RW and close S. Participated in 1 round of corn hole without RW, close S for balance throughout and pt able to retrieve bags from various planes/heights. No overt LOB. Discussed modifiable lifestyle factors to decrease risk of recurrent CVA. Pt reports having blood pressure machine at home, but did not use often. Family also interested in where to purchase hospital tray for home use; discussed various options - husband would like to see if SW can order through [employer] - will update primary team.  Pt left seated in recliner with husband present, safety belt alarm engaged, call bell in reach, and all immediate needs met.    Therapy Documentation Precautions:  Precautions Precautions: Fall Restrictions Weight Bearing Restrictions: No  Pain: see session note ADL: See Care Tool for more details.   Therapy/Group: Individual Therapy  Volanda Napoleon MS, OTR/L  11/18/2021, 6:54 AM

## 2021-11-18 NOTE — Progress Notes (Signed)
Physical Therapy Session Note  Patient Details  Name: Connie Campbell MRN: [id number] Date of Birth: 07/27/1951  Today's Date: 11/18/2021 PT Individual Time: 1001-1057 and 1301-1330 PT Individual Time Calculation (min): 56 min and 29 min  Short Term Goals: Week 1:  PT Short Term Goal 1 (Week 1): STG = LTG due to ELOS  Skilled Therapeutic Interventions/Progress Updates:  Session 1: Patient supine in bed on entrance to room. Patient alert and agreeable to PT session.   Patient with no pain complaint throughout session. Requests to toilet, "wash up", and change clothes.   Therapeutic Activity: Bed Mobility: Patient performed supine --> sit with supervision using bed rails after assist of pulling back bedsheets. Transfers: Patient performed sit<>stand and stand pivot transfers throughout session using RW with CGA initially and improving to supervision with time spent in standing. Provided verbal cues for technique including pushing from armrests, and reaching back to armrests to slow descent.  Toilet transfer performed with close supervision. Pt able to doff all clothing to bathe with washcloth and soap, then don clean clothing to start her day - all under supervision. Ambulates to sink to wash partials and brush teeth, then comb hair. All performed in standing with supervision.    Gait Training:  Patient ambulated 120 ft using RW with CGA/ supervision. Demonstrated slow pace with low step height and mild lateral sway. Light pressure into walker with flexed posture. Pt relates that she did not use RW at home prior, but she did furniture walk.    Patient seated upright  in recliner at end of session with brakes locked, belt alarm set, and all needs within reach.    Session 2: Patient seated wth BLE elevated in recliner on entrance to room. Patient alert and agreeable to PT session. Husband asleep in pt's bed.   Husband requests pt's pain medication for her chronic BLE pain from fibromyalgia  and Bil hips.   Therapeutic Activity:  Transfers: Patient performed sit<>stand and stand pivot transfers throughout session with close supervision. Pt leans over walker to perform rise to stand despite vc to push from seat armrests.    Gait Training:  Patient ambulated 96' x2 using RW with CGA, then close supervision. Pt also guided in amb 20' x1 with no AD. Pt taking smaller steps d/t decreased confidence, but only small variation in quality of gait from using RW. Demonstrated very slow pace, flexed posture, increased lateral sway. Provided vc/ tc for upright posture, level gaze. Recommended to continue to work on ambulation without AD in order to return to PLOF.   Patient seated with BLE elevated  in recliner at end of session with brakes locked, belt alarm set, and all needs within reach.   Therapy Documentation Precautions:  Precautions Precautions: Fall Restrictions Weight Bearing Restrictions: No General:   Vital Signs: Therapy Vitals Temp: 98.6 F (37 C) Temp Source: Oral Pulse Rate: 73 Resp: 16 BP: 112/62 Patient Position (if appropriate): Sitting Oxygen Therapy SpO2: 95 % O2 Device: Room Air Pain: Pt with c/o 7/10 pain in BLE and requesting Percocet from RN.   Therapy/Group: Individual Therapy  Alger Simons PT, DPT 11/18/2021, 8:25 AM

## 2021-11-18 NOTE — Progress Notes (Signed)
Occupational Therapy Session Note  Patient Details  Name: Connie Campbell MRN: [id number] Date of Birth: Sep 08, 1951  Today's Date: 11/19/2021 OT Individual Time: 9935-7017 OT Individual Time Calculation (min): 55 min   Short Term Goals: Week 1:  OT Short Term Goal 1 (Week 1): STGs=LTGS due to ELOS  Skilled Therapeutic Interventions/Progress Updates:    Pt greeted in the recliner, premedicated for chronic pain. She wanted to participate in bathing/dressing tasks at the sink today. CGA for ambulatory transfer to the w/c parked in front of the sink without AD. Pt then bathed while standing ~90% of the time, supervision for balance + min cues for sequencing/short term memory recall. She also completed denture care while standing. Pt sat to wash her feet and thread LEs into LB garments. Setup for UB dressing. Pt combed and pinned up hair on her own while seated. Note that throughout tasks pt mindful of using her affected Rt hand at dominant level. She ambulated without AD and CGA to the therapy apartment, vcs for increasing speed and taking larger steps. She sat on the couch for a bit and we engaged in conversation about d/c plans. She already owns a 3:1 and TTB, will share with husband. She will definitely need a walker. Between daughter and son in law, pt will have 24/7 supervision and support. Pt ambulated back to the room in the same manner as written above and transferred to the recliner. She remained sitting up, left with all needs within reach. Tx focus placed on ADL retraining, functional mobility, NMR, and d/c planning.   Therapy Documentation Precautions:  Precautions Precautions: Fall Restrictions Weight Bearing Restrictions: No ADL: ADL Grooming: Contact guard Where Assessed-Grooming: Standing at sink Lower Body Dressing: Minimal assistance Where Assessed-Lower Body Dressing: Wheelchair Toileting: Minimal assistance Where Assessed-Toileting: Bedside Commode Toilet Transfer: Minimal  assistance Toilet Transfer Method: Stand pivot Toilet Transfer Equipment: Bedside commode   Therapy/Group: Individual Therapy  Baylee Mccorkel A Romell Wolden 11/19/2021, 12:51 PM

## 2021-11-19 LAB — GLUCOSE, CAPILLARY
Glucose-Capillary: 62 mg/dL — ABNORMAL LOW (ref 70–99)
Glucose-Capillary: 142 mg/dL — ABNORMAL HIGH (ref 70–99)
Glucose-Capillary: 137 mg/dL — ABNORMAL HIGH (ref 70–99)
Glucose-Capillary: 196 mg/dL — ABNORMAL HIGH (ref 70–99)

## 2021-11-19 NOTE — Progress Notes (Addendum)
Physical Therapy Session Note  Patient Details  Name: Connie Campbell MRN: [id number] Date of Birth: 1951/07/05  Today's Date: 11/19/2021 PT Individual Time: 1105-1205 PT Individual Time Calculation (min): 60 min   Short Term Goals: Week 1:  PT Short Term Goal 1 (Week 1): STG = LTG due to ELOS  Skilled Therapeutic Interventions/Progress Updates:     Patient in recliner with her husband and daughter in the room upon PT arrival. Patient alert and agreeable to PT session. Patient denied pain during session.  Spent increased time discussing home set-up, d/c planning,  fall risk/prevention, home modifications to prevent falls, and activation of emergency services in the event of a fall during session. SIL measured hallway, doorways, and the bathroom, RW will fit through all doorways and the hallway, will need to problem solve getting to the shower as the toilet creates a narrow path, Hor[facility] Latino OT informed. Discussed home entry, patient and family reports that the patient will need to ambulated over ~4 ft of level sidewalk to the ramped entry into their home. Patient's daughter and SIL have moved into the den of the patient's home, patient will not need to access the den with 4 steps down and has no other steps that she will need to use at this time. Plan for transportation home in a elevated van on day of d/c. Adjusted schedule for family education with OT and PT tomorrow between 8:45am to 12:00pm with patient's husband, daughter, and SIL.   Therapeutic Activity: Transfers: Patient performed sit to/from stand x2 with supervision for safety using RW. Demonstrated good technique without cues. Patient performed a simulated elevated van height car transfer with supervision using RW. Provided cues for safe technique for backing up to the seat and scooting back in the seat once perched on the edge due to elevated seat height.  Gait Training:  Patient ambulated 150 feet x2 using RW with supervision. Ambulated  with decreased gait speed, decreased step length and height, decreased visual scanning/head turns, mild forward trunk lean, and intermittent downward head gaze. Provided verbal cues for erect posture, increased visual scanning of her environment, and increased gait speed for improved balance with gait. Patient reports intermittent dizziness with R head turns for many years following cervical fusion, denied dizziness with increased visual scanning during gait training today. Patient ambulated up/down a 10 ft ramp with supervision using RW. Provided min cues for technique and use of AD.  Patient's daughter participated in hands-on training. Her daughter observed safe guarding technique and performed safe guarding technique following PT demonstration with all mobility performed.  Patient in recliner with family in the room at end of session with breaks locked, seat belt alarm set, and all needs within reach.   Therapy Documentation Precautions:  Precautions Precautions: Fall Restrictions Weight Bearing Restrictions: No    Therapy/Group: Individual Therapy  Kyrianna Barletta L Emiya Loomer PT, DPT  11/19/2021, 12:24 PM

## 2021-11-19 NOTE — Progress Notes (Signed)
PROGRESS NOTE   Subjective/Complaints:  Already had therapy today x2- resting from it, when she got cleaned up-  Pain doing better-    ROS: +cervical dystonia-  Pt denies SOB, abd pain, CP, N/V/C/D, and vision changes   Objective:   No results found. No results for input(s): WBC, HGB, HCT, PLT in the last 72 hours.  No results for input(s): NA, K, CL, CO2, GLUCOSE, BUN, CREATININE, CALCIUM in the last 72 hours.   Intake/Output Summary (Last 24 hours) at 11/19/2021 1457 Last data filed at 11/19/2021 1305 Gross per 24 hour  Intake 956 ml  Output --  Net 956 ml        Physical Exam: Vital Signs Blood pressure 130/72, pulse 78, temperature 97.7 F (36.5 C), temperature source Oral, resp. rate 18, height 5' 3"  (1.6 m), weight 83.3 kg, SpO2 100 %.    General: awake, alert, appropriate,sitting up in w/c at bedside;  NAD HENT: conjugate gaze; oropharynx moist- cervical dystonia noted CV: regular rate; no JVD Pulmonary: CTA B/L; no W/R/R- good air movement GI: soft, NT, ND, (+)BS Psychiatric: appropriate Neurological: alert Neurological:     Mental Status: She is alert and oriented to person, place, and time. +cervical dystonia    Comments: Decreased hearing. Right facial weakness without mild dysarthria. Right sided weakness with sensory deficits. Able to follow simple 1 and 2 step simple motor commands.      Assessment/Plan: 1. Functional deficits which require 3+ hours per day of interdisciplinary therapy in a comprehensive inpatient rehab setting. Physiatrist is providing close team supervision and 24 hour management of active medical problems listed below. Physiatrist and rehab team continue to assess barriers to discharge/monitor patient progress toward functional and medical goals  Care Tool:  Bathing    Body parts bathed by patient: Right arm, Left arm, Chest, Abdomen, Front perineal area, Buttocks,  Right upper leg, Left upper leg, Right lower leg, Left lower leg, Face         Bathing assist Assist Level: Supervision/Verbal cueing     Upper Body Dressing/Undressing Upper body dressing   What is the patient wearing?: Pull over shirt    Upper body assist Assist Level: Set up assist    Lower Body Dressing/Undressing Lower body dressing      What is the patient wearing?: Underwear/pull up, Pants     Lower body assist Assist for lower body dressing: Supervision/Verbal cueing     Toileting Toileting    Toileting assist Assist for toileting: Minimal Assistance - Patient > 75%     Transfers Chair/bed transfer  Transfers assist     Chair/bed transfer assist level: Supervision/Verbal cueing Chair/bed transfer assistive device: Programmer, multimedia   Ambulation assist      Assist level: Supervision/Verbal cueing Assistive device: Walker-rolling Max distance: 150 ft   Walk 10 feet activity   Assist     Assist level: Supervision/Verbal cueing Assistive device: Walker-rolling   Walk 50 feet activity   Assist    Assist level: Supervision/Verbal cueing Assistive device: Walker-rolling    Walk 150 feet activity   Assist    Assist level: Supervision/Verbal cueing Assistive device: Walker-rolling  Walk 10 feet on uneven surface  activity   Assist Walk 10 feet on uneven surfaces activity did not occur: Safety/medical concerns   Assist level: Supervision/Verbal cueing Assistive device: Walker-rolling   Wheelchair     Assist Is the patient using a wheelchair?: No Type of Wheelchair: Manual    Wheelchair assist level: Supervision/Verbal cueing Max wheelchair distance: 34f    Wheelchair 50 feet with 2 turns activity    Assist        Assist Level: Supervision/Verbal cueing   Wheelchair 150 feet activity     Assist  Wheelchair 150 feet activity did not occur: Safety/medical concerns       Blood pressure  130/72, pulse 78, temperature 97.7 F (36.5 C), temperature source Oral, resp. rate 18, height 5' 3"  (1.6 m), weight 83.3 kg, SpO2 100 %.    Medical Problem List and Plan: 1. 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 Keep dressing on chest clean and dry.              -ELOS/Goals: 5-7 days modI             Messaged team to assess discharge recs.   HFU scheduled  -Continue CIR- PT, OT and SLP 2.  Antithrombotics: -DVT/anticoagulation:  Pharmaceutical: Lovenox             -antiplatelet therapy: DAPT X 3 weeks followed by plavix alone.  3. Chronic pain with dystonia/Pain Management: Continue percocet prn --has insomnia and sleeps in recline. Gets up once a night for pain pill.    -add back baclofen which was helping -discussed risks and benefits of outpatient Botox.  2/12- pain controlled "adequately"- con't regimen 4. Insomnia: increase magnesium to 5012mHS.  5. Neuropsych: This patient is capable of making decisions on her own behalf. 6. Skin/Wound Care: Routine pressure relief measures.  7. Fluids/Electrolytes/Nutrition: Monitor I/O.  8. HTN: Monitor BP TID- SBP goal <140             --discontinue Avapro 9.T2DM: Hgb A1C-8.0. Uncontrolled on Jardiance, Levemir 80U, increase metformin to 100068mID, Ozempic weekly on Wed PTA.--->patient will have husband bring in ozempic to resume regimen.  --Continue levemir 60 units. Resume Metformin at 500 mg TID as BS poorly controlled.  --monitor BS ac/hs. Use SSI for elevated BS and titrate insulin and metformin to home dose. 2/12- CBGs 62- 142- 62 was early AM today- didn't eat snack- will order   10. Seizure d/o: Stable and controlled on Keppra 750 mg bid  11. Dyslipidemia: will provide list of foods and supplements that can help lower LDL. See #13.  12. Hypothyroid: Managed on synthroid for supplement. 13. 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  14. Obesity BMI 32.53:provide dietary education  LOS: 4 days A FACE TO FACE EVALUATION WAS PERFORMED  Dub Maclellan 11/19/2021, 2:57 PM

## 2021-11-19 NOTE — Progress Notes (Signed)
Physical Therapy Session Note  Patient Details  Name: Teasha Murrillo MRN: [id number] Date of Birth: Jul 28, 1951  Today's Date: 11/19/2021 PT Individual Time: 0800-0858 PT Individual Time Calculation (min): 58 min   Short Term Goals: Week 1:  PT Short Term Goal 1 (Week 1): STG = LTG due to ELOS  Skilled Therapeutic Interventions/Progress Updates:    Pt received in recliner and agreeable to therapy.  RN present for meds pass. No complaint of pain. premedicated. Rest and positioning provided as needed. Pt transported to therapy gym for time management and energy conservation. Pt directed in dynamic balance activities at stairs to improved pt stated goal of balance and confidence with single leg stance and diminishing UE support: -step taps with BUE, UUE and no UE support x 10 each, CGA to close supervision -step ups with UUE support leading with each foot x 6. Discussed stair technique leading with strong foot for safety when ascending and weak foot when descending. -double step taps with UUE support, incr difficulty when lifting CL leg to UE support. Pt fatigues quickly, but demoes good coordination. -runners step up 2 x 10 BIL for incr strength, UUE support Pt directed in gait training, x 30 ft with RW, x 30 ft, 60 ft, 200 ft back to room without RW. Pt demoes mild flexed posture (unchanged with/without AD), low step clearance, decr arm swing. Demoes RUE flexion synergy at times when challenged with speed changes and head movement. Multimodal cueing for R arm swing, with mod improvement at end of longer bout. Pt returned to recliner after session and was left with all needs in reach and alarm active.   Therapy Documentation Precautions:  Precautions Precautions: Fall Restrictions Weight Bearing Restrictions: No     Therapy/Group: Individual Therapy  Mickel Fuchs 11/19/2021, 8:06 AM

## 2021-11-19 NOTE — Progress Notes (Addendum)
Hypoglycemic Event  CBG: 62  Treatment: 4 oz juice/soda  Symptoms: None  Follow-up CBG: [id number] CBG Result:190  Possible Reasons for Event: Inadequate meal intake  Comments/MD notified: followed protocol      Rosalin Buster E Ander Gaster

## 2021-11-20 DIAGNOSIS — E119 Type 2 diabetes mellitus without complications: Secondary | ICD-10-CM

## 2021-11-20 DIAGNOSIS — G243 Spasmodic torticollis: Secondary | ICD-10-CM

## 2021-11-20 LAB — CBC
HCT: 40.5 % (ref 36.0–46.0)
Hemoglobin: 13.6 g/dL (ref 12.0–15.0)
MCH: 28.9 pg (ref 26.0–34.0)
MCHC: 33.6 g/dL (ref 30.0–36.0)
MCV: 86.2 fL (ref 80.0–100.0)
Platelets: 229 10*3/uL (ref 150–400)
RBC: 4.7 MIL/uL (ref 3.87–5.11)
RDW: 13.3 % (ref 11.5–15.5)
WBC: 6.6 10*3/uL (ref 4.0–10.5)
nRBC: 0 % (ref 0.0–0.2)

## 2021-11-20 LAB — GLUCOSE, CAPILLARY
Glucose-Capillary: 73 mg/dL (ref 70–99)
Glucose-Capillary: 125 mg/dL — ABNORMAL HIGH (ref 70–99)
Glucose-Capillary: 150 mg/dL — ABNORMAL HIGH (ref 70–99)

## 2021-11-20 MED ORDER — METFORMIN HCL 1000 MG PO TABS: 1000.0000 mg | ORAL_TABLET | Freq: Two times a day (BID) | ORAL | 0 refills | Status: AC

## 2021-11-20 MED ORDER — CLOPIDOGREL BISULFATE 75 MG PO TABS: 75.0000 mg | ORAL_TABLET | Freq: Every day | ORAL | 0 refills | Status: DC

## 2021-11-20 MED ORDER — CARISOPRODOL 350 MG PO TABS: 350.0000 mg | ORAL_TABLET | Freq: Every day | ORAL | 0 refills | Status: DC | PRN

## 2021-11-20 MED ORDER — CARISOPRODOL 350 MG PO TABS
350.0000 mg | ORAL_TABLET | Freq: Every day | ORAL | Status: DC | PRN
  Filled 2021-11-20: qty 1

## 2021-11-20 MED ORDER — GUAIFENESIN-DM 100-10 MG/5ML PO SYRP: 5.0000 mL | ORAL_SOLUTION | Freq: Four times a day (QID) | ORAL | 0 refills | Status: DC | PRN

## 2021-11-20 MED ORDER — ACETAMINOPHEN 325 MG PO TABS: 325.0000 mg | ORAL_TABLET | ORAL | Status: DC | PRN

## 2021-11-20 MED ORDER — POLYVINYL ALCOHOL 1.4 % OP SOLN: 1.0000 [drp] | OPHTHALMIC | 0 refills | Status: AC | PRN

## 2021-11-20 MED ORDER — ASPIRIN 81 MG PO TBEC: 81.0000 mg | DELAYED_RELEASE_TABLET | Freq: Every day | ORAL | 0 refills | Status: DC

## 2021-11-20 MED ORDER — LEVEMIR FLEXTOUCH 100 UNIT/ML ~~LOC~~ SOPN: 60.0000 [IU] | PEN_INJECTOR | Freq: Every day | SUBCUTANEOUS | 11 refills | Status: DC

## 2021-11-20 MED ORDER — TRAZODONE HCL 50 MG PO TABS: 25.0000 mg | ORAL_TABLET | Freq: Every evening | ORAL | 0 refills | Status: DC | PRN

## 2021-11-20 MED ORDER — MAGNESIUM GLUCONATE 500 MG PO TABS: 500.0000 mg | ORAL_TABLET | Freq: Every day | ORAL | 0 refills | Status: DC

## 2021-11-20 MED ORDER — OZEMPIC (0.25 OR 0.5 MG/DOSE) 2 MG/1.5ML ~~LOC~~ SOPN
0.5000 mg | PEN_INJECTOR | SUBCUTANEOUS | 0 refills | Status: AC
Start: 2021-11-20 — End: ?

## 2021-11-20 NOTE — Progress Notes (Signed)
Inpatient Rehabilitation Care Coordinator Discharge Note   Patient Details  Name: Connie Campbell MRN: [id number] Date of Birth: August 05, 1951   Discharge location: HOME WITH HUSBAND AND DAUGHTER AND SON IN-LAW  Length of Stay:  6 DAYS  Discharge activity level: SUPERVISION LEVEL  Home/community participation: ACTIVE  Patient response SW:[id number] Literacy - How often do you need to have someone help you when you read instructions, pamphlets, or other written material from your doctor or pharmacy?: Never  Patient response MB:[id number] Isolation - How often do you feel lonely or isolated from those around you?: Never  Services provided included: MD, RD, PT, OT, RN, CM, Pharmacy, SW, SLP  Financial Services:  Charity fundraiser Utilized: [place] offered to/list presented to: [place]  Follow-up services arranged:  [place], DME, Patient/Family request agency [place], OT, [place]    DME : [facility] HH/DME Requested Agency: [place]  Patient response to transportation need: Is the patient able to respond to transportation needs?: Yes In the past 12 months, has lack of transportation kept you from medical appointments or from getting medications?: No In the past 12 months, has lack of transportation kept you from meetings, work, or from getting things needed for daily living?: No    Comments (or additional information):HUSBAND, DAUGHTER AND SON  IN-LAW WERE IN FOR EDUCATION AND ALL FEEL COMFORTABLE WITH PT'S CARE NEEDS. FEEL READY FOR DC TOMORROW  Patient/Family verbalized understanding of follow-up arrangements:  Yes  Individual responsible for coordination of the follow-up plan: TRESHA-DAUGHTR [phone]  Confirmed correct DME delivered: Elease Hashimoto 11/20/2021    Loyce Flaming, [place] Rhyme

## 2021-11-20 NOTE — Discharge Summary (Signed)
Physician Discharge Summary  Patient ID: Connie Campbell MRN: [id number] DOB/AGE: May 29, 1951 71 y.o.  Admit date: 11/15/2021 Discharge date: 11/21/2021  Discharge Diagnoses:  Principal Problem:   Ischemic stroke of frontal lobe [facility]) Active Problems:   Hypertension   Chronic pain disorder   Seizure disorder as sequela of cerebrovascular accident ([place])   Type 2 diabetes mellitus ([place])   Cervical dystonia   Discharged Condition: good  Significant Diagnostic Studies:   Labs:  Basic Metabolic Panel: BMP Latest Ref Rng & Units 11/16/2021 11/15/2021 11/12/2021  Glucose 70 - 99 mg/dL 132(H) 231(H) 177(H)  BUN 8 - 23 mg/dL 22 24(H) 21  Creatinine 0.44 - 1.00 mg/dL 0.76 0.90 0.85  Sodium 135 - 145 mmol/L 135 134(L) 136  Potassium 3.5 - 5.1 mmol/L 4.1 4.2 4.4  Chloride 98 - 111 mmol/L 101 100 101  CO2 22 - 32 mmol/L 24 24 24   Calcium 8.9 - 10.3 mg/dL 9.1 9.1 9.1     CBC: CBC Latest Ref Rng & Units 11/19/2021 11/16/2021 11/15/2021  WBC 4.0 - 10.5 K/uL 6.6 7.4 7.1  Hemoglobin 12.0 - 15.0 g/dL 13.6 14.3 14.8  Hematocrit 36.0 - 46.0 % 40.5 41.9 44.8  Platelets 150 - 400 K/uL 229 233 249     CBG: Recent Labs  Lab 11/20/21 0610 11/20/21 1200 11/20/21 1648 11/20/21 2104 11/21/21 0602  GLUCAP 73 97 125* 150* 115*    Brief HPI:   Connie Campbell is a 71 y.o. RH-female with history of glaucoma, A6TK, CVA complicated by status epilepticus and residual right sided deficits, cervical dystonia who was admitted on 11/09/21 with increased tingling of RUE/RLE with RLE weakness and difficulty walking. MRI brain done revealing small acute right frontal infarcts and chronic multiple cerebral and cerebellar strokes.  Patient reported not taking ASA due to issues with gastritis. Dr. Leonie Man felt that stroke L-ACA stroke likely due to embolization and loop recorder placed on 02/08. Dr. Leonie Man recommended DAPTX3 weeks followed by Plavix alone. She continued to have limitations due to RLE weakness with dizziness and  CIR was recommended due to functional decline.    Hospital Course: Connie Campbell was admitted to rehab 11/15/2021 for inpatient therapies to consist of PT and OT at least three hours five days a week. Past admission physiatrist, therapy team and rehab RN have worked together to provide customized collaborative inpatient rehab.  She reported ongoing issues with muscle spasms/dystonia which is worse at nights.  Lipitor was discontinued due to recurrent side effects to statin.  She was educated on food and dietary supplements to help lower LDL.  She was maintained on DAPT during his stay and is to discontinue aspirin after 10 days.  Baclofen was changed to [facility] as she felt that this was more effective in management of symptoms and this has helped her in the past.  Her blood pressures were monitored on TID basis and have been stable. Her diabetes has been monitored with ac/hs CBG checks and SSI was use prn for tighter BS control.  Ozempic was resumed and metformin was titrated up to 1000 mg twice daily with improvement in blood sugars.  Follow-up labs showed mild hyponatremia has resolved and H&H is stable.  She has made good gains during her rehab stay and supervision is recommended with  mobility for safety and fall prevention.  She will continue to receive follow-up home health PT, OT and ST by [facility] after discharge   Rehab course: During patient's stay in rehab weekly team  conferences were held to monitor patient's progress, set goals and discuss barriers to discharge. At admission, patient required min assist with ADL tasks and CGA for mobility. Cognitive evaluation revealed baseline deficits with ability to utilize compensatory strategies independently and ST not needed during her stay. She  has had improvement in activity tolerance, balance, postural control as well as ability to compensate for deficits. She is able to complete ADL tasks at modified independent level. She is independent for  transfers and to ambulate 27' with RW but with BERG score 28/56 and high fall risk. Family education was completed regarding all aspects of care.    Disposition: Home  Diet: heart healthy/Carb modified.   Special Instructions: No driving or strenuous activity till cleared by MD.  Continue to monitor blood sugars 2-3 times a day and follow-up with PCP for further adjustment.  Discharge Instructions     Ambulatory referral to Neurology   Complete by: As directed    An appointment is requested in approximately: 4 weeks      Allergies as of 11/21/2021       Reactions   Statins Other (See Comments)   Causes spasms and increased dystonia   Aspirin Other (See Comments)   GI upset   Charentais Melon (french Melon) Other (See Comments)   Erythromycin Base Other (See Comments), Rash   Ginkgo Biloba Other (See Comments), Rash   Bactrim [sulfamethoxazole-trimethoprim] Hives   Beta Vulgaris    Ciprofloxacin    Coconut Oil Other (See Comments)   seizure   Gluten Meal Diarrhea   Keflex [cephalexin] Swelling   Peanut Butter Flavor Hives   Penicillins Swelling   Tramadol Other (See Comments)   Depression, GI discomfort   Ibuprofen Diarrhea   Other reaction(s): GI intolerance STOMACH PAINS bleeding Other reaction(s): GI intoleran        Medication List     STOP taking these medications    atorvastatin 40 MG tablet Commonly known as: LIPITOR   baclofen 10 MG tablet Commonly known as: LIORESAL   Jardiance 25 MG Tabs tablet Generic drug: empagliflozin   telmisartan 40 MG tablet Commonly known as: MICARDIS       TAKE these medications    acetaminophen 325 MG tablet Commonly known as: TYLENOL Take 1-2 tablets (325-650 mg total) by mouth every 4 (four) hours as needed for mild pain.   aspirin 81 MG EC tablet Take 1 tablet (81 mg total) by mouth daily. Swallow whole. Notes to patient: FOR 10 MORE DAYS--PURCHASE OVER THE COUNTER   carisoprodol 350 MG  tablet Commonly known as: SOMA Take 1 tablet (350 mg total) by mouth daily as needed (severe muscle spasms).   clopidogrel 75 MG tablet Commonly known as: PLAVIX Take 1 tablet (75 mg total) by mouth daily.   guaiFENesin-dextromethorphan 100-10 MG/5ML syrup Commonly known as: ROBITUSSIN DM Take 5-10 mLs by mouth every 6 (six) hours as needed for cough.   Levemir FlexTouch 100 UNIT/ML FlexPen Generic drug: insulin detemir Inject 60 Units into the skin at bedtime. What changed: how much to take   levETIRAcetam 750 MG tablet Commonly known as: KEPPRA Take 750 mg by mouth 2 (two) times daily.   magnesium gluconate 500 MG tablet Commonly known as: MAGONATE Take 1 tablet (500 mg total) by mouth at bedtime.   metFORMIN 1000 MG tablet Commonly known as: GLUCOPHAGE Take 1 tablet (1,000 mg total) by mouth 2 (two) times daily with a meal. What changed:  medication strength how much to take  when to take this   multivitamin capsule Take 1 capsule by mouth daily.   oxyCODONE-acetaminophen 10-325 MG tablet Commonly known as: PERCOCET Take 1 tablet by mouth 4 (four) times daily as needed for pain.   Ozempic (0.25 or 0.5 MG/DOSE) 2 MG/1.5ML Sopn Generic drug: Semaglutide(0.25 or 0.5MG/DOS) Inject 0.5 mg into the skin once a week. On thursdays What changed: additional instructions   polyvinyl alcohol 1.4 % ophthalmic solution Commonly known as: LIQUIFILM TEARS Place 1 drop into both eyes as needed for dry eyes.   simethicone 125 MG chewable tablet Commonly known as: MYLICON Chew 741 mg by mouth every 6 (six) hours as needed for flatulence.   Synthroid 100 MCG tablet Generic drug: levothyroxine Take 100 mcg by mouth daily.   timolol 0.5 % ophthalmic solution Commonly known as: TIMOPTIC Place 1 drop into the right eye 2 (two) times daily.   traZODone 50 MG tablet Commonly known as: DESYREL Take 0.5-1 tablets (25-50 mg total) by mouth at bedtime as needed for sleep.         Follow-up Information     Raulkar, Clide Deutscher, MD Follow up.   Specialty: Physical Medicine and Rehabilitation Why: 02/20/22 please arrive at 2:00pm for 2:20pm appointment, thank you! Contact information: [address] [place] [address] [phone]         [facility] Follow up.   Why: call for follow up appointment Contact information: [address]     [place] [address] [phone]        Sue Lush, [place]. Call.   Specialty: Physician Assistant Why: for post hospital follow up Contact information: [place] [address] [phone]                 Signed: Bary Leriche 11/23/2021, 6:26 PM

## 2021-11-20 NOTE — Progress Notes (Signed)
Patient ID: Connie Campbell, female   DOB: October 08, 1951, 71 y.o.   MRN: [id number]  Met with pt and husband who went through family education with daughter present also. All report it went well and feel prepared for discharge tomorrow. Pt's rolling walker is in her room and home health is arranged via [facility]. See in am tomorrow for any last minute questions.

## 2021-11-20 NOTE — Progress Notes (Signed)
Occupational Therapy Discharge Summary  Patient Details  Name: Connie Campbell MRN: [id number] Date of Birth: [birth year]  Today's Date: 11/20/2021 OT Individual Time: 4166-0630 OT Individual Time Calculation (min): 54 min    Patient has met 7 of 7 long term goals due to improved activity tolerance, improved balance, postural control, ability to compensate for deficits, improved attention, improved awareness, and improved coordination.  Patient was consistently motivated to participate. Patient to discharge at overall Modified Independent level.  Patient's care partner has completed family education and reports good understanding of training and is independent to provide the necessary intermittent cognitive assistance during shower transfers and IADLs at discharge.    Skilled Interventions: Pt sitting up in recliner, dtr, husband, and son in law present for family education.  Reviewed with family, pts current functional status and need for supervision during shower transfer. Discussed layout of pts bathroom and recommended transfer technique without use of RW due to unaccessible through doorway therefore recommending using sink for balance. Transported pt to ADL bathroom and demonstrated with dtr and son in law safe shower bench transfer and pt return demo'd needing supervision.  Pts dtr and son in law left  and educated pt on safe kitchen mobility and compensatory techniques.  Pt stood and approached sink with supervision and side stepped to return dishes from sink into overhead cabinet.  Pt pivoted and opened fridge door and retrieved food item safely.  Recommended to pt use of walker bag or basket to transport ADL items more independently.  Transported back to room and pt had to use bathroom which she completed mod I ambulating with RW.  Returned to recliner, call bell in reach, seat alarm on.  Reasons goals not met: na  Recommendation:  Further skilled OT not indicated at this time.  Pt will be  receiving home health PT at discharge to further work on balance and mobility skills.  Equipment: None  Reasons for discharge: treatment goals met and discharge from hospital  Patient/family agrees with progress made and goals achieved: Yes  OT Discharge Precautions/Restrictions  Precautions Precautions: Fall Restrictions Weight Bearing Restrictions: No Pain Pain Assessment Pain Scale: Faces Faces Pain Scale: Hurts a little bit Pain Type: Chronic pain Pain Location: Hip Pain Orientation: Right;Left Pain Descriptors / Indicators: Aching Pain Onset: On-going Pain Intervention(s): Repositioned Multiple Pain Sites: No ADL ADL Eating: Independent Where Assessed-Eating: Wheelchair Grooming: Supervision/safety Where Assessed-Grooming: Standing at sink Upper Body Bathing: Setup Where Assessed-Upper Body Bathing: Shower Lower Body Bathing: Setup Where Assessed-Lower Body Bathing: Shower Upper Body Dressing: Modified independent (Device) Where Assessed-Upper Body Dressing: Sitting at sink Lower Body Dressing: Modified independent Where Assessed-Lower Body Dressing: Wheelchair Toileting: Modified independent Where Assessed-Toileting: Glass blower/designer: Diplomatic Services operational officer Method: Counselling psychologist: Radiographer, therapeutic: Close supervison Clinical cytogeneticist Method: Optometrist: Gaffer Baseline Vision/History: 4 Cataracts Patient Visual Report: No change from baseline;Other (comment);Blurring of vision (needs glasses for distance and reading) Vision Assessment?: No apparent visual deficits Perception  Perception: Within Functional Limits Praxis Praxis: Intact Cognition Overall Cognitive Status: History of cognitive impairments - at baseline Arousal/Alertness: Awake/alert Orientation Level: Oriented X4 Year: 2023 Month: February Day of Week: Correct Attention:  Sustained;Selective Focused Attention: Appears intact Sustained Attention: Appears intact Selective Attention: Appears intact Memory: Impaired Memory Impairment: Retrieval deficit Decreased Short Term Memory: Verbal complex;Functional complex Immediate Memory Recall: Sock;Blue;Bed Memory Recall Sock: Without Cue Memory Recall Blue: Without Cue Memory Recall Bed: Without Cue Awareness: Appears intact Problem Solving:  Impaired Problem Solving Impairment: Functional complex Safety/Judgment: Appears intact Sensation Sensation Light Touch: Impaired by gross assessment Hot/Cold: Appears Intact Proprioception: Appears Intact Stereognosis: Appears Intact Additional Comments: Reports decreased sensation on R side from previous stroke. She also has baseline neuropathy from DM Coordination Gross Motor Movements are Fluid and Coordinated: No Fine Motor Movements are Fluid and Coordinated: Yes Finger Nose Finger Test: WFL BUE Motor  Motor Motor: Hemiplegia Motor - Skilled Clinical Observations: R Hemi (RLE>RUE) Mobility  Transfers Sit to Stand: Independent with assistive device Stand to Sit: Independent with assistive device  Trunk/Postural Assessment  Cervical Assessment Cervical Assessment: Exceptions to [facility] (forward head) Thoracic Assessment Thoracic Assessment: Exceptions to [facility] (rounded shoulders) Lumbar Assessment Lumbar Assessment: Exceptions to [facility] (posterior pelvic tilt) Postural Control Postural Control: Within Functional Limits (delayed protective response)  Balance Balance Balance Assessed: Yes Static Sitting Balance Static Sitting - Balance Support: Feet unsupported;No upper extremity supported Static Sitting - Level of Assistance: 7: Independent Dynamic Sitting Balance Dynamic Sitting - Balance Support: Feet unsupported;No upper extremity supported Dynamic Sitting - Level of Assistance: 5: Stand by assistance Static Standing Balance Static Standing - Balance  Support: No upper extremity supported;During functional activity Static Standing - Level of Assistance: 5: Stand by assistance Dynamic Standing Balance Dynamic Standing - Balance Support: No upper extremity supported;During functional activity Dynamic Standing - Level of Assistance: 5: Stand by assistance Extremity/Trunk Assessment RUE Assessment RUE Assessment: Within Functional Limits LUE Assessment LUE Assessment: Within Functional Limits   Ezekiel Slocumb 11/20/2021, 12:37 PM

## 2021-11-20 NOTE — Progress Notes (Signed)
Inpatient Rehabilitation Discharge Medication Review by a Pharmacist  A complete drug regimen review was completed for this patient to identify any potential clinically significant medication issues.  High Risk Drug Classes Is patient taking? Indication by Medication  Antipsychotic No   Anticoagulant Yes Lovenox for DVT prophx only for inpt  Antibiotic No   Opioid Yes Percocet for chronic pain   Antiplatelet Yes ASA (x3 wks), Plavix for CVA prophx.  Hypoglycemics/insulin Yes SSI for here, Levemir, metformin, Ozempic for DM  Vasoactive Medication No   Chemotherapy No   Other Yes Keppra for seizure prophx Soma - spasms Levothyroxine - hypothyroidism     Type of Medication Issue Identified Description of Issue Recommendation(s)  Drug Interaction(s) (clinically significant)     Duplicate Therapy     Allergy     No Medication Administration End Date     Incorrect Dose     Additional Drug Therapy Needed  PTA Jardiance and Telmisartan not resume Resume as needed  Significant med changes from prior encounter (inform family/care partners about these prior to discharge).    Other       Clinically significant medication issues were identified that warrant physician communication and completion of prescribed/recommended actions by midnight of the next day:  No  Time spent performing this drug regimen review (minutes):  15 min  Onnie Boer, PharmD, [place], AAHIVP, CPP Infectious Disease Pharmacist 11/20/2021 1:44 PM

## 2021-11-20 NOTE — Progress Notes (Signed)
PROGRESS NOTE   Subjective/Complaints: Her dystonia was bothering her yesterday- she is still wary of trying Botox due to need for injections. In [place] she found Soma very helpful to her   ROS: +pain from cervical dystonia Pt denies SOB, abd pain, CP, N/V/C/D, and vision changes   Objective:   No results found. Recent Labs    11/19/21 2354  WBC 6.6  HGB 13.6  HCT 40.5  PLT 229    No results for input(s): NA, K, CL, CO2, GLUCOSE, BUN, CREATININE, CALCIUM in the last 72 hours.   Intake/Output Summary (Last 24 hours) at 11/20/2021 1003 Last data filed at 11/20/2021 0730 Gross per 24 hour  Intake 1318 ml  Output --  Net 1318 ml        Physical Exam: Vital Signs Blood pressure 128/64, pulse 72, temperature 97.7 F (36.5 C), temperature source Oral, resp. rate 16, height 5' 3"  (1.6 m), weight 83.3 kg, SpO2 97 %.    General: awake, alert, appropriate,sitting up in w/c at bedside;  NAD, BMI 32.53 HENT: conjugate gaze; oropharynx moist- cervical dystonia noted CV: regular rate; no JVD Pulmonary: CTA B/L; no W/R/R- good air movement GI: soft, NT, ND, (+)BS Psychiatric: appropriate Neurological: alert Neurological:     Mental Status: She is alert and oriented to person, place, and time. +cervical dystonia    Comments: Decreased hearing. Right facial weakness without mild dysarthria. Right sided weakness with sensory deficits. Able to follow simple 1 and 2 step simple motor commands.      Assessment/Plan: 1. Functional deficits which require 3+ hours per day of interdisciplinary therapy in a comprehensive inpatient rehab setting. Physiatrist is providing close team supervision and 24 hour management of active medical problems listed below. Physiatrist and rehab team continue to assess barriers to discharge/monitor patient progress toward functional and medical goals  Care Tool:  Bathing    Body parts  bathed by patient: Right arm, Left arm, Chest, Abdomen, Front perineal area, Buttocks, Right upper leg, Left upper leg, Right lower leg, Left lower leg, Face         Bathing assist Assist Level: Supervision/Verbal cueing     Upper Body Dressing/Undressing Upper body dressing   What is the patient wearing?: Pull over shirt    Upper body assist Assist Level: Set up assist    Lower Body Dressing/Undressing Lower body dressing      What is the patient wearing?: Underwear/pull up, Pants     Lower body assist Assist for lower body dressing: Supervision/Verbal cueing     Toileting Toileting    Toileting assist Assist for toileting: Minimal Assistance - Patient > 75%     Transfers Chair/bed transfer  Transfers assist     Chair/bed transfer assist level: Supervision/Verbal cueing Chair/bed transfer assistive device: Programmer, multimedia   Ambulation assist      Assist level: Supervision/Verbal cueing Assistive device: Walker-rolling Max distance: 150 ft   Walk 10 feet activity   Assist     Assist level: Supervision/Verbal cueing Assistive device: Walker-rolling   Walk 50 feet activity   Assist    Assist level: Supervision/Verbal cueing Assistive device: Walker-rolling  Walk 150 feet activity   Assist    Assist level: Supervision/Verbal cueing Assistive device: Walker-rolling    Walk 10 feet on uneven surface  activity   Assist Walk 10 feet on uneven surfaces activity did not occur: Safety/medical concerns   Assist level: Supervision/Verbal cueing Assistive device: Walker-rolling   Wheelchair     Assist Is the patient using a wheelchair?: No Type of Wheelchair: Manual    Wheelchair assist level: Supervision/Verbal cueing Max wheelchair distance: 54f    Wheelchair 50 feet with 2 turns activity    Assist        Assist Level: Supervision/Verbal cueing   Wheelchair 150 feet activity     Assist   Wheelchair 150 feet activity did not occur: Safety/medical concerns       Blood pressure 128/64, pulse 72, temperature 97.7 F (36.5 C), temperature source Oral, resp. rate 16, height 5' 3"  (1.6 m), weight 83.3 kg, SpO2 97 %.    Medical Problem List and Plan: 1. 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 Keep dressing on chest clean and dry.              -ELOS/Goals: 5-7 days modI             Messaged team to assess discharge recs.   HFU scheduled  -Continue CIR- PT, OT and SLP 2.  Antithrombotics: -DVT/anticoagulation:  Pharmaceutical: Lovenox             -antiplatelet therapy: DAPT X 3 weeks followed by plavix alone.  3. Chronic pain with dystonia/Pain Management: Continue percocet prn --has insomnia and sleeps in recline. Gets up once a night for pain pill.    -add back baclofen which was helping -discussed risks and benefits of outpatient Botox.  -ordered Soma daily prn. Discussed risks and benefits.  4. Insomnia: continue magnesium to 5063mHS.  5. Neuropsych: This patient is capable of making decisions on her own behalf. 6. Skin/Wound Care: Routine pressure relief measures.  7. Fluids/Electrolytes/Nutrition: Monitor I/O.  8. HTN: Monitor BP TID- SBP goal <140             --discontinue Avapro 9.T2DM: Hgb A1C-8.0. Uncontrolled on Jardiance, Levemir 80U, increase metformin to 100082mID, Ozempic weekly on Wed PTA.--->patient will have husband bring in ozempic to resume regimen.  --Continue levemir 60 units. Resume Metformin at 500 mg TID as BS poorly controlled.  --monitor BS ac/hs. Use SSI for elevated BS and titrate insulin and metformin to home dose. 2/12- CBGs 62- 142- 62 was early AM today- didn't eat snack- will order   10. Seizure d/o: Stable and controlled on Keppra 750 mg bid  11. Dyslipidemia: Provided list of foods and supplements that can help lower LDL and discussed with family. See #13.  12. Hypothyroid:  Managed on synthroid for supplement. 13. 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  14. Obesity BMI 32.53:provide dietary education  LOS: 5 days A FACE TO FACE EVALUATION WAS PERFORMED  Nachmen Mansel P Rayaan Garguilo 11/20/2021, 10:03 AM

## 2021-11-20 NOTE — Progress Notes (Signed)
Physical Therapy Discharge Summary  Patient Details  Name: Connie Campbell MRN: [id number] Date of Birth: Nov 28, 1950  Today's Date: 11/20/2021 PT Individual Time: 1418-1530 + 1418-1530 PT Individual Time Calculation (min): 72 min + 72 min  Session 1- family training Pt met sitting in recliner, reported no pain, family present (husband, daughter, and son in law), nurse and MD morning round visit occurring at start of session. Missed 15 min due to morning rounds and nursing education. Informed family of PT recommendations of RW w/ S for d/c and answered any questions from family. Pt ambulated from room to ortho gym ~142f w/ RW S for car transfer. Pt completed car transfer w/ son in law providing set up assist. Pt then ambulated ascending and descending ramp w/ son in law. Pt and son in law demonstrated ability to adhere to PT recommendations and comfortable w/ pt progress. Pt ambulated to rehab gym from ortho gym as noted above ~736f LE motor exam completed during rest break. Pt completed 4 steps ascending and descending step to pattern w/ bil hand rails S. Pt then completed 4 steps same as noted above w/ unilateral R hand rail and completed 1 step w/o hand rail w/ CGA. Pt's family video taped stairs, car transfer, and PT recommendations for personal use. Finished session, w/ pt ambulation back to room ~15079feft sitting in recliner, seat belt alarm on, call bell in hand, husband present, and all needs met.  Session 2 Pt met sitting in recliner, reported no pain, and agreeable to PT. Pt ambulated w/ RW S to rehab gym ~[id number], TUG x3, 5xSTS x2, and 6MWT completed w/ scores noted below. Pt improvement in dynamic balance, decreased fall risk, and increased endurance and power based on outcomes of tests. During rest breaks, pt educated on BEFAST handout and confirmed understanding. Pt reported 6/10 chronic bil hip pain during BERG, RN notified for pain medication. Pt ambulated as noted above to ortho gym  ~75ft66f ramp, completing w/ RW S. Pt demonstrated control of RW during descent of ramp and no LOB. Pt ambulated to ADL apartment ~50ft 44fbed and furniture transfer. Pt completed bed and furniture transfer w/ S and VC for hand placement during stand<>sit transfer. Pt ambulated back to room ~75ft a78fransferred to recliner, left sitting in recliner, seat belt on, call bell in hand, and all needs met. Pt encouraged w/ results and appreciative of all the service provided.  Patient demonstrates increased fall risk as noted by score of   38/56 on Berg Balance Scale.  (<36= high risk for falls, close to 100%; 37-45 significant >80%; 46-51 moderate >50%; 52-55 lower >25%) Berg on 2/9 - 28/56    BERG 28/56 on 2/9  5xSTS 37 sec on 2/9  *5xSTS > 15s = increased falls risk  TUG 41.5 sec on 2/9  *TUG > 13.5s = increased falls risk  6MWT 350ft on29f  * age predicted norm 1765ft [id number] 0.29 m/s on 2/9   BERG 38/56 on 2/13  5xSTS 23.5s on 2/13  TUG 21.1s on 2/13  6MWT 757ft on 291f Gait speed 0.64 m/s on 2/9   Patient has met 8 of 8 long term goals due to improved activity tolerance, improved balance, increased strength, and decreased pain.  Patient to discharge at an ambulatory level Modified Independent.   Patient's care partner is independent to provide the necessary physical assistance at discharge. Family training completed as noted above on 2/13.  Reasons goals not met: N/A  Recommendation:  Patient will benefit from ongoing skilled PT services in home health setting to continue to advance safe functional mobility, address ongoing impairments in endurance, strength, power dynamic balance and minimize fall risk.  Equipment: RW  Reasons for discharge: treatment goals met  Patient/family agrees with progress made and goals achieved: Yes  PT Discharge Precautions/Restrictions Precautions Precautions: Fall Restrictions Weight Bearing Restrictions: No Vital Signs Therapy  Vitals Temp: 98.3 F (36.8 C) Temp Source: Oral Pulse Rate: 77 Resp: 18 BP: 127/67 Patient Position (if appropriate): Sitting Oxygen Therapy SpO2: 98 % O2 Device: Room Air Pain Pain Assessment Pain Scale: Faces Faces Pain Scale: Hurts a little bit Pain Type: Chronic pain Pain Location: Hip Pain Orientation: Right;Left Pain Descriptors / Indicators: Aching Pain Onset: On-going Pain Intervention(s): Repositioned Multiple Pain Sites: No Pain Interference Pain Interference Pain Effect on Sleep: 3. Frequently Pain Interference with Therapy Activities: 1. Rarely or not at all Pain Interference with Day-to-Day Activities: 3. Frequently Vision/Perception  Vision - History Ability to See in Adequate Light: 0 Adequate Perception Perception: Within Functional Limits Praxis Praxis: Intact  Cognition Overall Cognitive Status: History of cognitive impairments - at baseline Arousal/Alertness: Awake/alert Orientation Level: Oriented X4 Year: 2023 Month: February Day of Week: Correct Attention: Sustained;Selective Focused Attention: Appears intact Sustained Attention: Appears intact Selective Attention: Appears intact Memory: Impaired Memory Impairment: Retrieval deficit Decreased Short Term Memory: Verbal complex;Functional complex Immediate Memory Recall: Sock;Blue;Bed Memory Recall Sock: Without Cue Memory Recall Blue: Without Cue Memory Recall Bed: Without Cue Awareness: Appears intact Problem Solving: Impaired Problem Solving Impairment: Functional complex Safety/Judgment: Appears intact Sensation Sensation Light Touch: Impaired by gross assessment Hot/Cold: Appears Intact Proprioception: Appears Intact Stereognosis: Appears Intact Additional Comments: Reports decreased sensation on R side from previous stroke. She also has baseline neuropathy from DM Coordination Gross Motor Movements are Fluid and Coordinated: No Fine Motor Movements are Fluid and Coordinated:  Yes Finger Nose Finger Test: WFL BUE Motor  Motor Motor: Hemiplegia Motor - Skilled Clinical Observations: R Hemi (RLE>RUE)  Mobility Transfers Transfers: Sit to Stand;Stand to Sit;Stand Pivot Transfers Sit to Stand: Independent with assistive device Stand to Sit: Independent with assistive device Stand Pivot Transfers: Independent with assistive device Stand Pivot Transfer Details: Tactile cues for posture Transfer (Assistive device): Rolling walker Locomotion  Gait Ambulation: Yes Gait Assistance: Independent with assistive device Gait Distance (Feet): 757 Feet Assistive device: Rolling walker Gait Gait: Yes Gait Pattern: Impaired Gait Pattern: Step-through pattern;Trunk flexed;Decreased stride length Gait velocity: 0.64 m/s Stairs / Additional Locomotion Stairs: Yes Stairs Assistance: Supervision/Verbal cueing Stair Management Technique: Two rails Number of Stairs: 12 Height of Stairs: 6 Ramp: Supervision/Verbal cueing Pick up small object from the floor assist level: Supervision/Verbal cueing  Trunk/Postural Assessment  Cervical Assessment Cervical Assessment: Exceptions to [facility] (forward head) Thoracic Assessment Thoracic Assessment: Exceptions to [facility] (rounded shoulders) Lumbar Assessment Lumbar Assessment: Exceptions to [facility] (posterior pelvic tilt) Postural Control Postural Control: Within Functional Limits (delayed protective response)  Balance Balance Balance Assessed: Yes Standardized Balance Assessment Standardized Balance Assessment: Berg Balance Test;Timed Up and Go Test Berg Balance Test Sit to Stand: Able to stand  independently using hands Standing Unsupported: Able to stand safely 2 minutes Sitting with Back Unsupported but Feet Supported on Floor or Stool: Able to sit safely and securely 2 minutes Stand to Sit: Sits safely with minimal use of hands Transfers: Able to transfer safely, definite need of hands Standing Unsupported with Eyes Closed: Able  to stand 10 seconds with supervision Standing Ubsupported with  Feet Together: Able to place feet together independently and stand for 1 minute with supervision From Standing, Reach Forward with Outstretched Arm: Can reach forward >5 cm safely (2") From Standing Position, Pick up Object from Floor: Able to pick up shoe, needs supervision From Standing Position, Turn to Look Behind Over each Shoulder: Looks behind from both sides and weight shifts well Turn 360 Degrees: Able to turn 360 degrees safely but slowly Standing Unsupported, Alternately Place Feet on Step/Stool: Able to complete >2 steps/needs minimal assist Standing Unsupported, One Foot in Front: Able to take small step independently and hold 30 seconds Standing on One Leg: Unable to try or needs assist to prevent fall Total Score: 38 Timed Up and Go Test TUG: Normal TUG;Manual TUG Normal TUG (seconds): 22.17 (AVG of 3 trials. 24s, 20s, 21.5s. With RW) Static Sitting Balance Static Sitting - Balance Support: Feet unsupported;No upper extremity supported Static Sitting - Level of Assistance: 7: Independent Dynamic Sitting Balance Dynamic Sitting - Balance Support: Feet unsupported;No upper extremity supported Dynamic Sitting - Level of Assistance: 5: Stand by assistance Static Standing Balance Static Standing - Balance Support: During functional activity;Bilateral upper extremity supported Static Standing - Level of Assistance: 6: Modified independent (Device/Increase time) Dynamic Standing Balance Dynamic Standing - Balance Support: No upper extremity supported;During functional activity Dynamic Standing - Level of Assistance: 5: Stand by assistance Extremity Assessment  RUE Assessment RUE Assessment: Within Functional Limits LUE Assessment LUE Assessment: Within Functional Limits RLE Assessment RLE Assessment: Exceptions to [facility] RLE Strength Right Hip Flexion: 4/5 Right Hip ABduction: 4+/5 Right Hip ADduction: 4+/5 Right  Knee Flexion: 5/5 Right Knee Extension: 4+/5 Right Ankle Dorsiflexion: 4+/5 Right Ankle Plantar Flexion: 4+/5 LLE Assessment LLE Assessment: Exceptions to [facility] LLE Strength Left Hip Flexion: 4/5 Left Hip ABduction: 4+/5 Left Hip ADduction: 4+/5 Left Knee Flexion: 5/5 Left Knee Extension: 4+/5 Left Ankle Dorsiflexion: 4+/5 Left Ankle Plantar Flexion: 4+/5    [place], SPT  11/20/2021, 3:51 PM

## 2021-11-21 LAB — GLUCOSE, CAPILLARY
Glucose-Capillary: 211 mg/dL — ABNORMAL HIGH (ref 70–99)
Glucose-Capillary: 104 mg/dL — ABNORMAL HIGH (ref 70–99)
Glucose-Capillary: 193 mg/dL — ABNORMAL HIGH (ref 70–99)
Glucose-Capillary: 97 mg/dL (ref 70–99)
Glucose-Capillary: 115 mg/dL — ABNORMAL HIGH (ref 70–99)

## 2021-11-21 NOTE — Progress Notes (Signed)
Patient discharged off of unit with all belongings. Discharge papers/instructions explained by physician assistant to family. Patient and family have no further questions at time of discharge. No complications noted at this time. Home medication returned to patient from pharmacy.  [place]

## 2021-11-23 ENCOUNTER — Telehealth: Payer: Self-pay

## 2021-11-23 NOTE — Telephone Encounter (Signed)
While performing a Transitional Care Call with patients daughter Connie Campbell she stated that a loop was place in patient heart for monitoring and the daughter wanted to know does she and with you does she need to f/u with on that?

## 2021-11-23 NOTE — Telephone Encounter (Signed)
Transitional Care call--Tresha-daughter    Are you/is patient experiencing any problems since coming home? No Are there any questions regarding any aspect of care? No Are there any questions regarding medications administration/dosing? No Are meds being taken as prescribed? Yes Patient should review meds with caller to confirm Have there been any falls? No Has Home Health been to the house and/or have they contacted you? Yes If not, have you tried to contact them? Can we help you contact them? Are bowels and bladder emptying properly? Yes Are there any unexpected incontinence issues? If applicable, is patient following bowel/bladder programs? Any fevers, problems with breathing, unexpected pain? No Are there any skin problems or new areas of breakdown? No Has the patient/family member arranged specialty MD follow up (ie cardiology/neurology/renal/surgical/etc)? Yes Can we help arrange? Does the patient need any other services or support that we can help arrange? Yes Are caregivers following through as expected in assisting the patient? Yes Has the patient quit smoking, drinking alcohol, or using drugs as recommended? Yes  Appointment time 2:20 pm, arrive time 2:00 pm with Dr. Ranell Patrick on 02/20/22 [place] [address]

## 2021-11-24 NOTE — Telephone Encounter (Signed)
Left message for Tresha-daughter to call me back.

## 2021-11-27 ENCOUNTER — Telehealth: Payer: Self-pay

## 2021-11-27 NOTE — Telephone Encounter (Signed)
NOTES SCANNED TO REFERRAL 

## 2021-11-28 ENCOUNTER — Other Ambulatory Visit: Payer: Self-pay | Admitting: Physical Medicine and Rehabilitation

## 2021-11-29 ENCOUNTER — Ambulatory Visit (INDEPENDENT_AMBULATORY_CARE_PROVIDER_SITE_OTHER): Payer: Medicare HMO

## 2021-11-29 ENCOUNTER — Other Ambulatory Visit: Payer: Self-pay

## 2021-11-29 DIAGNOSIS — I639 Cerebral infarction, unspecified: Secondary | ICD-10-CM

## 2021-11-29 NOTE — Patient Instructions (Signed)
° °  After Your Pacemaker   Monitor your pacemaker site for redness, swelling, and drainage. Call the device clinic at [phone] if you experience these symptoms or fever/chills.  You may use a hot tub or a pool after your wound check appointment if the incision is completely closed.  You may drive, unless driving has been restricted by your healthcare providers.  Your Pacemaker  MRI compatible.  Remote monitoring is used to monitor your loop recorder from home. This monitoring is scheduled every 33 days by our office. It allows [place] to keep an eye on the functioning of your device to ensure it is working properly. You will routinely see your Electrophysiologist annually (more often if necessary).

## 2021-11-29 NOTE — Progress Notes (Signed)
ILR wound check in clinic. Steri strips removed. Wound well healed. Home monitor transmitting nightly. No episodes. Questions answered.

## 2021-12-11 ENCOUNTER — Ambulatory Visit (INDEPENDENT_AMBULATORY_CARE_PROVIDER_SITE_OTHER): Payer: Medicare HMO | Admitting: Internal Medicine

## 2021-12-11 ENCOUNTER — Encounter: Payer: Self-pay | Admitting: Internal Medicine

## 2021-12-11 ENCOUNTER — Other Ambulatory Visit: Payer: Self-pay

## 2021-12-11 VITALS — BP 118/70 | HR 84 | Ht 63.0 in | Wt 183.2 lb

## 2021-12-11 DIAGNOSIS — Z794 Long term (current) use of insulin: Secondary | ICD-10-CM

## 2021-12-11 DIAGNOSIS — E118 Type 2 diabetes mellitus with unspecified complications: Secondary | ICD-10-CM

## 2021-12-11 DIAGNOSIS — E1169 Type 2 diabetes mellitus with other specified complication: Secondary | ICD-10-CM

## 2021-12-11 DIAGNOSIS — I639 Cerebral infarction, unspecified: Secondary | ICD-10-CM

## 2021-12-11 DIAGNOSIS — E1159 Type 2 diabetes mellitus with other circulatory complications: Secondary | ICD-10-CM

## 2021-12-11 DIAGNOSIS — I152 Hypertension secondary to endocrine disorders: Secondary | ICD-10-CM

## 2021-12-11 DIAGNOSIS — E785 Hyperlipidemia, unspecified: Secondary | ICD-10-CM

## 2021-12-11 MED ORDER — EMPAGLIFLOZIN 10 MG PO TABS: 10.0000 mg | ORAL_TABLET | Freq: Every day | ORAL | 5 refills | Status: DC

## 2021-12-11 NOTE — Patient Instructions (Addendum)
Medication Instructions:  ?Your physician has recommended you make the following change in your medication:  ? ?1.) start Jardiance 10 mg - take one tablet daily ? ?*If you need a refill on your cardiac medications before your next appointment, please call your pharmacy* ? ? ?Lab Work: ?none ? ?Testing/Procedures: Echo Bubble Study ?Your physician has requested that you have an echocardiogram. Echocardiography is a painless test that uses sound waves to create images of your heart. It provides your doctor with information about the size and shape of your heart and how well your heart?s chambers and valves are working. This procedure takes approximately one hour. There are no restrictions for this procedure. ? ? ?Follow-Up: ?At [facility], you and your health needs are our priority.  As part of our continuing mission to provide you with exceptional heart care, we have created designated Provider Care Teams.  These Care Teams include your primary Cardiologist (physician) and Advanced Practice Providers (APPs -  Physician Assistants and Nurse Practitioners) who all work together to provide you with the care you need, when you need it. ? ?We recommend signing up for the patient portal called "MyChart".  Sign up information is provided on this After Visit Summary.  MyChart is used to connect with patients for Virtual Visits (Telemedicine).  Patients are able to view lab/test results, encounter notes, upcoming appointments, etc.  Non-urgent messages can be sent to your provider as well.   ?To learn more about what you can do with MyChart, go to [email or web address].   ? ?Your next appointment:   ?6 month(s) ? ?The format for your next appointment:   ?In Person ? ?Provider:   ?Lenna Sciara, MD ? ?Other Instructions ?You have been referred to Pharmacy for statin intolerance.  You will be scheduled for a visit in our office with one of the PharmD providers. ?  ?

## 2021-12-11 NOTE — Progress Notes (Signed)
Cardiology Office Note:    Date:  12/11/2021   ID:  Connie Campbell, DOB Mar 12, 1951, MRN [id number]  PCP:  Elinor Parkinson   [place] Providers Cardiologist:  Lenna Sciara, MD Referring MD: Sue Lush, PA-C   Chief Complaint/Reason for Referral:  Stroke  ASSESSMENT:    Acute CVA (cerebrovascular accident) [facility])  Type 2 diabetes mellitus with complication, with long-term current use of insulin ([place])  Hypertension associated with diabetes ([place])  Hyperlipidemia associated with type 2 diabetes mellitus ([place])  PLAN:    In order of problems listed above: 1.  Will obtain bubble study echocardiogram but would need to consider carefully the role of PFO closure if bubble study is positive given age > 69 (outside of study population), ROPE score < 6, and other cardiovascular risk factors.  However she is a special case because she has had multiple strokes.  Her last stroke she was not on antiplatelets.  If she were to have another stroke while on Plavix I think this might be a compelling argument for closure despite the fact she is outside the studied population.  Continue long term monitoring for atrial fibrillation (no AF on LINQ to date) and consider anticoagulation if AF is detected.  Follow up 6 months.  If her bubble study echocardiogram is positive I will have her meet with me on an expedited basis to discuss things in greater detail. 2.  Continue plavix in lieu of ASA, restart Jardiance 35m, consider restarting ARB, and will address LDL as below. 3.  BP controlled but consider restarting low dose ARB for renal protection. 4.  Patient is intolerant of multiple statins (muscle aches, spasms, and worsening dystonia).  She requires intensive LDL risk factor control.  Will refer to pharmacy for consideration for alternative therapy.  Dispo:  Return in about 6 months (around 06/13/2022).     Medication Adjustments/Labs and Tests Ordered: Current medicines are reviewed at  length with the patient today.  Concerns regarding medicines are outlined above.   Tests Ordered: No orders of the defined types were placed in this encounter.   Medication Changes: No orders of the defined types were placed in this encounter.   History of Present Illness:    FOCUSED PROBLEM LIST:   1.  Hypertension 2.  Hyperlipidemia with statin-induced myalgias 3.  History of multiple prior strokes with acute stroke February 2023; ROPE score 4: LINQ monitor placed 2/23 4.  T2DM on insulin 5.  Two remote DVTs but taken off Coumadin due to wildly fluctuating INRs (in 1991 and 2001, in [place] around two separate hospitalizations  The patient is a 71y.o. female with the indicated medical history here for recommendations regarding recently sustained a stroke.  Patient presented with right leg discoordination and weakness.  Imaging confirmed left frontal lobe acute infarctions.  Her EKG demonstrated normal sinus rhythm.  MRI demonstrated no large vessel occlusions and carotid Dopplers were negative as well.  An echocardiogram was done which demonstrated ejection fraction of 60 to 65%.  Left atrial and right atrial sizes were normal.  No bubble study was performed.  LINQ monitor placed last month demonstrates no atrial fibrillation.   Since discharge from rehab she has been doing quite well.  She is able to ambulate without exertional dyspnea or chest pain.  She has had no palpitations.  She has no residual significant deficits.  She feels like she is getting stronger.  She has had no bleeding or bruising episodes while on dual  antiplatelet therapy and now Plavix monotherapy.  We spoke at great length about her history of statin intolerance.  She tells me she has tried multiple statins but she cannot remember which ones.  She tells me her dystonia was exacerbated and she developed muscle spasms with some increase in her chronic myalgias.  Previous Medical History: Past Medical History:  Diagnosis  Date   Bilateral neck pain    with tension headaches at times.   Celiac disease    Chronic pain    BUE/BLE and constant.   Class 1 obesity due to excess calories with body mass index (BMI) of 31.0 to 31.9 in adult    Dyslipidemia    Dystonia    Glaucoma    Hypertension    Hypothyroidism (acquired)    Migraines    Stroke (HCC)    Trigeminal neuralgia      Current Medications: Current Meds  Medication Sig   acetaminophen (TYLENOL) 325 MG tablet Take 1-2 tablets (325-650 mg total) by mouth every 4 (four) hours as needed for mild pain.   carisoprodol (SOMA) 350 MG tablet Take 1 tablet (350 mg total) by mouth daily as needed (severe muscle spasms).   clopidogrel (PLAVIX) 75 MG tablet Take 1 tablet (75 mg total) by mouth daily.   guaiFENesin-dextromethorphan (ROBITUSSIN DM) 100-10 MG/5ML syrup Take 5-10 mLs by mouth every 6 (six) hours as needed for cough.   insulin detemir (LEVEMIR FLEXTOUCH) 100 UNIT/ML FlexPen Inject 60 Units into the skin at bedtime.   levETIRAcetam (KEPPRA) 750 MG tablet Take 750 mg by mouth 2 (two) times daily.   magnesium gluconate (MAGONATE) 500 MG tablet Take 1 tablet (500 mg total) by mouth at bedtime.   metFORMIN (GLUCOPHAGE) 1000 MG tablet Take 1 tablet (1,000 mg total) by mouth 2 (two) times daily with a meal.   Multiple Vitamin (MULTIVITAMIN) capsule Take 1 capsule by mouth daily.   oxyCODONE-acetaminophen (PERCOCET) 10-325 MG tablet Take 1 tablet by mouth 4 (four) times daily as needed for pain.   OZEMPIC, 0.25 OR 0.5 MG/DOSE, 2 MG/1.5ML SOPN Inject 0.5 mg into the skin once a week. On thursdays   polyvinyl alcohol (LIQUIFILM TEARS) 1.4 % ophthalmic solution Place 1 drop into both eyes as needed for dry eyes.   simethicone (MYLICON) 967 MG chewable tablet Chew 125 mg by mouth every 6 (six) hours as needed for flatulence.   SYNTHROID 100 MCG tablet Take 100 mcg by mouth daily.   timolol (TIMOPTIC) 0.5 % ophthalmic solution Place 1 drop into the right eye 2  (two) times daily.   traZODone (DESYREL) 50 MG tablet TAKE 0.5-1 TABLETS BY MOUTH AT BEDTIME AS NEEDED FOR SLEEP.     Allergies:    Statins, Aspirin, Charentais melon (french melon), Erythromycin base, Ginkgo biloba, Bactrim [sulfamethoxazole-trimethoprim], Beta vulgaris, Ciprofloxacin, Coconut oil, Gluten meal, Keflex [cephalexin], Peanut butter flavor, Penicillins, Tramadol, and Ibuprofen   Social History:   Social History   Tobacco Use   Smoking status: Never   Smokeless tobacco: Never   Tobacco comments:    3 month lifetime smoker  Substance Use Topics   Alcohol use: Yes    Comment: rare   Drug use: Never     Family Hx: Family History  Problem Relation Age of Onset   Heart failure Mother    Cancer Mother    Atrial fibrillation Mother    Cancer Father      Review of Systems:   Please see the history of present illness.  All other systems reviewed and are negative.     EKGs/Labs/Other Test Reviewed:    EKG:  SR  Prior CV studies: TTE 2/23  1. Left ventricular ejection fraction, by estimation, is 60 to 65%. The  left ventricle has normal function. The left ventricle has no regional  wall motion abnormalities. Left ventricular diastolic parameters were  normal.   2. Right ventricular systolic function is normal. The right ventricular  size is normal. There is moderately elevated pulmonary artery systolic  pressure.   3. The mitral valve is abnormal. Trivial mitral valve regurgitation. No  evidence of mitral stenosis.   4. The aortic valve was not well visualized. There is mild calcification  of the aortic valve. Aortic valve regurgitation is trivial. Aortic valve  sclerosis is present, with no evidence of aortic valve stenosis.   5. The inferior vena cava is normal in size with greater than 50%  respiratory variability, suggesting right atrial pressure of 3 mmHg.  Carotid U/S 2/23 Right Carotid: Velocities in the right ICA are consistent with a 1-39%   stenosis.  Left Carotid: Velocities in the left ICA are consistent with a 1-39%  stenosis.  Vertebrals: Bilateral vertebral arteries demonstrate antegrade flow.   Imaging studies that I have independently reviewed today:   MRI Brain 2/23 1. Small acute posterior left frontal lobe infarcts. 2. Chronic ischemia with multiple old cerebral and cerebellar infarcts as above.  MRA Head 2/23 No proximal intracranial vessel occlusion. 2.  Asymmetric poor flow related enhancement of right posterior communicating artery and posterior cerebral artery. May be on a technical basis given lack of acute findings in this distribution on the MRI  Recent Labs: 11/16/2021: ALT 36; BUN 22; Creatinine, Ser 0.76; Potassium 4.1; Sodium 135 11/19/2021: Hemoglobin 13.6; Platelets 229   Recent Lipid Panel Lab Results  Component Value Date/Time   CHOL 162 11/10/2021 04:29 AM   TRIG 166 (H) 11/10/2021 04:29 AM   HDL 39 (L) 11/10/2021 04:29 AM   LDLCALC 90 11/10/2021 04:29 AM    Risk Assessment/Calculations:          Physical Exam:    VS:  BP 118/70    Pulse 84    Ht 5' 3"  (1.6 m)    Wt 183 lb 3.2 oz (83.1 kg)    SpO2 96%    BMI 32.45 kg/m    Wt Readings from Last 3 Encounters:  12/11/21 183 lb 3.2 oz (83.1 kg)  11/16/21 183 lb 10.3 oz (83.3 kg)  11/09/21 183 lb 10.3 oz (83.3 kg)    GENERAL:  No apparent distress, AOx3 HEENT:  No carotid bruits, +2 carotid impulses, no scleral icterus CAR: RRR no murmurs, gallops, rubs, or thrills RES:  Clear to auscultation bilaterally ABD:  Soft, nontender, nondistended, positive bowel sounds x 4 VASC:  +2 radial pulses, +2 carotid pulses, palpable pedal pulses NEURO:  CN 2-12 grossly intact; motor and sensory grossly intact PSYCH:  No active depression or anxiety EXT:  No edema, ecchymosis, or cyanosis  Signed, Early Osmond, MD  12/11/2021 [address], [place], [place]  [address] Phone: [phone];  Fax: [phone]   Note:  This document was prepared using Dragon voice recognition software and may include unintentional dictation errors.

## 2021-12-13 ENCOUNTER — Other Ambulatory Visit: Payer: Self-pay | Admitting: Physical Medicine and Rehabilitation

## 2021-12-15 ENCOUNTER — Other Ambulatory Visit: Payer: Self-pay | Admitting: Physical Medicine and Rehabilitation

## 2021-12-21 ENCOUNTER — Ambulatory Visit (INDEPENDENT_AMBULATORY_CARE_PROVIDER_SITE_OTHER): Payer: Medicare HMO

## 2021-12-21 DIAGNOSIS — I639 Cerebral infarction, unspecified: Secondary | ICD-10-CM | POA: Diagnosis not present

## 2021-12-21 LAB — CUP PACEART REMOTE DEVICE CHECK
Date Time Interrogation Session: 20230316095309
Implantable Pulse Generator Implant Date: 20230208

## 2021-12-26 ENCOUNTER — Other Ambulatory Visit: Payer: Self-pay

## 2021-12-26 ENCOUNTER — Ambulatory Visit (HOSPITAL_COMMUNITY): Payer: Medicare HMO | Attending: Cardiology

## 2021-12-26 DIAGNOSIS — I152 Hypertension secondary to endocrine disorders: Secondary | ICD-10-CM | POA: Diagnosis present

## 2021-12-26 DIAGNOSIS — E118 Type 2 diabetes mellitus with unspecified complications: Secondary | ICD-10-CM | POA: Diagnosis present

## 2021-12-26 DIAGNOSIS — E785 Hyperlipidemia, unspecified: Secondary | ICD-10-CM | POA: Diagnosis present

## 2021-12-26 DIAGNOSIS — Z794 Long term (current) use of insulin: Secondary | ICD-10-CM

## 2021-12-26 DIAGNOSIS — E1159 Type 2 diabetes mellitus with other circulatory complications: Secondary | ICD-10-CM | POA: Diagnosis present

## 2021-12-26 DIAGNOSIS — E1169 Type 2 diabetes mellitus with other specified complication: Secondary | ICD-10-CM | POA: Diagnosis present

## 2021-12-26 DIAGNOSIS — I639 Cerebral infarction, unspecified: Secondary | ICD-10-CM | POA: Diagnosis present

## 2021-12-26 LAB — ECHOCARDIOGRAM COMPLETE BUBBLE STUDY
Area-P 1/2: 4.18 cm2
MV M vel: 4.42 m/s
MV Peak grad: 78.1 mmHg
S' Lateral: 3.1 cm

## 2021-12-29 NOTE — Progress Notes (Signed)
[facility] Summary Report / Loop Recorder 

## 2022-01-04 ENCOUNTER — Ambulatory Visit (INDEPENDENT_AMBULATORY_CARE_PROVIDER_SITE_OTHER): Payer: Medicare HMO | Admitting: Pharmacist

## 2022-01-04 DIAGNOSIS — I639 Cerebral infarction, unspecified: Secondary | ICD-10-CM

## 2022-01-04 NOTE — Progress Notes (Signed)
Patient ID: Connie Campbell                 DOB: [birth year]                    MRN: [id number] ? ? ? ? ?HPI: ?Connie Campbell is a 71 y.o. female patient referred to lipid clinic by Dr. Ali Lowe. 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 MRI brain done revealing small acute right frontal infarcts and chronic multiple cerebral and cerebellar strokes.  Patient reported not taking ASA due to issues with gastritis. Dr. Leonie Man felt that stroke L-ACA stroke likely due to embolization and loop recorder placed on 02/08. ? ?Patient presents today to lipid clinic. Patient walks with a cane due to weakness from strokes. She states that when she takes statins her dystonia becomes 3x worse. She has uncontrollable movements every 20 seconds. Has tried atorvastatin and rosuvastatin. Also on a statin 15 years ago. Probably simvastatin. ? ?Current Medications: none ?Intolerances: atorvastatin 89m, rosuvastatin 163m ?Risk Factors: 3 CVA, DM ?LDL goal: <55 ? ?Diet: mostly vegetarian (some chicken and fish) ?Breakfast: gluten free toast, berries, protein powder mixed with ricotta cheese ?Lunch: casserole and salad or soup ?Dinner: casserole and salad or soup ?Snacks: gluten free crackers ?Drinks: coffee,tea (aCorporate treasurer water, sparkling water, diet soda ? ?Exercise: uses cane, tries to walk around the house, physical therapy exercises ? ?Family History:  ?Family History  ?Problem Relation Age of Onset  ? Heart failure Mother   ? Cancer Mother   ? Atrial fibrillation Mother   ? Cancer Father   ? ? ?Social History: former smoker, occasional ETOH ? ?Labs: 11/10/21 TC 162 TG 166 HDL 39 LDL-C 90 (no therapy) ? ?Past Medical History:  ?Diagnosis Date  ? Bilateral neck pain   ? with tension headaches at times.  ? Celiac disease   ? Chronic  pain   ? BUE/BLE and constant.  ? Class 1 obesity due to excess calories with body mass index (BMI) of 31.0 to 31.9 in adult   ? Dyslipidemia   ? Dystonia   ? Glaucoma   ? Hypertension   ? Hypothyroidism (acquired)   ? Migraines   ? [facility]  ? Trigeminal neuralgia   ? ? ?Current Outpatient Medications on File Prior to Visit  ?Medication Sig Dispense Refill  ? acetaminophen (TYLENOL) 325 MG tablet Take 1-2 tablets (325-650 mg total) by mouth every 4 (four) hours as needed for mild pain.    ? carisoprodol (SOMA) 350 MG tablet Take 1 tablet (350 mg total) by mouth daily as needed (severe muscle spasms). 30 tablet 0  ? clopidogrel (PLAVIX) 75 MG tablet Take 1 tablet (75 mg total) by mouth daily. 30 tablet 0  ? empagliflozin (JARDIANCE) 10 MG TABS tablet Take 1 tablet (10 mg total) by mouth daily before breakfast. 30 tablet 5  ? guaiFENesin-dextromethorphan (ROBITUSSIN DM) 100-10 MG/5ML syrup Take 5-10 mLs by mouth every 6 (six) hours as needed for cough. 118 mL 0  ? insulin detemir (LEVEMIR FLEXTOUCH) 100 UNIT/ML FlexPen Inject 60 Units into the skin at bedtime. 15 mL 11  ? levETIRAcetam (KEPPRA) 750 MG tablet Take 750 mg by mouth 2 (two) times daily.    ? magnesium gluconate (MAGONATE) 500 MG tablet Take 1 tablet (500 mg total) by mouth  at bedtime. 30 tablet 0  ? metFORMIN (GLUCOPHAGE) 1000 MG tablet Take 1 tablet (1,000 mg total) by mouth 2 (two) times daily with a meal. 60 tablet 0  ? Multiple Vitamin (MULTIVITAMIN) capsule Take 1 capsule by mouth daily.    ? oxyCODONE-acetaminophen (PERCOCET) 10-325 MG tablet Take 1 tablet by mouth 4 (four) times daily as needed for pain.    ? OZEMPIC, 0.25 OR 0.5 MG/DOSE, 2 MG/1.5ML SOPN Inject 0.5 mg into the skin once a week. On thursdays 1.5 mL 0  ? polyvinyl alcohol (LIQUIFILM TEARS) 1.4 % ophthalmic solution Place 1 drop into both eyes as needed for dry eyes. 15 mL 0  ? simethicone (MYLICON) 500 MG chewable tablet Chew 125 mg by mouth every 6 (six) hours as needed for  flatulence.    ? SYNTHROID 100 MCG tablet Take 100 mcg by mouth daily.    ? timolol (TIMOPTIC) 0.5 % ophthalmic solution Place 1 drop into the right eye 2 (two) times daily.    ? traZODone (DESYREL) 50 MG tablet TAKE 0.5-1 TABLETS BY MOUTH AT BEDTIME AS NEEDED FOR SLEEP. 90 tablet 1  ? ?No current facility-administered medications on file prior to visit.  ? ? ?Allergies  ?Allergen Reactions  ? Statins Other (See Comments)  ?  Causes spasms and increased dystonia  ? Aspirin Other (See Comments)  ?  GI upset ? ? ?  ? Charentais Melon (French Melon) Other (See Comments)  ? Erythromycin Base Other (See Comments) and Rash  ? Ginkgo Biloba Other (See Comments) and Rash  ? Bactrim [Sulfamethoxazole-Trimethoprim] Hives  ? Beta Vulgaris   ? Ciprofloxacin   ? Coconut Oil Other (See Comments)  ?  seizure  ? Gluten Meal Diarrhea  ? Keflex [Cephalexin] Swelling  ? Peanut Butter Flavor Hives  ? Penicillins Swelling  ? Tramadol Other (See Comments)  ?  Depression, GI discomfort  ? Ibuprofen Diarrhea  ?  Other reaction(s): GI intolerance ?STOMACH PAINS ?bleeding ?Other reaction(s): GI intoleran  ? ? ?Assessment/Plan: ? ?1. Hyperlipidemia - LDL-C is above goal of <55. We discussed that even if she was found to have an arrhythmia, she has other risk factors that would make [place] want to treat her LDL-C/ApoB aggressively. Reviewed PCSK9i injection technique, cost and side effects. Patient is willing to try. Will submit PA for Repatha.  ? ? ?Thank you, ? ? ?Ramond Dial, Pharm.D, BCPS, CPP ?[id number] [place], [place] [address]  ?Phone: [phone]; Fax: [phone]  ? ? ?

## 2022-01-04 NOTE — Patient Instructions (Addendum)
I will submit a prior authorization to your insurance for M[place] or Praluent. ?I will call you once I hear back ?Please try to limit your carbs (crackers, pasta) ?Call me at [phone] with any questions  ?

## 2022-01-05 ENCOUNTER — Telehealth: Payer: Self-pay | Admitting: Pharmacist

## 2022-01-05 DIAGNOSIS — E1169 Type 2 diabetes mellitus with other specified complication: Secondary | ICD-10-CM

## 2022-01-05 MED ORDER — REPATHA SURECLICK 140 MG/ML ~~LOC~~ SOAJ: 1.0000 "pen " | SUBCUTANEOUS | 11 refills | Status: DC

## 2022-01-05 NOTE — Telephone Encounter (Signed)
Repatha approved through 07/03/22. ?Called pt and made her aware. Rx sent to pharmacy. Repeat labs scheduled for 6/21. ?

## 2022-01-10 ENCOUNTER — Telehealth (HOSPITAL_COMMUNITY): Payer: Self-pay | Admitting: Radiology

## 2022-01-10 ENCOUNTER — Telehealth: Payer: Self-pay | Admitting: Internal Medicine

## 2022-01-10 NOTE — Telephone Encounter (Addendum)
Left message for patient to call back. ? ?There were 2 other calls from her today as well:  ? ?Lenard Galloway V at 01/10/2022 11:13 AM ? ?Status: Signed  ?Patient called and left a message to call. During th returned call the patient complained of increased weakness, "chest acting funny" and generalized feeling bad. She wanted to know if she should be seen sooner. I asked if she'd called her PCP. She stated she felt like she needed her cardiologist.  ?  ? ? ?Darrin Nipper L at 01/10/2022  9:28 AM ? ?Status: Signed  ?Pt c/o medication issue: ?  ?1. Name of Medication: Injection for pain management (patient not sure what medication is called) ?  ?2. How are you currently taking this medication (dosage and times per day)? Patient has not had the injection yet. Her appointment is at 10:00 am. She is under the impression it may just be a one time injection  ?  ?3. Are you having a reaction (difficulty breathing--STAT)?  ?  ?4. What is your medication issue? Patient just wants to make sure that these injections will not interact with the heart medicine she is on  ?  ?Dr. Francesco Runner at [facility] [phone]) is going to be giving the patient injections for the pain in her shoulder, neck and back.  ?  ? ?

## 2022-01-10 NOTE — Telephone Encounter (Signed)
Pt c/o medication issue: ? ?1. Name of Medication: Injection for pain management (patient not sure what medication is called) ? ?2. How are you currently taking this medication (dosage and times per day)? Patient has not had the injection yet. Her appointment is at 10:00 am. She is under the impression it may just be a one time injection  ? ?3. Are you having a reaction (difficulty breathing--STAT)?  ? ?4. What is your medication issue? Patient just wants to make sure that these injections will not interact with the heart medicine she is on  ? ?Dr. Francesco Runner at [facility] [phone]) is going to be giving the patient injections for the pain in her shoulder, neck and back. ?

## 2022-01-10 NOTE — Telephone Encounter (Signed)
Attempted to call pt- message voicemail has not been set up.  Was attempting to advise pt that we can't determine if medication is compatible until we know the name of medication.    ?

## 2022-01-10 NOTE — Telephone Encounter (Signed)
Patient called and left a message to call. During th returned call the patient complained of increased weakness, "chest acting funny" and generalized feeling bad. She wanted to know if she should be seen sooner. I asked if she'd called her PCP. She stated she felt like she needed her cardiologist. ?

## 2022-01-10 NOTE — Telephone Encounter (Signed)
Pt c/o Shortness Of Breath: STAT if SOB developed within the last 24 hours or pt is noticeably SOB on the phone ? ?1. Are you currently SOB (can you hear that pt is SOB on the phone)? no ? ?2. How long have you been experiencing SOB? A couple weeks ? ?3. Are you SOB when sitting or when up moving around? Up and moving around ? ?4. Are you currently experiencing any other symptoms?  Fatigue ?She started noticing the SOB when she got home from the hospital. ? ?Patient also said she has a family history of Mitral Valve issues. She wanted to know if there was any reason she should be seen sooner  ?

## 2022-01-16 NOTE — Telephone Encounter (Signed)
Spoke w patient she said she is doing okay other than her sinuses bothering her.  She is aware of her appointment on 01/31/22 follow up with Dr. Ali Lowe.  States she found out that her mom and sister both have problems with their mitral valves.  She will discuss at appointment. ?

## 2022-01-18 ENCOUNTER — Telehealth: Payer: Self-pay | Admitting: Internal Medicine

## 2022-01-18 NOTE — Telephone Encounter (Signed)
Pt c/o medication issue: ? ?1. Name of Medication: Evolocumab (REPATHA SURECLICK) 998 MG/ML SOAJ ? ?2. How are you currently taking this medication (dosage and times per day)? Inject 1 pen. into the skin every 14 (fourteen) days. ? ?3. Are you having a reaction (difficulty breathing--STAT)? no ? ?4. What is your medication issue? Calling to see where she is supposed to inject the medication at. Please advise ? ?

## 2022-01-18 NOTE — Telephone Encounter (Signed)
Opened in error was already handled by Karren Cobble  ?

## 2022-01-18 NOTE — Telephone Encounter (Signed)
Spoke with patient. Has not started medication yet because she had not been feeling well. Is ready to start but forgot how to inject. Advised to take out of fridge and let warm up to room temperature, remove cap, apply yellow end of pen to abdomen or side of leg, press button and hold for 5-10 seconds. Repeat in 14 days. Patient voiced understanding. ?

## 2022-01-23 ENCOUNTER — Ambulatory Visit (INDEPENDENT_AMBULATORY_CARE_PROVIDER_SITE_OTHER): Payer: Medicare HMO

## 2022-01-23 DIAGNOSIS — I639 Cerebral infarction, unspecified: Secondary | ICD-10-CM | POA: Diagnosis not present

## 2022-01-23 LAB — CUP PACEART REMOTE DEVICE CHECK
Date Time Interrogation Session: 20230418095457
Implantable Pulse Generator Implant Date: 20230208

## 2022-01-30 NOTE — Progress Notes (Addendum)
?Cardiology Office Note:   ? ?Date:  01/31/2022  ? ?ID:  Connie Campbell, DOB 03/04/1951, MRN [id number] ? ?PCP:  Sue Lush, PA-C  ? ?[facility] Providers ?Cardiologist:  Lenna Sciara, MD ?Referring MD: Sue Lush, PA-C  ? ?Chief Complaint/Reason for Referral:  Stroke ? ?ASSESSMENT:   ? ?Mitral valve insufficiency, unspecified etiology ? ?Acute CVA (cerebrovascular accident) ([place]) ? ?Type 2 diabetes mellitus with complication, with long-term current use of insulin ([place]) ? ?Hypertension associated with diabetes ([place]) ? ?Hyperlipidemia associated with type 2 diabetes mellitus ([place]) ? ? ? ? ?PLAN:   ? ?In order of problems listed above: ?1.  Mitral regurgitation: We discussed clinical surveillance versus obtaining more information regarding her mitral regurgitation.  The patient would like to proceed with a TEE.  I will see her back in 3 months or earlier if needed depending on the results of this test. ? ?Addendum Feb 16, 2022: I was contacted by Dr. Ainsley Spinner regarding the TEE.  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  The TEE was canceled.  We will refer the patient for an ENT evaluation prior to TEE and a cardiac MRI to quantitate her much regurgitation. ? ?2.  Stroke: Continue Plavix monotherapy.  Bubble study echocardiogram demonstrated no atrial shunts.  LINQ monitor results from April 18th 2023 demonstrates no atrial fibrillation ?3.  Type 2 diabetes: Continue Plavix in lieu of aspirin, continue Jardiance, and patient is being managed by pharmacy in regards to [place] therapy. ?4.  Hypertension: We will restart losartan 99m. ?5.  Hyperlipidemia: On Repatha as managed by pharmacy. ? ?Dispo:  Return in about 3 months (around 05/02/2022).  ?  ? ?Medication Adjustments/Labs and Tests Ordered: ?Current medicines are reviewed at length with the patient today.  Concerns regarding  medicines are outlined above.  ? ?Tests Ordered: ?No orders of the defined types were placed in this encounter. ? ? ?Medication Changes: ?No orders of the defined types were placed in this encounter. ? ? ?History of Present Illness:   ? ?FOCUSED PROBLEM LIST:   ?1.  Hypertension ?2.  Hyperlipidemia with statin-induced myalgias ?3.  History of multiple prior strokes with acute stroke February 2023; no PFO/ASD on TTE March 2023: LINQ monitor placed 2/23 ?4.  T2DM on insulin ?5.  Two remote DVTs but taken off Coumadin due to wildly fluctuating INRs [place] two separate hospitalizations ?6.  Moderate to severe mitral regurgitation TTE March 2023 ?7.  Frailty ? ?March 2023: Patient was seen for initial consultation due to hospital follow-up for stroke.  She is referred for bubble study echocardiogram which demonstrated no PFO or ASD.  It did show moderate to severe mitral vegetation. ? ?Today: The patient tells me that she has been more short of breath of late.  She tells me when she transfers from bed to sitting or when she walks around her house she will get short of breath.  She does not do much in a normal day physically.  She does garden and does try to do some activities of daily living but due to her residual deficits and weakness from her stroke she is not able to do much physically.  She denies any orthopnea, [place] nocturnal dyspnea, presyncope, syncope, severe symptomatic palpitations, or exertional chest pain.  She has required no emergency room visits or hospitalizations. ? ?Current Medications: ?  Current Meds  ?Medication Sig  ? acetaminophen (TYLENOL) 325 MG tablet Take 1-2 tablets (325-650 mg total) by mouth every 4 (four) hours as needed for mild pain.  ? carisoprodol (SOMA) 350 MG tablet Take 1 tablet (350 mg total) by mouth daily as needed (severe muscle spasms).  ? clopidogrel (PLAVIX) 75 MG tablet Take 1 tablet (75 mg total) by mouth daily.  ? empagliflozin (JARDIANCE) 10  MG TABS tablet Take 1 tablet (10 mg total) by mouth daily before breakfast.  ? Evolocumab (REPATHA SURECLICK) 323 MG/ML SOAJ Inject 1 pen. into the skin every 14 (fourteen) days.  ? guaiFENesin-dextromethorphan (ROBITUSSIN DM) 100-10 MG/5ML syrup Take 5-10 mLs by mouth every 6 (six) hours as needed for cough.  ? insulin detemir (LEVEMIR FLEXTOUCH) 100 UNIT/ML FlexPen Inject 60 Units into the skin at bedtime.  ? levETIRAcetam (KEPPRA) 750 MG tablet Take 750 mg by mouth 2 (two) times daily.  ? magnesium gluconate (MAGONATE) 500 MG tablet Take 1 tablet (500 mg total) by mouth at bedtime.  ? metFORMIN (GLUCOPHAGE) 1000 MG tablet Take 1 tablet (1,000 mg total) by mouth 2 (two) times daily with a meal.  ? Multiple Vitamin (MULTIVITAMIN) capsule Take 1 capsule by mouth daily.  ? oxyCODONE-acetaminophen (PERCOCET) 10-325 MG tablet Take 1 tablet by mouth 4 (four) times daily as needed for pain.  ? OZEMPIC, 0.25 OR 0.5 MG/DOSE, 2 MG/1.5ML SOPN Inject 0.5 mg into the skin once a week. On thursdays  ? polyvinyl alcohol (LIQUIFILM TEARS) 1.4 % ophthalmic solution Place 1 drop into both eyes as needed for dry eyes.  ? simethicone (MYLICON) 557 MG chewable tablet Chew 125 mg by mouth every 6 (six) hours as needed for flatulence.  ? SYNTHROID 100 MCG tablet Take 100 mcg by mouth daily.  ? timolol (TIMOPTIC) 0.5 % ophthalmic solution Place 1 drop into the right eye 2 (two) times daily.  ? traZODone (DESYREL) 50 MG tablet TAKE 0.5-1 TABLETS BY MOUTH AT BEDTIME AS NEEDED FOR SLEEP.  ?  ? ?Allergies:    ?Statins, Aspirin, Charentais melon (french melon), Erythromycin base, Ginkgo biloba, Bactrim [sulfamethoxazole-trimethoprim], Beta vulgaris, Ciprofloxacin, Coconut oil, Gluten meal, Keflex [cephalexin], Peanut butter flavor, Penicillins, Tramadol, and Ibuprofen  ? ?Social History:   ?Social History  ? ?Tobacco Use  ? Smoking status: Never  ? Smokeless tobacco: Never  ? Tobacco comments:  ?  3 month lifetime smoker  ?Substance Use  Topics  ? Alcohol use: Yes  ?  Comment: rare  ? Drug use: Never  ?  ? ?Family Hx: ?Family History  ?Problem Relation Age of Onset  ? Heart failure Mother   ? Cancer Mother   ? Atrial fibrillation Mother   ? Cancer Father   ?  ? ?Review of Systems:   ?Please see the history of present illness.    ?All other systems reviewed and are negative. ?  ? ? ?EKGs/Labs/Other Test Reviewed:   ? ?EKG: EKG done today which I reviewed demonstrates sinus rhythm ? ?Prior CV studies: ? ?TTE 4/23 normal ejection fraction with moderate to severe mitral regurgitation the tip of the posterior leaflet looks relatively calcified.  She has mild MAC mitral annular calcification.  No other significant valvular abnormalities. ? ?Carotid U/S 2/23 ?Right Carotid: Velocities in the right ICA are consistent with a 1-39%  ?stenosis.  ?Left Carotid: Velocities in the left ICA are consistent with a 1-39%  ?stenosis.  ?Vertebrals: Bilateral vertebral arteries demonstrate antegrade flow.  ? ?Imaging studies that I have  independently reviewed today:  ? ?MRI Brain 2/23 ?1. Small acute posterior left frontal lobe infarcts. ?2. Chronic ischemia with multiple old cerebral and cerebellar ?infarcts as above. ? ?MRA Head 2/23 ?No proximal intracranial vessel occlusion. ?2.  Asymmetric poor flow related enhancement of right posterior ?communicating artery and posterior cerebral artery. May be on a ?technical basis given lack of acute findings in this distribution on ?the MRI ? ?Recent Labs: ?11/16/2021: ALT 36; BUN 22; Creatinine, Ser 0.76; Potassium 4.1; Sodium 135 ?11/19/2021: Hemoglobin 13.6; Platelets 229  ? ?Recent Lipid Panel ?Lab Results  ?Component Value Date/Time  ? CHOL 162 11/10/2021 04:29 AM  ? TRIG 166 (H) 11/10/2021 04:29 AM  ? HDL 39 (L) 11/10/2021 04:29 AM  ? LDLCALC 90 11/10/2021 04:29 AM  ? ? ?Risk Assessment/Calculations:   ? ? ?    ? ?Physical Exam:   ? ?VS:  BP 128/80   Pulse 87   Ht _0  (1.6 m)   Wt 179 lb 12.8 oz (81.6 kg)   SpO2 99%    BMI 31.85 kg/m?    ?Wt Readings from Last 3 Encounters:  ?01/31/22 179 lb 12.8 oz (81.6 kg)  ?12/11/21 183 lb 3.2 oz (83.1 kg)  ?11/16/21 183 lb 10.3 oz (83.3 kg)  ?  ?GENERAL:  No apparent distress, Aox3, fra

## 2022-01-31 ENCOUNTER — Encounter: Payer: Self-pay | Admitting: Internal Medicine

## 2022-01-31 ENCOUNTER — Ambulatory Visit (INDEPENDENT_AMBULATORY_CARE_PROVIDER_SITE_OTHER): Payer: Medicare HMO | Admitting: Internal Medicine

## 2022-01-31 VITALS — BP 128/80 | HR 87 | Ht 63.0 in | Wt 179.8 lb

## 2022-01-31 DIAGNOSIS — I340 Nonrheumatic mitral (valve) insufficiency: Secondary | ICD-10-CM

## 2022-01-31 DIAGNOSIS — Z794 Long term (current) use of insulin: Secondary | ICD-10-CM

## 2022-01-31 DIAGNOSIS — E785 Hyperlipidemia, unspecified: Secondary | ICD-10-CM

## 2022-01-31 DIAGNOSIS — E1169 Type 2 diabetes mellitus with other specified complication: Secondary | ICD-10-CM

## 2022-01-31 DIAGNOSIS — E1159 Type 2 diabetes mellitus with other circulatory complications: Secondary | ICD-10-CM

## 2022-01-31 DIAGNOSIS — I639 Cerebral infarction, unspecified: Secondary | ICD-10-CM

## 2022-01-31 DIAGNOSIS — E118 Type 2 diabetes mellitus with unspecified complications: Secondary | ICD-10-CM

## 2022-01-31 DIAGNOSIS — I152 Hypertension secondary to endocrine disorders: Secondary | ICD-10-CM

## 2022-01-31 MED ORDER — LOSARTAN POTASSIUM 25 MG PO TABS: 25.0000 mg | ORAL_TABLET | Freq: Every day | ORAL | 3 refills | Status: DC

## 2022-01-31 NOTE — Patient Instructions (Addendum)
Medication Instructions:  ?Your physician has recommended you make the following change in your medication:  ?1.) losartan 25 mg - one tablet by mouth daily at bedtime ? ?*If you need a refill on your cardiac medications before your next appointment, please call your pharmacy* ? ? ?Lab Work: ?None today. ? ? ?Testing/Procedures:  ?Your physician has requested that you have a TEE. During a TEE, sound waves are used to create images of your heart. It provides your doctor with information about the size and shape of your heart and how well your heart?s chambers and valves are working. In this test, a transducer is attached to the end of a flexible tube that?s guided down your throat and into your esophagus (the tube leading from you mouth to your stomach) to get a more detailed image of your heart. You are not awake for the procedure. Please see the instruction sheet given to you today. For further information please visit [email or web address]. ? ? ?Follow-Up: ?At [facility], you and your health needs are our priority.  As part of our continuing mission to provide you with exceptional heart care, we have created designated Provider Care Teams.  These Care Teams include your primary Cardiologist (physician) and Advanced Practice Providers (APPs -  Physician Assistants and Nurse Practitioners) who all work together to provide you with the care you need, when you need it. ? ?We recommend signing up for the patient portal called "MyChart".  Sign up information is provided on this After Visit Summary.  MyChart is used to connect with patients for Virtual Visits (Telemedicine).  Patients are able to view lab/test results, encounter notes, upcoming appointments, etc.  Non-urgent messages can be sent to your provider as well.   ?To learn more about what you can do with MyChart, go to [email or web address].   ? ?Your next appointment:   ?3 month(s) ? ?The format for your next appointment:   ?In Person ? ?Provider:   ?Early Osmond, MD   ? ? ?TEE INSTRUCTIONS: ?You are scheduled for a TEE on Friday Feb 16, 2022 with Dr. Margaretann Loveless.  Please arrive at the [facility] (Main Entrance A) at [facility]: [address] [place], [place] [address] at 10:00 am. You are allowed ONE visitor in the waiting room during your procedure. Both you and your guest must wear masks. ? ?DIET: Nothing to eat or drink after midnight except a sip of water with medications. ? ?MEDICATION INSTRUCTIONS:    You may take your other medications as directed with sips of water. --TAKE ONLY 1/2 OF NIGHTTIME INSULIN DOSE THE NIGHT BEFORE PROCEDURE ? ?LABS:  For patients receiving anesthesia for TEE: BMET, CBC within 1 week---HOSPITAL DAY OF PROCEDURE ? ?You must have a responsible person to drive you home and stay in the waiting area during your procedure. Failure to do so could result in cancellation. ? ?Interior and spatial designer cards. ? ?*Special Note: Every effort is made to have your procedure done on time. Occasionally there are emergencies that occur at the hospital that may cause delays. Please be patient if a delay does occur.  ? ? ?

## 2022-02-05 NOTE — Addendum Note (Signed)
Addended by: Lenna Sciara on: 02/05/2022 08:44 AM ? ? Modules accepted: Orders ? ?

## 2022-02-09 ENCOUNTER — Encounter (HOSPITAL_COMMUNITY): Payer: Self-pay | Admitting: Internal Medicine

## 2022-02-09 NOTE — Progress Notes (Signed)
Attempted to obtain medical history via telephone, unable to reach at this time. Unable to leave voicemail to return pre surgical testing department's phone call,due to mailbox  not set up.   ?

## 2022-02-09 NOTE — Progress Notes (Signed)
[facility] Summary Report / Loop Recorder 

## 2022-02-14 ENCOUNTER — Telehealth: Payer: Self-pay | Admitting: Internal Medicine

## 2022-02-14 NOTE — Telephone Encounter (Signed)
Patient called stating she is having a TEE this coming Friday 5/12.  She is diabetic, she states she was told to take 1/2 of her insulin the night before her procedure and to be NPO after midnight.  The past two days her blood sugar has been 67 in the morning.  She is concerned because this is low for her.  If she has to wait until 10am for her TEE she will bottom out.   ?She is also confused about the blood work, as the paper work says to go with in one week, and she thinks the doctor told her it would be done on Friday when she got there.   ?

## 2022-02-14 NOTE — Telephone Encounter (Signed)
Called pt in regards to pre-TEE instructions.  Pt reports Blood glucose was 68 yesterday morning 02/13/22 and today 02/14/22 69.  Pt worried that blood glucose could drop morning of TEE and pt instructed not to eat.  Advised pt to only take 1/2 dose of night time insulin.  This would be 30 units.  Also, advised pt to hold metformin and jardiance the morning of TEE.  Advised pt to reach out to Provider that prescribes insulin to notify of blood glucose level drops.  Advised pt to eat a protein rich snack the night before TEE.  Advised pt that labs will be drawn at the hospital on the day procedure.  Pt verbalizes understanding all questions answered.   ?

## 2022-02-16 ENCOUNTER — Ambulatory Visit (HOSPITAL_COMMUNITY): Payer: Medicare HMO

## 2022-02-16 ENCOUNTER — Encounter (HOSPITAL_COMMUNITY)
Admission: RE | Disposition: A | Discharge status: Home / Self Care | Payer: Self-pay | Source: Home / Self Care | Attending: Internal Medicine

## 2022-02-16 ENCOUNTER — Ambulatory Visit (HOSPITAL_COMMUNITY): Payer: Medicare HMO | Admitting: Anesthesiology

## 2022-02-16 ENCOUNTER — Ambulatory Visit (HOSPITAL_COMMUNITY)
Admission: RE | Admit: 2022-02-16 | Discharge: 2022-02-16 | Disposition: A | Discharge status: Home / Self Care | Payer: Medicare HMO | Attending: Internal Medicine | Admitting: Internal Medicine

## 2022-02-16 ENCOUNTER — Other Ambulatory Visit: Payer: Self-pay | Admitting: Physical Medicine and Rehabilitation

## 2022-02-16 ENCOUNTER — Encounter (HOSPITAL_COMMUNITY): Payer: Self-pay | Admitting: Internal Medicine

## 2022-02-16 DIAGNOSIS — I340 Nonrheumatic mitral (valve) insufficiency: Secondary | ICD-10-CM | POA: Diagnosis not present

## 2022-02-16 DIAGNOSIS — Z981 Arthrodesis status: Secondary | ICD-10-CM | POA: Diagnosis not present

## 2022-02-16 DIAGNOSIS — Z539 Procedure and treatment not carried out, unspecified reason: Secondary | ICD-10-CM | POA: Diagnosis not present

## 2022-02-16 DIAGNOSIS — R1310 Dysphagia, unspecified: Secondary | ICD-10-CM | POA: Insufficient documentation

## 2022-02-16 SURGERY — CANCELLED PROCEDURE

## 2022-02-16 MED ORDER — SODIUM CHLORIDE 0.9 % IV SOLN: INTRAVENOUS | Status: DC

## 2022-02-16 NOTE — Progress Notes (Signed)
TEE cancelled due to concerns of cervical neck fusion, dysphagia with history of possible trauma to the epiglottis. Patient was concerned about probe passage without direct visualization. We discussed options and she would like to cancel TEE today. Recommend formal ENT evaluation, discussed with Dr. Ali Lowe. Consider cardiac MRI for quantitation of mitral regurgitation.  ?

## 2022-02-19 ENCOUNTER — Telehealth: Payer: Self-pay | Admitting: *Deleted

## 2022-02-19 DIAGNOSIS — R1310 Dysphagia, unspecified: Secondary | ICD-10-CM

## 2022-02-19 DIAGNOSIS — I340 Nonrheumatic mitral (valve) insufficiency: Secondary | ICD-10-CM

## 2022-02-19 NOTE — Telephone Encounter (Signed)
Referral to [facility] where patient has been seen previously.  Order placed for cardiac MRI to assess MR. ?

## 2022-02-19 NOTE — Telephone Encounter (Signed)
-----   Message from Early Osmond, MD sent at 02/16/2022 12:06 PM EDT ----- ?Regarding: TEE/ENT ?Thanks [place]. ? ?Lucifer Soja, lets refer to ENT re: history of epiglottal tear and needs TEE and CMR re assess MR.  Thaks. ? ? ?----- Message ----- ?From: Elouise Munroe, MD ?Sent: 02/16/2022  11:50 AM EDT ?To: Early Osmond, MD ? ?Pt we discussed. Cancelled TEE for MR.  ? ?

## 2022-02-20 ENCOUNTER — Encounter: Payer: Medicare HMO | Attending: Physical Medicine and Rehabilitation | Admitting: Physical Medicine and Rehabilitation

## 2022-02-20 ENCOUNTER — Encounter: Payer: Self-pay | Admitting: Physical Medicine and Rehabilitation

## 2022-02-20 VITALS — BP 113/75 | HR 80 | Ht 63.0 in | Wt 178.4 lb

## 2022-02-20 DIAGNOSIS — I10 Essential (primary) hypertension: Secondary | ICD-10-CM | POA: Insufficient documentation

## 2022-02-20 DIAGNOSIS — E785 Hyperlipidemia, unspecified: Secondary | ICD-10-CM | POA: Diagnosis present

## 2022-02-20 NOTE — Telephone Encounter (Signed)
Called patient and let her know she has been referred to ENT and that she will be called to schedule cardiac MRI and that she will need lab work within 2 weeks of MRI.  Pt voices understanding and will await calls to schedule.  ?

## 2022-02-20 NOTE — Progress Notes (Signed)
? ?Subjective:  ? ? Patient ID: Connie Campbell, female    DOB: 1951/01/19, 71 y.o.   MRN: [id number] ? ?HPI ?Connie Campbell is a 71 year old woman who presents for hospital follow-up after CIR admission for CVA. ? ?1) CVA ?-has been doing well at home ?-using cane ?-finished her therapy.  ?-can't walk too long.  ?-has a handicap placard.  ?-does not need any medication refills.  ? ?2) leaky Mitral Valve ?-was started on medicine which has really helped with her shortness of breath.  ? ?3) chronic pain ?-on soma, only uses if she has spasms from dystonia and has not needed since she stopped taking the statin.  ? ?4) HTN ?-blood pressure looks great today ? ?Pain Inventory ?Average Pain 6 ?Pain Right Now 6 ?My pain is constant and aching ? ?LOCATION OF PAIN  All over body especially arms and legs ? ?BOWEL ?Number of stools per week: 4-6 ?Oral laxative use Yes  ?Type of laxative Miralax ? ? ?BLADDER ?Normal ? ? ? ?Mobility ?use a cane ?ability to climb steps?  yes ?do you drive?  no ? ?Function ?disabled: date disabled . ?I need assistance with the following:  meal prep, household duties, and shopping ?Do you have any goals in this area?  no ? ?Neuro/Psych ?numbness ?tingling ?trouble walking ?spasms ?dizziness ? ?Prior Studies ?Any changes since last visit?  no ? ?Physicians involved in your care ?Any changes since last visit?  no ? ? ?Family History  ?Problem Relation Age of Onset  ? Heart failure Mother   ?     Family history on mother's side has hereditary heart problems per patient  ? Cancer Mother   ? Atrial fibrillation Mother   ? Cancer Father   ? ?Social History  ? ?Socioeconomic History  ? Marital status: Married  ?  Spouse name: Not on file  ? Number of children: Not on file  ? Years of education: Not on file  ? Highest education level: Not on file  ?Occupational History  ? Occupation: retired  ?Tobacco Use  ? Smoking status: Never  ? Smokeless tobacco: Never  ? Tobacco comments:  ?  3 month lifetime smoker   ?Vaping Use  ? Vaping Use: Never used  ?Substance and Sexual Activity  ? Alcohol use: Yes  ?  Comment: rare  ? Drug use: Never  ? Sexual activity: Not on file  ?Other Topics Concern  ? Not on file  ?Social History Narrative  ? Not on file  ? ?Social Determinants of Health  ? ?Financial Resource Strain: Not on file  ?Food Insecurity: Not on file  ?Transportation Needs: Not on file  ?Physical Activity: Not on file  ?Stress: Not on file  ?Social Connections: Not on file  ? ?Past Surgical History:  ?Procedure Laterality Date  ? ABDOMINAL HYSTERECTOMY    ? APPENDECTOMY    ? 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 CHOLECYSTECTOMY    ? LOOP RECORDER INSERTION N/A 11/15/2021  ? Procedure: LOOP RECORDER INSERTION;  Surgeon: Vickie Epley, MD;  Location: [facility];  Service: Cardiovascular;  Laterality: N/A;  ? ?Past Medical History:  ?Diagnosis Date  ? Bilateral neck pain   ? with tension headaches at times.  ? Celiac disease   ? Chronic pain   ? BUE/BLE and constant.  ? Class 1 obesity due to excess calories with body mass index (BMI) of 31.0 to  31.9 in adult   ? Dyslipidemia   ? Dystonia   ? Glaucoma   ? Hypertension   ? Hypothyroidism (acquired)   ? Migraines   ? Stroke [facility])   ? Trigeminal neuralgia   ? ?BP 113/75   Pulse 80   Ht 5' 3"  (1.6 m)   Wt 178 lb 6.4 oz (80.9 kg)   SpO2 94%   BMI 31.60 kg/m?  ? ?Opioid Risk Score:   ?Fall Risk Score:  `1 ? ?Depression screen PHQ 2/9 ? ? ?  02/20/2022  ?  2:18 PM  ?Depression screen PHQ 2/9  ?Decreased Interest 0  ?Down, Depressed, Hopeless 0  ?PHQ - 2 Score 0  ?Altered sleeping 0  ?Tired, decreased energy 0  ?Change in appetite 0  ?Feeling bad or failure about yourself  0  ?Trouble concentrating 0  ?Moving slowly or fidgety/restless 0  ?Suicidal thoughts 0  ?PHQ-9 Score 0  ?  ? ?Review of Systems  ?Constitutional: Negative.   ?HENT: Negative.    ?Eyes: Negative.   ?Respiratory: Negative.    ?Cardiovascular: Negative.    ?Gastrointestinal: Negative.   ?Endocrine: Negative.   ?Genitourinary: Negative.   ?Musculoskeletal:  Positive for back pain and gait problem.  ?Skin: Negative.   ?Allergic/Immunologic: Negative.   ?Neurological:  Positive for dizziness and numbness.  ?Hematological: Negative.   ?Psychiatric/Behavioral: Negative.    ? ?   ?Objective:  ? Physical Exam ?Gen: no distress, normal appearing ?HEENT: oral mucosa pink and moist, NCAT ?Cardio: Reg rate ?Chest: normal effort, normal rate of breathing ?Abd: soft, non-distended ?Ext: no edema ?Psych: pleasant, normal affect ?Skin: intact ?Neuro: ambulating with cane ? ?   ?Assessment & Plan:  ?HTN: ?-BP is ___today.  ?-Advised checking BP daily at home and logging results to bring into follow-up appointment with PCP and myself. ?-Reviewed BP meds today.  ?-Advised regarding healthy foods that can help lower blood pressure and provided with a list: ?1) citrus foods- high in vitamins and minerals ?2) salmon and other fatty fish - reduces inflammation and oxylipins ?3) swiss chard (leafy green)- high level of nitrates ?4) pumpkin seeds- one of the best natural sources of magnesium ?5) Beans and lentils- high in fiber, magnesium, and potassium ?6) Berries- high in flavonoids ?7) Amaranth (whole grain, can be cooked similarly to rice and oats)- high in magnesium and fiber ?8) Pistachios- even more effective at reducing BP than other nuts ?9) Carrots- high in phenolic compounds that relax blood vessels and reduce inflammation ?10) Celery- contain phthalides that relax tissues of arterial walls ?11) Tomatoes- can also improve cholesterol and reduce risk of heart disease ?12) Broccoli- good source of magnesium, calcium, and potassium ?13) Greek yogurt: high in potassium and calcium ?14) Herbs and spices: Celery seed, cilantro, saffron, lemongrass, black cumin, ginseng, cinnamon, cardamom, sweet basil, and ginger ?15) Chia and flax seeds- also help to lower cholesterol and blood  sugar ?16) Beets- high levels of nitrates that relax blood vessels  ?17) spinach and bananas- high in potassium ? ?-Provided lise of supplements that can help with hypertension:  ?1) magnesium: one high quality brand is Bioptemizers since it contains all 7 types of magnesium, otherwise over the counter magnesium gluconate 447m is a good option ?2) B vitamins ?3) vitamin D ?4) potassium ?5) CoQ10 ?6) L-arginine ?7) Vitamin C ?8) Beetroot ?-Educated that goal BP is 120/80. ?-Made goal to incorporate some of the above foods into diet.   ? ?2) Provided with list of  supplements that can help with dyslipidemia: ?1) Vitamin B3 500-4,055m in divided doses daily (would recommend starting low as can cause uncomfortable facial flushing if started at too high a dose) ?2) Phytosterols 2.15 grams daily ?3) Fermented soy 30-50 grams daily ?4) EGCG (found in green tea): 500-10047mdaily ?5) Omega-3 fatty acids 3000-5,00045maily ?6) Flax seed 40 grams daily ?7) Monounsaturated fats 20-40 grams daily (olives, olive oil, nuts), also reduces cardiovascular disease ?8) Sesame: 40 grams daily ?9) Gamma/delta tocotrienols- a family of unsaturated forms of Vitamin E- 200m29mth dinner ?10) Pantethine 900mg31mly in divided doses ?11) Resveratrol 250mg 17my ?12) N Acetyl Cysteine 2000mg d65m in divided doses ?13) Curcumin 2000-5000mg in63mided doses daily ?14) Pomegranate juice: 8 ounces daily, also helps to lower blood pressure ?15) Pomegranate seeds one cup daily, also helps to lower blood pressure ?16) Citrus Bergamot 1000mg dai44malso helps with glucose control and weight loss ?17) Vitamin C 500mg dail54m8) Quercetin 500-1000mg daily68m) Glutathione ?20) Probiotics 60-100 billion organisms per day ?21) Fiber ?22) Oats ?23) Aged garlic (can eat as food or supplement of 600-900mg per da81m24) Chia seeds 25 grams per day ?25) Lycopene- carotenoid found in high concentrations in tomatoes. ?26) Alpha linolenic acid ?27) Flavonoids  and anthocyanins ?28) Wogonin- flavanoid that enhances reverse cholesterol transport ?29) Coenzyme Q10 ?30) Pantethine- derivative of Vitamin B5: 300mg three t66m per day or 450mg twice pe34my with or without food

## 2022-02-20 NOTE — Patient Instructions (Signed)
HTN: ?-Advised checking BP daily at home and logging results to bring into follow-up appointment with PCP and myself. ?-Reviewed BP meds today.  ?-Advised regarding healthy foods that can help lower blood pressure and provided with a list: ?1) citrus foods- high in vitamins and minerals ?2) salmon and other fatty fish - reduces inflammation and oxylipins ?3) swiss chard (leafy green)- high level of nitrates ?4) pumpkin seeds- one of the best natural sources of magnesium ?5) Beans and lentils- high in fiber, magnesium, and potassium ?6) Berries- high in flavonoids ?7) Amaranth (whole grain, can be cooked similarly to rice and oats)- high in magnesium and fiber ?8) Pistachios- even more effective at reducing BP than other nuts ?9) Carrots- high in phenolic compounds that relax blood vessels and reduce inflammation ?10) Celery- contain phthalides that relax tissues of arterial walls ?11) Tomatoes- can also improve cholesterol and reduce risk of heart disease ?12) Broccoli- good source of magnesium, calcium, and potassium ?13) Greek yogurt: high in potassium and calcium ?14) Herbs and spices: Celery seed, cilantro, saffron, lemongrass, black cumin, ginseng, cinnamon, cardamom, sweet basil, and ginger ?15) Chia and flax seeds- also help to lower cholesterol and blood sugar ?16) Beets- high levels of nitrates that relax blood vessels  ?17) spinach and bananas- high in potassium ? ?-Provided lise of supplements that can help with hypertension:  ?1) magnesium: one high quality brand is Bioptemizers since it contains all 7 types of magnesium, otherwise over the counter magnesium gluconate 480m is a good option ?2) B vitamins ?3) vitamin D ?4) potassium ?5) CoQ10 ?6) L-arginine ?7) Vitamin C ?8) Beetroot ?-Educated that goal BP is 120/80. ?-Made goal to incorporate some of the above foods into diet.   ? ?Provided with list of supplements that can help with dyslipidemia: ?1) Vitamin B3 500-4,0066min divided doses daily  (would recommend starting low as can cause uncomfortable facial flushing if started at too high a dose) ?2) Phytosterols 2.15 grams daily ?3) Fermented soy 30-50 grams daily ?4) EGCG (found in green tea): [phone] ?5) Omega-3 fatty acids 3000-5,000m73mily ?6) Flax seed 40 grams daily ?7) Monounsaturated fats 20-40 grams daily (olives, olive oil, nuts), also reduces cardiovascular disease ?8) Sesame: 40 grams daily ?9) Gamma/delta tocotrienols- a family of unsaturated forms of Vitamin E- 200mg7mh dinner ?10) Pantethine 900mg 36my in divided doses ?11) Resveratrol 250mg d67m ?12) N Acetyl Cysteine 2000mg da35min divided doses ?13) Curcumin 2000-5000mg in 27mded doses daily ?14) Pomegranate juice: 8 ounces daily, also helps to lower blood pressure ?15) Pomegranate seeds one cup daily, also helps to lower blood pressure ?16) Citrus Bergamot 1000mg dail44mlso helps with glucose control and weight loss ?17) Vitamin C 500mg daily42m) Quercetin [phone] daily 107m Glutathione ?20) Probiotics 60-100 billion organisms per day ?21) Fiber ?22) Oats ?23) Aged garlic (can eat as food or supplement of 600-900mg per day45m4) Chia seeds 25 grams per day ?25) Lycopene- carotenoid found in high concentrations in tomatoes. ?26) Alpha linolenic acid ?27) Flavonoids and anthocyanins ?28) Wogonin- flavanoid that enhances reverse cholesterol transport ?29) Coenzyme Q10 ?30) Pantethine- derivative of Vitamin B5: 300mg three ti77mper day or 450mg twice per62m with or without food ?31) Barley and other whole grains ?32) Orange juice ?33) L- carnitine ?34) L- Lysine ?35) L- Arginine ?36) Almonds ?37) Morin ?38) 43tin ?39) Carnosine ?40) Histidine  ?41) Kaempferol  ?42) Organosulfur compounds ?43) Vitamin E ?44) Oleic acid ?45) RBO (ferulic acid gammaoryzanol) ?46) grape seed  extract ?47) Red wine ?48) Berberine HCL 534m daily or twice per day- more effective and with fewer adverse effects that ezetimibe monotherapy ?49)  red yeast rice 2400- 4800 mg/day ?50) chlorella ?51) Licorice  ?

## 2022-02-26 ENCOUNTER — Ambulatory Visit (INDEPENDENT_AMBULATORY_CARE_PROVIDER_SITE_OTHER): Payer: Medicare HMO

## 2022-02-26 DIAGNOSIS — I639 Cerebral infarction, unspecified: Secondary | ICD-10-CM | POA: Diagnosis not present

## 2022-02-27 LAB — CUP PACEART REMOTE DEVICE CHECK
Date Time Interrogation Session: 20230521095514
Implantable Pulse Generator Implant Date: 20230208

## 2022-03-06 ENCOUNTER — Telehealth: Payer: Self-pay | Admitting: Pharmacist

## 2022-03-06 NOTE — Telephone Encounter (Signed)
Patient called stating she accidentally injected Repatha into her finger. She quickly moved to her leg, but doesn't know how much she got. I advised yellow part always goes to your thigh or belly. Gave her phone # for [employer] replacement.

## 2022-03-08 ENCOUNTER — Telehealth: Payer: Self-pay | Admitting: Internal Medicine

## 2022-03-08 NOTE — Telephone Encounter (Signed)
Yes, being one week late for her refill will not be a problem

## 2022-03-08 NOTE — Telephone Encounter (Signed)
Attempted to call the pt back to endorse PharmD's recommendation about late Repatha refill, and she did not answer and voicemail not set up at this time.

## 2022-03-08 NOTE — Telephone Encounter (Signed)
Pt called stating that the medication Repatha will not be available to her for another week and she was told to call for advice as to what to do or if this will be okay until she receives the refill. Please advise.

## 2022-03-09 ENCOUNTER — Telehealth: Payer: Self-pay | Admitting: Internal Medicine

## 2022-03-09 NOTE — Telephone Encounter (Signed)
I spoke with the patient.  She injected incorrectly on 03/07/22 and is unsure how much of the dose she actually got because much of it ran out on the skin.  I reviewed w PharmD who recommends to just stay on track of her usual schedule.  The pt voices understanding and is appreciative for the information.

## 2022-03-09 NOTE — Telephone Encounter (Signed)
error 

## 2022-03-09 NOTE — Telephone Encounter (Signed)
Pt is returning call.  Says to reach her on # [phone].   She also needs advice on how to take her medication currently.

## 2022-03-15 NOTE — Telephone Encounter (Signed)
Received fax for Repatha replacement pen. This has been sent to [facility].

## 2022-03-15 NOTE — Progress Notes (Signed)
[facility] Summary Report / Loop Recorder 

## 2022-03-28 ENCOUNTER — Other Ambulatory Visit: Payer: Medicare HMO

## 2022-03-28 DIAGNOSIS — E1169 Type 2 diabetes mellitus with other specified complication: Secondary | ICD-10-CM

## 2022-03-29 LAB — LIPID PANEL
Chol/HDL Ratio: 2.1 ratio (ref 0.0–4.4)
Cholesterol, Total: 105 mg/dL (ref 100–199)
HDL: 49 mg/dL (ref >39)
LDL Chol Calc (NIH): 29 mg/dL (ref 0–99)
Triglycerides: 166 mg/dL — ABNORMAL HIGH (ref 0–149)
VLDL Cholesterol Cal: 27 mg/dL (ref 5–40)

## 2022-03-29 LAB — APOLIPOPROTEIN B: Apolipoprotein B: 36 mg/dL (ref <90)

## 2022-04-02 ENCOUNTER — Ambulatory Visit (INDEPENDENT_AMBULATORY_CARE_PROVIDER_SITE_OTHER): Payer: Medicare HMO

## 2022-04-02 ENCOUNTER — Encounter: Payer: Self-pay | Admitting: *Deleted

## 2022-04-02 DIAGNOSIS — I639 Cerebral infarction, unspecified: Secondary | ICD-10-CM | POA: Diagnosis not present

## 2022-04-03 LAB — CUP PACEART REMOTE DEVICE CHECK
Date Time Interrogation Session: 20230623095435
Implantable Pulse Generator Implant Date: 20230208

## 2022-04-09 ENCOUNTER — Telehealth (HOSPITAL_COMMUNITY): Payer: Self-pay | Admitting: *Deleted

## 2022-04-09 NOTE — Telephone Encounter (Signed)
Reaching out to patient to offer assistance regarding upcoming cardiac imaging study; pt verbalizes understanding of appt date/time, parking situation and where to check in, and verified current allergies; name and call back number provided for further questions should they arise  Gordy Clement RN [facility] [phone] office [phone] cell  Patient states she will get labs on Wednesday July 5. States she's had an MRI before without incident.

## 2022-04-11 ENCOUNTER — Other Ambulatory Visit: Payer: Medicare HMO | Admitting: *Deleted

## 2022-04-11 DIAGNOSIS — I340 Nonrheumatic mitral (valve) insufficiency: Secondary | ICD-10-CM

## 2022-04-11 LAB — CBC
Hematocrit: 41.3 % (ref 34.0–46.6)
Hemoglobin: 13.5 g/dL (ref 11.1–15.9)
MCH: 27.8 pg (ref 26.6–33.0)
MCHC: 32.7 g/dL (ref 31.5–35.7)
MCV: 85 fL (ref 79–97)
Platelets: 293 10*3/uL (ref 150–450)
RBC: 4.85 x10E6/uL (ref 3.77–5.28)
RDW: 15.1 % (ref 11.7–15.4)
WBC: 8.8 10*3/uL (ref 3.4–10.8)

## 2022-04-12 ENCOUNTER — Other Ambulatory Visit (HOSPITAL_COMMUNITY): Payer: Self-pay | Admitting: Internal Medicine

## 2022-04-12 ENCOUNTER — Ambulatory Visit (HOSPITAL_COMMUNITY)
Admission: RE | Admit: 2022-04-12 | Discharge: 2022-04-12 | Disposition: A | Discharge status: Home / Self Care | Payer: Medicare HMO | Source: Ambulatory Visit | Attending: Internal Medicine | Admitting: Internal Medicine

## 2022-04-12 DIAGNOSIS — I340 Nonrheumatic mitral (valve) insufficiency: Secondary | ICD-10-CM | POA: Diagnosis not present

## 2022-04-12 MED ORDER — GADOBUTROL 1 MMOL/ML IV SOLN
11.0000 mL | Freq: Once | INTRAVENOUS | Status: AC | PRN
  Administered 2022-04-12: 11 mL via INTRAVENOUS

## 2022-04-16 ENCOUNTER — Telehealth: Payer: Self-pay

## 2022-04-16 NOTE — Telephone Encounter (Signed)
Connie Osmond, MD   Let her know that her MRI demonstrated that her mitral regurgitation was only moderate.  She did does have evidence of possibly coronary artery disease.  We will talk about this at follow-up.  Pt called and shared this information with her per Dr. Ali Lowe.  Also made her aware of her f/u appointment on 04/25/2022 at 320 pm.  Pt did not have any questions at this time.

## 2022-04-22 NOTE — Progress Notes (Signed)
Cardiology Office Note:    Date:  04/25/2022   ID:  Connie Campbell, DOB 06-04-1951, MRN [id number]  PCP:  Elinor Parkinson   [facility] Prov[facility] Cardiologist:  Lenna Sciara, MD Referring MD: Sue Lush, PA-C   Chief Complaint/Reason for Referral:  Stroke  ASSESSMENT:    Mitral valve insufficiency, unspecified etiology  Acute CVA (cerebrovascular accident) [facility])  Type 2 diabetes mellitus with complication, with long-term current use of insulin ([place])  Hypertension associated with diabetes ([place])  Hyperlipidemia associated with type 2 diabetes mellitus ([place])  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  Mitral regurgitation: This is only moderate on CMR imaging. 2.  Stroke: Continue Plavix monotherapy.  Bubble study echocardiogram demonstrated no atrial shunts.  LINQ monitor results from June 2023 demonstrate no atrial fibrillation  3.  Type 2 diabetes: Continue Plavix in lieu of aspirin, continue Jardiance, and patient is being managed by pharmacy in regards to [place] therapy. 4.  Hypertension: Blood pressure is well controlled on her current regimen. 5.  Hyperlipidemia: On Repatha as managed by pharmacy. 6.  Myocardial fibrosis: She has a large volume of fibrosis in the inferolateral wall with associated mild hypokinesis.  I will refer her for coronary CTA to evaluate further.  Given her large scar burden I will refer her to EP for recommendations.  She is on losartan currently.   7.  Dyspnea: We will refer for coronary CTA.  Dispo:  Return in about 6 months (around 10/26/2022).     Medication Adjustments/Labs and Tests Ordered: Current medicines are reviewed at length with the patient today.   Concerns regarding medicines are outlined above.   Tests Ordered: Orders Placed This Encounter  Procedures   CT CORONARY MORPH W/CTA COR W/SCORE W/CA W/CM &/OR WO/CM   Ambulatory referral to Cardiac Electrophysiology    Medication Changes: Meds ordered this encounter  Medications   metoprolol tartrate (LOPRESSOR) 100 MG tablet    Sig: Take 1 tablet (100 mg total) by mouth once for 1 dose. Take 90-120 minutes prior to scan.    Dispense:  1 tablet    Refill:  0    History of Present Illness:    FOCUSED PROBLEM LIST:   1.  Hypertension 2.  Hyperlipidemia with statin-induced myalgias; on Repatha managed by pharmacy 3.  History of multiple prior strokes with acute stroke February 2023; no PFO/ASD on TTE March 2023: LINQ monitor without atrial fibrillation 4.  T2DM on insulin 5.  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  Moderate to severe mitral regurgitation TTE March 2023; TEE aborted due to history of epiglottal injury (laryngoscopy July 2023 demonstrated no limitations to TEE; notes in Care Everywhere); CMR 2023 with moderate mitral regurgitation with late gadolinium enhancement of the mid anterolateral and inferolateral walls 7.  Frailty 8.  Chronic pain (possibly fibromyalgia)  March 2023: Patient was seen for initial consultation due to hospital follow-up for stroke.  She is referred for bubble study echocardiogram which demonstrated no PFO or ASD.  It did show moderate to severe mitral regurgitation.  April 2023: The patient complained of increasing shortness of breath.  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  She was then referred for cardiac MRI which demonstrated only moderate mitral regurgitation however there was gadolinium enhancement seen in the  inferolateral walls with associated hypokinesis with an ejection fraction of 56%.  She was also started on losartan due to high blood pressure.  Today: The patient is doing fairly well.  She still suffers from chronic pain that is diffuse in nature.  She denies any chest pain, shortness of breath, palpitations, or paroxysmal nocturnal dyspnea.  She is required no emergency room visits or hospitalizations.  She has had no signs or symptoms of recurrent stroke.  She feels well from a cardiovascular perspective.  Current Medications: Current Meds  Medication Sig   acetaminophen (TYLENOL) 325 MG tablet Take 1-2 tablets (325-650 mg total) by mouth every 4 (four) hours as needed for mild pain.   carisoprodol (SOMA) 350 MG tablet Take 1 tablet (350 mg total) by mouth daily as needed (severe muscle spasms).   clopidogrel (PLAVIX) 75 MG tablet Take 1 tablet (75 mg total) by mouth daily.   empagliflozin (JARDIANCE) 10 MG TABS tablet Take 1 tablet (10 mg total) by mouth daily before breakfast.   Evolocumab (REPATHA SURECLICK) 323 MG/ML SOAJ Inject 1 pen. into the skin every 14 (fourteen) days.   levETIRAcetam (KEPPRA) 750 MG tablet Take 750 mg by mouth 2 (two) times daily.   losartan (COZAAR) 25 MG tablet Take 1 tablet (25 mg total) by mouth at bedtime.   magnesium gluconate (MAGONATE) 500 MG tablet TAKE 1 TABLET BY MOUTH AT BEDTIME.   metFORMIN (GLUCOPHAGE) 1000 MG tablet Take 1 tablet (1,000 mg total) by mouth 2 (two) times daily with a meal.   metoprolol tartrate (LOPRESSOR) 100 MG tablet Take 1 tablet (100 mg total) by mouth once for 1 dose. Take 90-120 minutes prior to scan.   Multiple Vitamin (MULTIVITAMIN) capsule Take 1 capsule by mouth daily.   oxyCODONE-acetaminophen (PERCOCET) 10-325 MG tablet Take 1 tablet by mouth 4 (four) times daily as needed for pain.   OZEMPIC, 0.25 OR 0.5 MG/DOSE, 2 MG/1.5ML SOPN Inject 0.5 mg into the skin once a week. On thursdays   polyvinyl alcohol (LIQUIFILM TEARS)  1.4 % ophthalmic solution Place 1 drop into both eyes as needed for dry eyes.   simethicone (MYLICON) 557 MG chewable tablet Chew 125 mg by mouth every 6 (six) hours as needed for flatulence.   SYNTHROID 100 MCG tablet Take 100 mcg by mouth daily.   timolol (TIMOPTIC) 0.5 % ophthalmic solution Place 1 drop into the right eye 2 (two) times daily.   TOUJEO MAX SOLOSTAR 300 UNIT/ML Solostar Pen Inject into the skin.     Allergies:    Statins, Aspirin, Charentais melon (french melon), Erythromycin base, Ginkgo biloba, Bactrim [sulfamethoxazole-trimethoprim], Beta vulgaris, Ciprofloxacin, Coconut (cocos nucifera), Gluten meal, Keflex [cephalexin], Peanut butter flavor, Penicillins, Tramadol, and Ibuprofen   Social History:   Social History   Tobacco Use   Smoking status: Never   Smokeless tobacco: Never   Tobacco comments:    3 month lifetime smoker  Vaping Use   Vaping Use: Never used  Substance Use Topics   Alcohol use: Yes    Comment: rare   Drug use: Never     Family Hx: Family History  Problem Relation Age of Onset   Heart failure Mother  Family history on mother's side has hereditary heart problems per patient   Cancer Mother    Atrial fibrillation Mother    Cancer Father      Review of Systems:   Please see the history of present illness.    All other systems reviewed and are negative.     EKGs/Labs/Other Test Reviewed:    EKG: EKG done April 2023 which I reviewed demonstrates sinus rhythm  Prior CV studies:  CMR 2023: Moderate mitral regurgitation ejection fraction around 55% with inferolateral late gadolinium enhancement was associated hypokinesis  Linq monitor interrogation June 2023 without atrial fibrillation  TTE 4/23 normal ejection fraction with moderate to severe mitral regurgitation the tip of the posterior leaflet looks relatively calcified.  She has mild MAC mitral annular calcification.  No other significant valvular abnormalities.  Carotid  U/S 2/23: Mild disease bilaterally  Imaging studies that I have independently reviewed today:   MRI Brain 2/23 1. Small acute posterior left frontal lobe infarcts. 2. Chronic ischemia with multiple old cerebral and cerebellar infarcts as above.  MRA Head 2/23 No proximal intracranial vessel occlusion. 2.  Asymmetric poor flow related enhancement of right posterior communicating artery and posterior cerebral artery. May be on a technical basis given lack of acute findings in this distribution on the MRI  Recent Labs: 11/16/2021: ALT 36; BUN 22; Creatinine, Ser 0.76; Potassium 4.1; Sodium 135 04/11/2022: Hemoglobin 13.5; Platelets 293   Recent Lipid Panel Lab Results  Component Value Date/Time   CHOL 105 03/28/2022 07:53 AM   TRIG 166 (H) 03/28/2022 07:53 AM   HDL 49 03/28/2022 07:53 AM   LDLCALC 29 03/28/2022 07:53 AM    Risk Assessment/Calculations:          Physical Exam:    VS:  BP 134/72   Pulse 84   Ht 5' 3.5" (1.613 m)   Wt 180 lb (81.6 kg)   BMI 31.39 kg/m    Wt Readings from Last 3 Encounters:  04/25/22 180 lb (81.6 kg)  02/20/22 178 lb 6.4 oz (80.9 kg)  01/31/22 179 lb 12.8 oz (81.6 kg)    GENERAL:  No apparent distress, Aox3, frail, looks older than her stated age 53:  No carotid bruits, +2 carotid impulses, no scleral icterus CAR: RRR 3 out of 6 holosystolic murmur which increases with expiration RES:  Clear to auscultation bilaterally ABD:  Soft, nontender, nondistended, positive bowel sounds x 4 VASC:  +2 radial pulses, +2 carotid pulses, palpable pedal pulses NEURO:  CN 2-12 grossly intact; motor and sensory grossly intact PSYCH:  No active depression or anxiety EXT:  No edema, ecchymosis, or cyanosis  Signed, Early Osmond, MD  04/25/2022 3:46 PM    [address], [place], [place]  [address] Phone: [phone]; Fax: [phone]   Note:  This document was prepared using Dragon voice recognition  software and may include unintentional dictation errors.

## 2022-04-25 ENCOUNTER — Encounter: Payer: Self-pay | Admitting: Internal Medicine

## 2022-04-25 ENCOUNTER — Ambulatory Visit (INDEPENDENT_AMBULATORY_CARE_PROVIDER_SITE_OTHER): Payer: Medicare HMO | Admitting: Internal Medicine

## 2022-04-25 VITALS — BP 134/72 | HR 84 | Ht 63.5 in | Wt 180.0 lb

## 2022-04-25 DIAGNOSIS — I639 Cerebral infarction, unspecified: Secondary | ICD-10-CM

## 2022-04-25 DIAGNOSIS — E1159 Type 2 diabetes mellitus with other circulatory complications: Secondary | ICD-10-CM

## 2022-04-25 DIAGNOSIS — E785 Hyperlipidemia, unspecified: Secondary | ICD-10-CM

## 2022-04-25 DIAGNOSIS — Z01812 Encounter for preprocedural laboratory examination: Secondary | ICD-10-CM

## 2022-04-25 DIAGNOSIS — E1169 Type 2 diabetes mellitus with other specified complication: Secondary | ICD-10-CM

## 2022-04-25 DIAGNOSIS — I340 Nonrheumatic mitral (valve) insufficiency: Secondary | ICD-10-CM

## 2022-04-25 DIAGNOSIS — I514 Myocarditis, unspecified: Secondary | ICD-10-CM

## 2022-04-25 DIAGNOSIS — E118 Type 2 diabetes mellitus with unspecified complications: Secondary | ICD-10-CM

## 2022-04-25 DIAGNOSIS — R0600 Dyspnea, unspecified: Secondary | ICD-10-CM

## 2022-04-25 DIAGNOSIS — Z794 Long term (current) use of insulin: Secondary | ICD-10-CM

## 2022-04-25 DIAGNOSIS — I152 Hypertension secondary to endocrine disorders: Secondary | ICD-10-CM

## 2022-04-25 MED ORDER — METOPROLOL TARTRATE 100 MG PO TABS: 100.0000 mg | ORAL_TABLET | Freq: Once | ORAL | 0 refills | Status: DC

## 2022-04-25 NOTE — Patient Instructions (Addendum)
Medication Instructions:  No changes *If you need a refill on your cardiac medications before your next appointment, please call your pharmacy*   Lab Work: BMET If you have labs (blood work) drawn today and your tests are completely normal, you will receive your results only by: [place] (if you have MyChart) OR A paper copy in the mail If you have any lab test that is abnormal or we need to change your treatment, we will call you to review the results.   Testing/Procedures: Cardiac CTA - see instructions below   Follow-Up: At [facility], you and your health needs are our priority.  As part of our continuing mission to provide you with exceptional heart care, we have created designated Provider Care Teams.  These Care Teams include your primary Cardiologist (physician) and Advanced Practice Providers (APPs -  Physician Assistants and Nurse Practitioners) who all work together to provide you with the care you need, when you need it.  We recommend signing up for the patient portal called "MyChart".  Sign up information is provided on this After Visit Summary.  MyChart is used to connect with patients for Virtual Visits (Telemedicine).  Patients are able to view lab/test results, encounter notes, upcoming appointments, etc.  Non-urgent messages can be sent to your provider as well.   To learn more about what you can do with MyChart, go to [email or web address].    Your next appointment:   6 month(s)  The format for your next appointment:   In Person  Provider:   Early Osmond, MD     Other Instructions You have been referred to Cardiac Electrophysiology.  We will arrange your appointment.    Your cardiac CT will be scheduled at [facility] [place], [place] [address] [phone]  Please arrive at the [facility] and Children's Entrance (Entrance C2) of [facility] 30 minutes prior to test start time. You can use the FREE valet  parking offered at entrance C (encouraged to control the heart rate for the test)  Proceed to the [facility] Radiology Department (first floor) to check-in and test prep.  All radiology patients and guests should use entrance C2 at [facility], accessed from [facility], even though the hospital's physical address listed is [address]    Please follow these instructions carefully (unless otherwise directed):   On the Night Before the Test: Be sure to Drink plenty of water. Do not consume any caffeinated/decaffeinated beverages or chocolate 12 hours prior to your test. Do not take any antihistamines 12 hours prior to your test.  On the Day of the Test: Drink plenty of water until 1 hour prior to the test. Do not eat any food 4 hours prior to the test. You may take your regular medications prior to the test.  Take metoprolol (Lopressor) two hours prior to test.. FEMALES- please wear underwire-free bra if available, avoid dresses & tight clothing       After the Test: Drink plenty of water. After receiving IV contrast, you may experience a mild flushed feeling. This is normal. On occasion, you may experience a mild rash up to 24 hours after the test. This is not dangerous. If this occurs, you can take Benadryl 25 mg and increase your fluid intake. If you experience trouble breathing, this can be serious. If it is severe call 911 IMMEDIATELY. If it is mild, please call our office. If you take any of these medications:  Glipizide/Metformin, Avandament, Glucavance, please do not take 48 hours after completing test unless otherwise instructed.  We will call to schedule your test 2-4 weeks out understanding that some insurance companies will need an authorization prior to the service being performed.   For non-scheduling related questions, please contact the cardiac imaging nurse navigator should you have any questions/concerns: Marchia Bond, Cardiac Imaging  Nurse Navigator Gordy Clement, Cardiac Imaging Nurse Navigator [facility] Direct Office Dial: [phone]   For scheduling needs, including cancellations and rescheduling, please call [place], [phone].

## 2022-04-25 NOTE — Addendum Note (Signed)
Addended by: Rodman Key on: 04/25/2022 04:08 PM   Modules accepted: Orders

## 2022-04-26 LAB — BASIC METABOLIC PANEL
BUN/Creatinine Ratio: 22 (ref 12–28)
BUN: 20 mg/dL (ref 8–27)
CO2: 23 mmol/L (ref 20–29)
Calcium: 9.6 mg/dL (ref 8.7–10.3)
Chloride: 103 mmol/L (ref 96–106)
Creatinine, Ser: 0.92 mg/dL (ref 0.57–1.00)
Glucose: 215 mg/dL — ABNORMAL HIGH (ref 70–99)
Potassium: 4.7 mmol/L (ref 3.5–5.2)
Sodium: 140 mmol/L (ref 134–144)
eGFR: 67 mL/min/{1.73_m2} (ref >59)

## 2022-04-26 NOTE — Progress Notes (Signed)
[facility] Summary Report / Loop Recorder 

## 2022-04-27 NOTE — Therapy (Deleted)
OUTPATIENT SPEECH LANGUAGE PATHOLOGY SWALLOW EVALUATION   Patient Name: Connie Campbell MRN: [id number] DOB:07/03/1951, 71 y.o., female Today's Date: 04/27/2022  PCP: Tereasa Coop, MD REFERRING PROVIDER: Ebbie Latus, DO    Past Medical History:  Diagnosis Date   Bilateral neck pain    with tension headaches at times.   Celiac disease    Chronic pain    BUE/BLE and constant.   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, residual speech deficit and dysphagia   CHOLECYSTECTOMY     LOOP RECORDER INSERTION N/A 11/15/2021   Procedure: LOOP RECORDER INSERTION;  Surgeon: Vickie Epley, MD;  Location: [place] [facility];  Service: Cardiovascular;  Laterality: N/A;   Patient Active Problem List   Diagnosis Date Noted   Type 2 diabetes mellitus ([place]) 11/20/2021   Cervical dystonia 11/20/2021   Ischemic stroke of frontal lobe ([place]) 11/15/2021   Acute CVA (cerebrovascular accident) ([place]) 11/09/2021   Hypertension 11/09/2021   Dyslipidemia 11/09/2021   Hypothyroidism (acquired) 11/09/2021   Class 1 obesity due to excess calories with body mass index (BMI) of 31.0 to 31.9 in adult 11/09/2021   Chronic pain disorder 11/09/2021   Seizure disorder as sequela of cerebrovascular accident ([place]) 11/09/2021    ONSET DATE: referral 04/24/2022   REFERRING DIAG: R13.10 (ICD-10-CM) - Dysphagia, unspecified  THERAPY DIAG:  No diagnosis found.  Rationale for Evaluation and Treatment Rehabilitation  SUBJECTIVE:   SUBJECTIVE STATEMENT: *** Pt accompanied by: {accompnied:27141}  PERTINENT HISTORY: Patient has history of hoarseness and dysphagia, which has  been present for several years. She was previously seen by Dr. Fredric Dine in 2022, and was that at that time referred to Dr. Mila Homer for further evaluation due to her focal complaints. She underwent video stroboscopy on 01/24/2021, and at that time was noted to have acquired velopharyngeal insufficiency, dysphonia, laryngeal spasm, muscle tension dysphonia as well as age-related vocal fold atrophy. A modified barium swallow study was recommended due to her complaints of dysphagia, but never performed. Patient was also referred to speech-language pathology for consideration of voice therapy, but did not follow through with therapy.  PAIN:  Are you having pain? {OPRCPAIN:27236}  FALLS: Has patient fallen in last 6 months?  {FMBWGYKZ:99357}  LIVING ENVIRONMENT: Lives with: {OPRC lives with:25569::"lives with their family"} Lives in: {Lives in:25570}  PLOF:  Level of assistance: {SVXBLTJ:03009} Employment: {SLPemployment:25674}   PATIENT GOALS ***  OBJECTIVE:   DIAGNOSTIC FINDINGS: 04/17/2022 Flexible laryngoscopy shows patent anterior nasal cavity with minimal crusting, no discharge or infection. Dried crusting noted along posterior pharyngeal wall. Normal base of tongue and supraglottis. Normal vocal cord mobility without vocal cord nodule, mass, polyp or tumor. Age-related vocal fold atrophy noted. Hypopharynx normal without mass, pooling of secretions or aspiration.  RECOMMENDATIONS FROM OBJECTIVE SWALLOW STUDY (MBSS/FEES):  *** Objective swallow impairments: *** Objective recommended compensations: ***  COGNITION: Overall cognitive status: {cognition:24006} Areas of impairment:  {cognitiveimpairmentslp:27409} Functional deficits: ***  ORAL MOTOR EXAMINATION Overall status: {OMESLP2:27645} Comments: ***  CLINICAL SWALLOW ASSESSMENT:   Current diet: {slpdiet:27196} Dentition: {dentition:27197} Patient  directly observed with POs: {POobserved:27199} Feeding: {slp  feeding:27200} Liquids provided by: {SLPliquids:27201} Oral phase signs and symptoms: {SLPoralphase:27202} Pharyngeal phase signs and symptoms: {SLPpharyngealphase:27203}    PATIENT REPORTED OUTCOME MEASURES (PROM): {SLPPROM:27095}   TODAY'S TREATMENT:  ***   PATIENT EDUCATION: Education details: *** Person educated: {Person educated:25204} Education method: {Education Method:25205} Education comprehension: {Education Comprehension:25206}   ASSESSMENT:  CLINICAL IMPRESSION: Patient is a 71 y.o. F who was seen today for dysphagia upon referral from Dr. Fredric Dine. Pt c/o ***. SLP performs oral mechanism exam which was ***. Administration of *** PO, with pt self-feeding. Demonstrates ***. Based on evaluation findings, I recommend MBSS to objectively assess pt swallow function, efficacy, and safety. Skilled ST is indicated to optimize ***.  OBJECTIVE IMPAIRMENTS include {SLPOBJIMP:27107}. These impairments are limiting patient from {SLPLIMIT:27108}. Factors affecting potential to achieve goals and functional outcome are {SLP potential:25450}. Patient will benefit from skilled SLP services to address above impairments and improve overall function.  REHAB POTENTIAL: {rehabpotential:25112}   GOALS: Goals reviewed with patient? {yes/no:20286}  SHORT TERM GOALS: Target date: {follow up:25551}  (Remove Blue Hyperlink)  *** Baseline: Goal status: {GOALSTATUS:25110}  2.  *** Baseline:  Goal status: {GOALSTATUS:25110}  3.  *** Baseline:  Goal status: {GOALSTATUS:25110}  4.  *** Baseline:  Goal status: {GOALSTATUS:25110}  5.  *** Baseline:  Goal status: {GOALSTATUS:25110}  6.  *** Baseline:  Goal status: {GOALSTATUS:25110}  LONG TERM GOALS: Target date: {follow up:25551}  (Remove Blue Hyperlink)  *** Baseline:  Goal status: {GOALSTATUS:25110}  2.  *** Baseline:  Goal status: {GOALSTATUS:25110}  3.  *** Baseline:  Goal status: {GOALSTATUS:25110}  4.   *** Baseline:  Goal status: {GOALSTATUS:25110}  5.  *** Baseline:  Goal status: {GOALSTATUS:25110}  6.  *** Baseline:  Goal status: {GOALSTATUS:25110}  PLAN: SLP FREQUENCY: {rehab frequency:25116}  SLP DURATION: {rehab duration:25117}  PLANNED INTERVENTIONS: {SLP treatment/interventions:25449}    Su Monks, CCC-SLP 04/27/2022, 10:09 AM

## 2022-05-01 ENCOUNTER — Ambulatory Visit: Payer: Medicare HMO | Admitting: Speech Pathology

## 2022-05-03 LAB — CUP PACEART REMOTE DEVICE CHECK
Date Time Interrogation Session: 20230726095408
Implantable Pulse Generator Implant Date: 20230208

## 2022-05-07 ENCOUNTER — Ambulatory Visit (INDEPENDENT_AMBULATORY_CARE_PROVIDER_SITE_OTHER): Payer: Medicare HMO

## 2022-05-07 DIAGNOSIS — I639 Cerebral infarction, unspecified: Secondary | ICD-10-CM | POA: Diagnosis not present

## 2022-05-18 ENCOUNTER — Encounter (HOSPITAL_COMMUNITY): Payer: Self-pay | Admitting: Emergency Medicine

## 2022-05-29 ENCOUNTER — Telehealth (HOSPITAL_COMMUNITY): Payer: Self-pay | Admitting: *Deleted

## 2022-05-29 NOTE — Telephone Encounter (Signed)
Patient returning call regarding upcoming cardiac imaging study; pt verbalizes understanding of appt date/time, parking situation and where to check in, pre-test NPO status and medications ordered, and verified current allergies; name and call back number provided for further questions should they arise  Gordy Clement RN Navigator Cardiac [place] and Vascular [phone] office [phone] cell  Patient to take 196m metoprolol tartrate two hours prior to her cardiac CT scan. She is aware to arrive at 8:30am.

## 2022-05-29 NOTE — Telephone Encounter (Signed)
Attempted to call patient regarding upcoming cardiac CT appointment. Unable to leave a message.  Gordy Clement RN Navigator Cardiac Imaging [facility] [phone] Office [phone] Cell

## 2022-05-30 ENCOUNTER — Ambulatory Visit (HOSPITAL_COMMUNITY)
Admission: RE | Admit: 2022-05-30 | Discharge: 2022-05-30 | Disposition: A | Discharge status: Home / Self Care | Payer: Medicare HMO | Source: Ambulatory Visit | Attending: Internal Medicine | Admitting: Internal Medicine

## 2022-05-30 ENCOUNTER — Encounter (HOSPITAL_COMMUNITY): Payer: Self-pay | Admitting: Radiology

## 2022-05-30 ENCOUNTER — Encounter (HOSPITAL_COMMUNITY): Payer: Self-pay

## 2022-05-30 DIAGNOSIS — R0600 Dyspnea, unspecified: Secondary | ICD-10-CM | POA: Diagnosis present

## 2022-05-30 MED ORDER — METOPROLOL TARTRATE 5 MG/5ML IV SOLN
INTRAVENOUS | Status: AC
  Administered 2022-05-30: 5 mg
  Filled 2022-05-30: qty 20

## 2022-05-30 MED ORDER — DILTIAZEM HCL 25 MG/5ML IV SOLN
INTRAVENOUS | Status: AC
  Administered 2022-05-30: 5 mg
  Filled 2022-05-30: qty 5

## 2022-05-30 MED ORDER — NITROGLYCERIN 0.4 MG SL SUBL: 0.8000 mg | SUBLINGUAL_TABLET | Freq: Once | SUBLINGUAL | Status: DC

## 2022-05-30 MED ORDER — DILTIAZEM HCL 25 MG/5ML IV SOLN
5.0000 mg | INTRAVENOUS | Status: DC | PRN
Start: 2022-05-30 — End: 2022-05-31
  Administered 2022-05-30 (×2): 5 mg via INTRAVENOUS

## 2022-05-30 MED ORDER — METOPROLOL TARTRATE 5 MG/5ML IV SOLN
INTRAVENOUS | Status: AC
  Filled 2022-05-30: qty 5

## 2022-05-30 MED ORDER — METOPROLOL TARTRATE 5 MG/5ML IV SOLN
5.0000 mg | INTRAVENOUS | Status: DC | PRN
  Administered 2022-05-30 (×3): 5 mg via INTRAVENOUS

## 2022-05-30 NOTE — Progress Notes (Signed)
Patient brought to Radiology Nurses Station in wheelchair as sitting in one in waiting area. Patient forgot to take her Metoprolol 135m this am. Heart rate sinus rhythm 77. Given 157mof IV Metoprolol and 1574mf Diltiazem and heart rate came down to 72. Called [place] CT tech to see how to proceed. She wanted me to call Dr ChrHarrell Gaveich was done. Dr ChrHarrell Gaveked me to give one more 5mg75mse of Metoprolol and then scan [facility]. [facility], she had 1 minute of SOB with either CT scan or MRI 20 years ago. [place] call Dr ChriHarrell Gave test cancelled and to be rescheduled with pre meds. Patient discharged in wheelchair, husband present.

## 2022-05-30 NOTE — Progress Notes (Signed)
Pt arrived to CT for a CT coronary scan with IV contrast.  During pre-screening questioning, pt stated "20 years ago she had contrast during a scan and than developed shortness of breath afterward."  She believes it was during a CT scan.  Pt has no previous CT's with IV contrast in our system to be able to verify allergy status.  I spoke with Dr Harrell Gave and it was decided it would be best to abort the pt's scan today for her safety.  Will notify Cardiac Nurse navigators that pt needs further investigation and to be rescheduled.  If unable to verify pt's allergy status, pt will require pre-medication for potential contrast dye allergy.

## 2022-06-01 ENCOUNTER — Telehealth: Payer: Self-pay | Admitting: Internal Medicine

## 2022-06-01 DIAGNOSIS — Z91041 Radiographic dye allergy status: Secondary | ICD-10-CM

## 2022-06-01 NOTE — Telephone Encounter (Signed)
Pt is calling back to see when she will be rescheduled for the cardiac imaging study. She states she was allergic to the dye, so they had to cancel. Please advise.

## 2022-06-04 ENCOUNTER — Other Ambulatory Visit (HOSPITAL_COMMUNITY): Payer: Self-pay | Admitting: Emergency Medicine

## 2022-06-04 DIAGNOSIS — R079 Chest pain, unspecified: Secondary | ICD-10-CM

## 2022-06-04 DIAGNOSIS — Z91041 Radiographic dye allergy status: Secondary | ICD-10-CM

## 2022-06-04 MED ORDER — DIPHENHYDRAMINE HCL 50 MG PO TABS: ORAL_TABLET | ORAL | 0 refills | Status: DC

## 2022-06-04 MED ORDER — IVABRADINE HCL 5 MG PO TABS: 15.0000 mg | ORAL_TABLET | Freq: Once | ORAL | 0 refills | Status: AC

## 2022-06-04 MED ORDER — PREDNISONE 50 MG PO TABS: ORAL_TABLET | ORAL | 0 refills | Status: DC

## 2022-06-04 NOTE — Progress Notes (Signed)
73m ivabradine + 13 hr prep ordered (prednisone/benadryl)  SMarchia BondRN Navigator Cardiac [facility] [phone]  [phone]

## 2022-06-07 NOTE — Progress Notes (Signed)
[facility] Summary Report / Loop Recorder 

## 2022-06-11 NOTE — Progress Notes (Deleted)
Electrophysiology Office Note:    Date:  06/11/2022   ID:  Connie Campbell, DOB June 02, 1951, MRN [id number]  PCP:  Sue Lush, PA-C  [facility] Cardiologist:  Early Osmond, MD  [facility] Electrophysiologist:  None   Referring MD: Early Osmond, MD   Chief Complaint: myocardial fibrosis  History of Present Illness:    Connie Campbell is a 71 y.o. female who presents for an evaluation of myocardial fibrosis at the request of Dr Connie Campbell. Their medical history includes CVA, DM, HTN, HLD. The patient was last seen by Dr Connie Campbell 04/25/2022. She has moderate MR by cMR.  During cMR, a large burden of myocardial fibrosis was detected in the inferolateral wall associated with mild hypokinesis. A coronary CTA was ordered to further evaluate this.     Past Medical History:  Diagnosis Date   Bilateral neck pain    with tension headaches at times.   Celiac disease    Chronic pain    BUE/BLE and constant.   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     Allergies:   Iodinated contrast media, Statins, Aspirin, Charentais melon (french melon), Erythromycin base, Ginkgo biloba, Bactrim [sulfamethoxazole-trimethoprim], Beta vulgaris, Ciprofloxacin, Coconut (cocos  nucifera), Gluten meal, Keflex [cephalexin], Peanut butter flavor, Penicillins, Tramadol, and Ibuprofen   Social History   Socioeconomic History   Marital status: Married    Spouse name: Not on file   Number of children: Not on file   Years of education: Not on file   Highest education level: Not on file  Occupational History   Occupation: retired  Tobacco Use   Smoking status: Never   Smokeless tobacco: Never   Tobacco comments:    3 month lifetime smoker  Vaping Use   Vaping Use: Never used  Substance and Sexual Activity   Alcohol use: Yes    Comment: rare   Drug use: Never   Sexual activity: Not on file  Other Topics Concern   Not on file  Social History Narrative   Not on file   Social Determinants of Health   Financial Resource Strain: Not on file  Food Insecurity: Not on file  Transportation Needs: Not on file  Physical Activity: Not on file  Stress: Not on file  Social Connections: Not on file     Family History: The patient's family history includes Atrial fibrillation in her mother; Cancer in her father and mother; Heart failure in her mother.  ROS:   Please see the history of present illness.    All other systems reviewed and are negative.  EKGs/Labs/Other Studies Reviewed:    The following studies were reviewed today:  04/13/2022 cMR IMPRESSION: LVEF 56% with basal inferolateral hypokinesis, elevated ECV and LGE; this can be seen in prior left circumflex branch disease, less frequently in inflammatory disease.  Moderate mitral regurgitation as described above.   12/26/2021 Echo  1. Left ventricular ejection fraction, by estimation, is 55 to 60%. The  left ventricle has normal function. The left ventricle has no regional  wall motion abnormalities. Left ventricular diastolic parameters are  consistent with Grade II diastolic  dysfunction (pseudonormalization).   2. Right ventricular systolic function is normal. The right ventricular  size is  normal. There is normal pulmonary artery systolic pressure. The  estimated right ventricular systolic pressure is 16.5 mmHg.   3. Left atrial size was mildly dilated.   4. The mitral valve is abnormal. Moderate to severe mitral valve  regurgitation, cannot rule out partial flail but not clearly visualized.  Consider TEE. No evidence of mitral stenosis.   5. The aortic valve is tricuspid. There is mild calcification of the  aortic valve. Aortic valve regurgitation is trivial. Aortic valve  sclerosis is present, with no evidence of aortic valve stenosis.   6. The inferior vena cava is normal in size with greater than 50%  respiratory variability, suggesting right atrial pressure of 3 mmHg.   7. No PFO or ASD by bubble study.     Recent Labs: 11/16/2021: ALT 36 04/11/2022: Hemoglobin 13.5; Platelets 293 04/25/2022: BUN 20; Creatinine, Ser 0.92; Potassium 4.7; Sodium 140  Recent Lipid Panel    Component Value Date/Time   CHOL 105 03/28/2022 0753   TRIG 166 (H) 03/28/2022 0753   HDL 49 03/28/2022 0753   CHOLHDL 2.1 03/28/2022 0753   CHOLHDL 4.2 11/10/2021 0429   VLDL 33 11/10/2021 0429   LDLCALC 29 03/28/2022 0753    Physical Exam:    VS:  There were no vitals taken for this visit.    Wt Readings from Last 3 Encounters:  04/25/22 180 lb (81.6 kg)  02/20/22 178 lb 6.4 oz (80.9 kg)  01/31/22 179 lb 12.8 oz (81.6 kg)     GEN: *** Well nourished, well developed in no acute distress HEENT: Normal NECK: No JVD; No carotid bruits LYMPHATICS: No lymphadenopathy CARDIAC: ***RRR, no murmurs, rubs, gallops RESPIRATORY:  Clear to auscultation without rales, wheezing or rhonchi  ABDOMEN: Soft, non-tender, non-distended MUSCULOSKELETAL:  No edema; No deformity  SKIN: Warm and dry NEUROLOGIC:  Alert and oriented x 3 PSYCHIATRIC:  Normal affect       ASSESSMENT:    No diagnosis found. PLAN:    In order of problems listed above:   Need CTA coronary ?syncopal  history       Total time spent with patient today *** minutes. This includes reviewing records, evaluating the patient and coordinating care.  Medication Adjustments/Labs and Tests Ordered: Current medicines are reviewed at length with the patient today.  Concerns regarding medicines are outlined above.  No orders of the defined types were placed in this encounter.  No orders of the defined types were placed in this encounter.    Signed, Hilton Cork. Quentin Ore, MD, [facility], [facility] 06/11/2022 6:57 PM    Electrophysiology [facility]

## 2022-06-12 ENCOUNTER — Encounter: Payer: Medicare HMO | Admitting: Cardiology

## 2022-06-12 ENCOUNTER — Ambulatory Visit (INDEPENDENT_AMBULATORY_CARE_PROVIDER_SITE_OTHER): Payer: Medicare Other

## 2022-06-12 DIAGNOSIS — I639 Cerebral infarction, unspecified: Secondary | ICD-10-CM

## 2022-06-12 DIAGNOSIS — I514 Myocarditis, unspecified: Secondary | ICD-10-CM

## 2022-06-12 DIAGNOSIS — I10 Essential (primary) hypertension: Secondary | ICD-10-CM

## 2022-06-12 LAB — CUP PACEART REMOTE DEVICE CHECK
Date Time Interrogation Session: 20230905113652
Implantable Pulse Generator Implant Date: 20230208

## 2022-06-13 ENCOUNTER — Other Ambulatory Visit: Payer: Self-pay | Admitting: Internal Medicine

## 2022-06-13 MED ORDER — EMPAGLIFLOZIN 10 MG PO TABS: 10.0000 mg | ORAL_TABLET | Freq: Every day | ORAL | 3 refills | Status: DC

## 2022-06-14 MED ORDER — PREDNISONE 50 MG PO TABS: ORAL_TABLET | ORAL | 0 refills | Status: DC

## 2022-06-14 NOTE — Telephone Encounter (Signed)
Resending prednisone to pharm since patient lost original.   Reviewed timing of medications Prednisone 13, 7, 1 hr prior to scan Corlanor 2 hr prior to scan Benadryl 1 hr prior to scan  [place] [facility] [phone] Office  [phone] Cell

## 2022-06-19 ENCOUNTER — Telehealth (HOSPITAL_COMMUNITY): Payer: Self-pay | Admitting: Emergency Medicine

## 2022-06-19 NOTE — Telephone Encounter (Signed)
Reaching out to patient to offer assistance regarding upcoming cardiac imaging study; pt verbalizes understanding of appt date/time, parking situation and where to check in, pre-test NPO status and medications ordered, and verified current allergies; name and call back number provided for further questions should they arise Marchia Bond RN Navigator Cardiac Imaging [facility] [phone] office [phone] cell  Explained to patient that she can take all daily meds per usual, adding prednisone 13 hr, 7hr, 1 hr + benadryl 1 hr prior to scan + 17m ivabradine 2 hr prior to scan, holding metformin 48 hr post scan.  Pt verbalized understanding to arrive for scan 30 mins early (3:30pm)  SClarise Cruz

## 2022-06-19 NOTE — Telephone Encounter (Signed)
Unable to leave vm [place] [facility] [phone] Office  [phone] Cell

## 2022-06-20 ENCOUNTER — Telehealth: Payer: Self-pay | Admitting: Cardiology

## 2022-06-20 ENCOUNTER — Ambulatory Visit: Payer: Medicare HMO | Admitting: Internal Medicine

## 2022-06-20 ENCOUNTER — Ambulatory Visit (HOSPITAL_COMMUNITY)
Admission: RE | Admit: 2022-06-20 | Discharge: 2022-06-20 | Disposition: A | Discharge status: Home / Self Care | Payer: Medicare Other | Source: Ambulatory Visit | Attending: Internal Medicine | Admitting: Internal Medicine

## 2022-06-20 DIAGNOSIS — I2510 Atherosclerotic heart disease of native coronary artery without angina pectoris: Secondary | ICD-10-CM | POA: Diagnosis not present

## 2022-06-20 DIAGNOSIS — R0600 Dyspnea, unspecified: Secondary | ICD-10-CM | POA: Insufficient documentation

## 2022-06-20 DIAGNOSIS — I700 Atherosclerosis of aorta: Secondary | ICD-10-CM | POA: Diagnosis not present

## 2022-06-20 MED ORDER — METOPROLOL TARTRATE 5 MG/5ML IV SOLN
INTRAVENOUS | Status: AC
  Filled 2022-06-20: qty 20

## 2022-06-20 MED ORDER — NITROGLYCERIN 0.4 MG SL SUBL
SUBLINGUAL_TABLET | SUBLINGUAL | Status: AC
  Administered 2022-06-20: 0.8 mg via SUBLINGUAL
  Filled 2022-06-20: qty 2

## 2022-06-20 MED ORDER — NITROGLYCERIN 0.4 MG SL SUBL: 0.8000 mg | SUBLINGUAL_TABLET | Freq: Once | SUBLINGUAL | Status: AC

## 2022-06-20 MED ORDER — DILTIAZEM HCL 25 MG/5ML IV SOLN: 10.0000 mg | INTRAVENOUS | Status: DC | PRN

## 2022-06-20 MED ORDER — DILTIAZEM HCL 25 MG/5ML IV SOLN
INTRAVENOUS | Status: AC
  Administered 2022-06-20: 10 mg via INTRAVENOUS
  Filled 2022-06-20: qty 5

## 2022-06-20 MED ORDER — METOPROLOL TARTRATE 5 MG/5ML IV SOLN
10.0000 mg | INTRAVENOUS | Status: DC | PRN
  Administered 2022-06-20: 10 mg via INTRAVENOUS

## 2022-06-20 MED ORDER — IOHEXOL 350 MG/ML SOLN
95.0000 mL | Freq: Once | INTRAVENOUS | Status: AC | PRN
  Administered 2022-06-20: 95 mL via INTRAVENOUS

## 2022-06-20 NOTE — Telephone Encounter (Signed)
Okay to fly per MD Quentin Ore and [place].  Verbalized understanding.

## 2022-06-20 NOTE — Telephone Encounter (Signed)
  Pt would like to know if it safe for her to fly, they are leaving on 09/21 and coming back on 10/02

## 2022-07-04 NOTE — Progress Notes (Signed)
[facility] Summary Report / Loop Recorder 

## 2022-07-16 ENCOUNTER — Ambulatory Visit (INDEPENDENT_AMBULATORY_CARE_PROVIDER_SITE_OTHER): Payer: Medicare Other

## 2022-07-16 DIAGNOSIS — I639 Cerebral infarction, unspecified: Secondary | ICD-10-CM | POA: Diagnosis not present

## 2022-07-16 LAB — CUP PACEART REMOTE DEVICE CHECK
Date Time Interrogation Session: 20230930095153
Implantable Pulse Generator Implant Date: 20230208

## 2022-07-25 NOTE — Progress Notes (Signed)
[facility] Summary Report / Loop Recorder 

## 2022-07-31 ENCOUNTER — Telehealth: Payer: Self-pay | Admitting: Pharmacist

## 2022-07-31 NOTE — Telephone Encounter (Signed)
PA approved through 01/30/23

## 2022-07-31 NOTE — Telephone Encounter (Signed)
PA renewal submitted Key: [id number]

## 2022-08-01 ENCOUNTER — Encounter: Payer: Medicare Other | Admitting: Cardiology

## 2022-08-01 NOTE — Progress Notes (Deleted)
Electrophysiology Office Follow up Visit Note:    Date:  08/01/2022   ID:  Connie Campbell, DOB 1950-12-19, MRN [id number]  PCP:  Connie Campbell, [facility] Cardiologist:  Connie Osmond, MD  [facility] Electrophysiologist:  Connie Epley, MD    Interval History:    Connie Campbell is a 71 y.o. female who presents for a follow up visit. They were last seen in clinic ***. Since their last appointment, ***       Past Medical History:  Diagnosis Date   Bilateral neck pain    with tension headaches at times.   Celiac disease    Chronic pain    BUE/BLE and constant.   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     Allergies:   Coconut (cocos nucifera), Iodinated contrast media, Keflex [cephalexin], Penicillins, Statins, Aspirin, Bactrim [sulfamethoxazole-trimethoprim], Charentais melon (french melon), Erythromycin base, Ginkgo biloba, Gluten meal, Peanut butter flavor, Tramadol, Beta vulgaris, Ciprofloxacin, and Ibuprofen   Social History   Socioeconomic History   Marital status: Married    Spouse name: Not on file   Number of children: Not on file   Years of education: Not on file   Highest education level: Not on file  Occupational  History   Occupation: retired  Tobacco Use   Smoking status: Never   Smokeless tobacco: Never   Tobacco comments:    3 month lifetime smoker  Vaping Use   Vaping Use: Never used  Substance and Sexual Activity   Alcohol use: Yes    Comment: rare   Drug use: Never   Sexual activity: Not on file  Other Topics Concern   Not on file  Social History Narrative   Not on file   Social Determinants of Health   Financial Resource Strain: Not on file  Food Insecurity: Not on file  Transportation Needs: Not on file  Physical Activity: Not on file  Stress: Not on file  Social Connections: Not on file     Family History: The patient's family history includes Atrial fibrillation in her mother; Cancer in her father and mother; Heart failure in her mother.  ROS:   Please see the history of present illness.    All other systems reviewed and are negative.  EKGs/Labs/Other Studies Reviewed:    The following studies were reviewed today: ***  EKG:  The ekg ordered today demonstrates ***  Recent Labs: 11/16/2021: ALT 36 04/11/2022: Hemoglobin 13.5; Platelets 293 04/25/2022: BUN 20; Creatinine, Ser 0.92; Potassium 4.7; Sodium 140  Recent Lipid Panel    Component Value Date/Time   CHOL 105 03/28/2022 0753   TRIG 166 (H) 03/28/2022  0753   HDL 49 03/28/2022 0753   CHOLHDL 2.1 03/28/2022 0753   CHOLHDL 4.2 11/10/2021 0429   VLDL 33 11/10/2021 0429   LDLCALC 29 03/28/2022 0753    Physical Exam:    VS:  There were no vitals taken for this visit.    Wt Readings from Last 3 Encounters:  04/25/22 180 lb (81.6 kg)  02/20/22 178 lb 6.4 oz (80.9 kg)  01/31/22 179 lb 12.8 oz (81.6 kg)     GEN: *** Well nourished, well developed in no acute distress HEENT: Normal NECK: No JVD; No carotid bruits LYMPHATICS: No lymphadenopathy CARDIAC: ***RRR, no murmurs, rubs, gallops RESPIRATORY:  Clear to auscultation without rales, wheezing or rhonchi  ABDOMEN: Soft, non-tender,  non-distended MUSCULOSKELETAL:  No edema; No deformity  SKIN: Warm and dry NEUROLOGIC:  Alert and oriented x 3 PSYCHIATRIC:  Normal affect        ASSESSMENT:    No diagnosis found. PLAN:    In order of problems listed above:           Total time spent with patient today *** minutes. This includes reviewing records, evaluating the patient and coordinating care.   Medication Adjustments/Labs and Tests Ordered: Current medicines are reviewed at length with the patient today.  Concerns regarding medicines are outlined above.  No orders of the defined types were placed in this encounter.  No orders of the defined types were placed in this encounter.    Signed, Lars Mage, MD, [facility], [facility] 08/01/2022 1:56 PM    Electrophysiology [facility]

## 2022-08-09 ENCOUNTER — Ambulatory Visit (INDEPENDENT_AMBULATORY_CARE_PROVIDER_SITE_OTHER): Payer: Medicare Other

## 2022-08-09 DIAGNOSIS — I639 Cerebral infarction, unspecified: Secondary | ICD-10-CM

## 2022-08-10 LAB — CUP PACEART REMOTE DEVICE CHECK
Date Time Interrogation Session: 20231102095222
Implantable Pulse Generator Implant Date: 20230208

## 2022-08-16 ENCOUNTER — Encounter: Payer: Self-pay | Admitting: Cardiology

## 2022-08-20 NOTE — Progress Notes (Signed)
[facility] Summary Report / Loop Recorder 

## 2022-09-11 ENCOUNTER — Ambulatory Visit (INDEPENDENT_AMBULATORY_CARE_PROVIDER_SITE_OTHER): Payer: Medicare Other

## 2022-09-11 DIAGNOSIS — I639 Cerebral infarction, unspecified: Secondary | ICD-10-CM | POA: Diagnosis not present

## 2022-09-11 LAB — CUP PACEART REMOTE DEVICE CHECK
Date Time Interrogation Session: 20231205095245
Implantable Pulse Generator Implant Date: 20230208

## 2022-10-09 NOTE — Progress Notes (Signed)
[facility] Summary Report / Loop Recorder 

## 2022-10-15 ENCOUNTER — Ambulatory Visit (INDEPENDENT_AMBULATORY_CARE_PROVIDER_SITE_OTHER): Payer: Medicare HMO

## 2022-10-15 DIAGNOSIS — I639 Cerebral infarction, unspecified: Secondary | ICD-10-CM | POA: Diagnosis not present

## 2022-10-16 LAB — CUP PACEART REMOTE DEVICE CHECK
Date Time Interrogation Session: 20240107230809
Implantable Pulse Generator Implant Date: 20230208

## 2022-10-16 NOTE — Progress Notes (Signed)
Cardiology Office Note:    Date:  10/22/2022   ID:  Connie Campbell, DOB [birth year], MRN [id number]  PCP:  Sue Lush, PA-C   [facility] Cardiologist:  Lenna Sciara, MD Referring MD: Sue Lush, PA-C   Chief Complaint/Reason for Referral:  Stroke  PATIENT DID NOT APPEAR FOR APPOINTMENT   ASSESSMENT:    Mitral valve insufficiency, unspecified etiology  Acute CVA (cerebrovascular accident) (Connie Campbell)  Type 2 diabetes mellitus with complication, with long-term current use of insulin ([place])  Hypertension associated with diabetes ([place])  Hyperlipidemia associated with type 2 diabetes mellitus ([place])  Aortic atherosclerosis ([place])     PLAN:    In order of problems listed above: 1.  Mitral regurgitation: This is only moderate on CMR imaging. 2.  Stroke: Continue Plavix monotherapy.  Bubble study echocardiogram demonstrated no atrial shunts.  LINQ monitor results from June 2023 demonstrate no atrial fibrillation  3.  Type 2 diabetes: Continue Plavix in lieu of aspirin, continue Jardiance, and patient is being managed by pharmacy in regards to Connie Campbell therapy. 4.  Hypertension:  5.  Hyperlipidemia: Goal LDL < 55.  On Repatha as managed by pharmacy.  Check Lp(a) today. 6.  Aortic atherosclerosis:  Continue Plavix and Repatha.  Dispo:  No follow-ups on file.     Medication Adjustments/Labs and Tests Ordered: Current medicines are reviewed at length with the patient today.  Concerns regarding medicines are outlined above.   Tests Ordered: No orders of the defined types were placed in this encounter.   Medication Changes: No orders of the defined types were placed in this encounter.   History of Present Illness:    FOCUSED PROBLEM LIST:   1.  Hypertension 2.  Hyperlipidemia with statin-induced myalgias; on Repatha managed by pharmacy; goal LDL < 55 3.  History of multiple prior strokes with acute stroke February 2023; no PFO/ASD on TTE March 2023: LINQ  monitor without atrial fibrillation 4.  T2DM on insulin 5.  Two remote DVTs but taken off Coumadin due to wildly fluctuating INRs (in 1991 and 2001, in [place]) around two separate hospitalizations 6.  Moderate to severe mitral regurgitation TTE March 2023; TEE aborted due to history of epiglottal injury (laryngoscopy July 2023 demonstrated no limitations to TEE; notes in Care Everywhere); CMR 2023 with moderate mitral regurgitation with late gadolinium enhancement of the mid anterolateral and inferolateral walls 7.  Frailty 8.  Chronic pain (possibly fibromyalgia) 9.  Aortic atherosclerosis on coronary CTA 2023 10.  Moderate LAD disease; FFR negative coronary CTA 2023  March 2023: Patient was seen for initial consultation due to hospital follow-up for stroke.  She is referred for bubble study echocardiogram which demonstrated no PFO or ASD.  It did show moderate to severe mitral regurgitation.  April 2023: The patient complained of increasing shortness of breath.  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  She was then referred for cardiac MRI which demonstrated only moderate mitral regurgitation however there was gadolinium enhancement seen in the inferolateral walls with associated hypokinesis with an ejection fraction of 56%.  She was also started on losartan due to high blood pressure.  July 2023: The patient is doing fairly well.  She still suffers from chronic pain that is diffuse in nature.  She denies any chest pain, shortness of breath, palpitations, or paroxysmal nocturnal dyspnea.  She is required  no emergency room visits or hospitalizations.  She has had no signs or symptoms of recurrent stroke.  She feels well from a cardiovascular perspective.   Plan:  Given CMR findings, refer to coronary CTA.  Today:  In the interim, the patient had a coronary CTA demonstrating  moderate, nonsignificant LAD disease.  Current Medications: No outpatient medications have been marked as taking for the 10/22/22 encounter (Office Visit) with Early Osmond, MD.     Allergies:    Coconut (cocos nucifera), Iodinated contrast media, Keflex [cephalexin], Penicillins, Statins, Aspirin, Bactrim [sulfamethoxazole-trimethoprim], Charentais melon (french melon), Erythromycin base, Ginkgo biloba, Gluten meal, Peanut butter flavor, Tramadol, Beta vulgaris, Ciprofloxacin, and Ibuprofen   Social History:   Social History   Tobacco Use   Smoking status: Never   Smokeless tobacco: Never   Tobacco comments:    3 month lifetime smoker  Vaping Use   Vaping Use: Never used  Substance Use Topics   Alcohol use: Yes    Comment: rare   Drug use: Never     Family Hx: Family History  Problem Relation Age of Onset   Heart failure Mother        Family history on mother's side has hereditary heart problems per patient   Cancer Mother    Atrial fibrillation Mother    Cancer Father      Review of Systems:   Please see the history of present illness.    All other systems reviewed and are negative.     EKGs/Labs/Other Test Reviewed:    EKG: EKG done April 2023 which I reviewed demonstrates sinus rhythm  Prior CV studies:  CMR 2023: Moderate mitral regurgitation ejection fraction around 55% with inferolateral late gadolinium enhancement was associated hypokinesis  Linq monitor interrogation June 2023 without atrial fibrillation  TTE 4/23 normal ejection fraction with moderate to severe mitral regurgitation the tip of the posterior leaflet looks relatively calcified.  She has mild MAC mitral annular calcification.  No other significant valvular abnormalities.  Carotid U/S 2/23: Mild disease bilaterally  Imaging studies that I have independently reviewed today:   MRI Brain 2/23 1. Small acute posterior left frontal lobe infarcts. 2. Chronic ischemia with multiple old  cerebral and cerebellar infarcts as above.  MRA Head 2/23 No proximal intracranial vessel occlusion. 2.  Asymmetric poor flow related enhancement of right posterior communicating artery and posterior cerebral artery. May be on a technical basis given lack of acute findings in this distribution on the MRI  Recent Labs: 11/16/2021: ALT 36 04/11/2022: Hemoglobin 13.5; Platelets 293 04/25/2022: BUN 20; Creatinine, Ser 0.92; Potassium 4.7; Sodium 140   Recent Lipid Panel Lab Results  Component Value Date/Time   CHOL 105 03/28/2022 07:53 AM   TRIG 166 (H) 03/28/2022 07:53 AM   HDL 49 03/28/2022 07:53 AM   LDLCALC 29 03/28/2022 07:53 AM    Risk Assessment/Calculations:          Physical Exam:      Signed, Early Osmond, MD  10/22/2022 9:22 AM    [facility] [address], [place], [place]  [address] Phone: [phone]; Fax: [phone]   Note:  This document was prepared using Dragon voice recognition software and may include unintentional dictation errors.

## 2022-10-19 ENCOUNTER — Other Ambulatory Visit: Payer: Self-pay | Admitting: Internal Medicine

## 2022-10-22 ENCOUNTER — Other Ambulatory Visit: Payer: Self-pay | Admitting: Internal Medicine

## 2022-10-22 ENCOUNTER — Ambulatory Visit: Payer: Medicare Other | Admitting: Internal Medicine

## 2022-10-22 DIAGNOSIS — E1159 Type 2 diabetes mellitus with other circulatory complications: Secondary | ICD-10-CM

## 2022-10-22 DIAGNOSIS — I340 Nonrheumatic mitral (valve) insufficiency: Secondary | ICD-10-CM

## 2022-10-22 DIAGNOSIS — Z794 Long term (current) use of insulin: Secondary | ICD-10-CM

## 2022-10-22 DIAGNOSIS — I700 Atherosclerosis of aorta: Secondary | ICD-10-CM

## 2022-10-22 DIAGNOSIS — I639 Cerebral infarction, unspecified: Secondary | ICD-10-CM

## 2022-10-22 DIAGNOSIS — E1169 Type 2 diabetes mellitus with other specified complication: Secondary | ICD-10-CM

## 2022-10-28 NOTE — Progress Notes (Signed)
New Patient Note  RE: Connie Campbell MRN: [id number] DOB: Mar 01, 1951 Date of Office Visit: 10/29/2022  Consult requested by: Filomena Jungling, NP Primary care provider: Nathaneil Canary, PA-C  Chief Complaint: Allergy Testing (Pt states she maybe allergic to Erythritol, coconut oil, gingko balboa.)  History of Present Illness: I had the pleasure of seeing Connie Campbell for initial evaluation at the [facility] on 10/29/2022. She is a 72 y.o. female, who is referred here by Nathaneil Canary, PA-C for the evaluation of food allergies.  12 years ago patient had allergy testing and was found to have multiple allergies - peanuts, gluten, melons, peas, beets. This was done as she was eating a lot of gluten products and had horrible gastritis - abdominal pains. Records not available for review.   She has been having a lot of pain in the arms and legs and concerned if she's eating anything that is making it worse.  Oatmeal and gluten causes gastritis, abdominal pains - symptoms can lasts for months afterwards? Melons, peas and beets causes arthritic pain/flare which lasts for about 3 days.  Peanuts causes chest tightness and throat tightness. Soy makes her stomach upset.   Dietary History: patient has been eating other foods including milk, eggs, treenuts, sesame, shellfish, fish,  meats, fruits and vegetables.  She reports reading labels and avoiding oatmeal, gluten, melons, peas, beets, peanuts, soy in diet completely.   Assessment and Plan: Connie Campbell is a 72 y.o. female with: Dietary counseling and surveillance Patient concerned if there any foods that she is allergic to that's making her pain worse. Apparently had skin testing 12 years ago which was positive to peanuts, gluten, melons, peas, beets. This was done due to her gastritis issues. Oatmeal and gluten cause GI symptoms; melons, peas and beets flare her arthritis pain; peanuts cause chest and throat tightness. Soy causes  upset stomach. Medical history significant for diabetes, CVA, hypothyroid, seizures, chronic pain. Discussed with patient that skin prick testing and bloodwork (food IgE levels) check for IgE mediated reactions which her clinical presentation does not support.  Keep a food journal with symptoms and foods eaten. There is no testing for erythritol or gingko balboa.  Avoid foods that are bothersome to you. Joint and muscle pain is NOT an IgE mediated allergic symptom.  Oatmeal, gluten, melons, peas, beets, peanuts, soy. If you are still having abdominal issues then recommend GI evaluation next.   Multiple drug allergies Multiple drugs listed on allergy list - some seem to be more of a side effect.  If interested we can schedule drug challenges to the individual antibiotics on allergy list.  There is no bloodwork for this.   Chronic rhinitis Denies any current symptoms.  Monitor symptoms. Recommend environmental panel testing if worsening symptoms.   History of asthma As a child but denies asthma like symptoms now.  Return if symptoms worsen or fail to improve.  No orders of the defined types were placed in this encounter.  Lab Orders  No laboratory test(s) ordered today    Other allergy screening: Asthma: yes As a child but no issues now.  Rhino conjunctivitis: yes In the past but asymptomatic currently. Medication allergy: yes Bactrim - rash Keflex - arm swelling Erythromycin - leg swelling Penicillin - arm swelling Antibiotics cause GI issues. See list  Hymenoptera allergy: no Urticaria: no Eczema:no History of recurrent infections suggestive of immunodeficency: no  Diagnostics: None.   Past Medical History: Patient Active Problem List  Diagnosis Date Noted   Multiple drug allergies 10/29/2022   Other adverse food reactions, not elsewhere classified, subsequent encounter 10/29/2022   Dietary counseling and surveillance 10/29/2022   Chronic rhinitis 10/29/2022    History of asthma 10/29/2022   Type 2 diabetes mellitus ([place]) 11/20/2021   Cervical dystonia 11/20/2021   Ischemic stroke of frontal lobe ([place]) 11/15/2021   Acute CVA (cerebrovascular accident) ([place]) 11/09/2021   Hypertension 11/09/2021   Dyslipidemia 11/09/2021   Hypothyroidism (acquired) 11/09/2021   Class 1 obesity due to excess calories with body mass index (BMI) of 31.0 to 31.9 in adult 11/09/2021   Chronic pain disorder 11/09/2021   Seizure disorder as sequela of cerebrovascular accident [facility]) 11/09/2021   Past Medical History:  Diagnosis Date   Bilateral neck pain    with tension headaches at times.   Celiac disease    Chronic pain    BUE/BLE and constant.   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 30 tablet 0   clopidogrel (PLAVIX) 75 MG tablet Take 1 tablet (75 mg total) by mouth daily. 30 tablet 0   diphenhydrAMINE (BENADRYL) 50 MG tablet Take 1 tablet (50mg ) 1 hr prior to CT appt 1 tablet 0   empagliflozin (JARDIANCE) 10 MG TABS tablet Take 1 tablet (10 mg total) by mouth daily before breakfast. 90 tablet 3   Evolocumab (REPATHA SURECLICK) 413 MG/ML SOAJ  INJECT 1 PEN. INTO THE SKIN EVERY 14 (FOURTEEN) DAYS. 6 mL 3   levETIRAcetam (KEPPRA) 750 MG tablet Take 750 mg by mouth 2 (two) times daily.     losartan (COZAAR) 25 MG tablet Take 1 tablet (25 mg total) by mouth daily. 90 tablet 3   magnesium gluconate (MAGONATE) 500 MG tablet TAKE 1 TABLET BY MOUTH AT BEDTIME. 90 tablet 3   metFORMIN (GLUCOPHAGE) 1000 MG tablet Take 1 tablet (1,000 mg total) by mouth 2 (two) times daily with a meal. 60 tablet 0   Multiple Vitamin (MULTIVITAMIN) capsule Take 1 capsule by mouth daily.     oxyCODONE-acetaminophen (PERCOCET) 10-325 MG tablet Take 1 tablet by mouth 4 (four) times daily as needed for pain.     OZEMPIC, 0.25 OR 0.5 MG/DOSE, 2 MG/1.5ML SOPN Inject 0.5 mg into the skin once a week. On thursdays 1.5 mL 0   polyvinyl alcohol (LIQUIFILM TEARS) 1.4 % ophthalmic solution Place 1 drop into both eyes as needed for dry eyes. 15 mL 0   simethicone (MYLICON) 244 MG chewable tablet Chew 125 mg by mouth every 6 (six) hours as needed for flatulence.  SYNTHROID 100 MCG tablet Take 100 mcg by mouth daily.     timolol (TIMOPTIC) 0.5 % ophthalmic solution Place 1 drop into the right eye 2 (two) times daily.     TOUJEO MAX SOLOSTAR 300 UNIT/ML Solostar Pen Inject into the skin.     metoprolol tartrate (LOPRESSOR) 100 MG tablet Take 1 tablet (100 mg total) by mouth once for 1 dose. Take 90-120 minutes prior to scan. 1 tablet 0   No current facility-administered medications for this visit.   Allergies: Allergies  Allergen Reactions   Coconut (Cocos Nucifera) Other (See Comments)    seizure   Iodinated Contrast Media Shortness Of Breath    Pt states 20 years ago she had a contrast during a scan and had shortness of breath.   Keflex [Cephalexin] Swelling   Penicillins Swelling   Statins Other (See Comments)    Causes spasms and increased dystonia   Aspirin Other (See Comments)    GI upset      Bactrim [Sulfamethoxazole-Trimethoprim] Hives   Charentais Melon  (French Melon) Other (See Comments)   Erythromycin Base Other (See Comments) and Rash   Ginkgo Biloba Other (See Comments) and Rash   Gluten Meal Diarrhea   Peanut Butter Flavor Hives   Tramadol Other (See Comments)    Depression, GI discomfort   Beta Vulgaris    Ciprofloxacin    Lisinopril Other (See Comments)   Soy Allergy Other (See Comments)   Trimethoprim Other (See Comments)   Ibuprofen Diarrhea    Other reaction(s): GI intolerance STOMACH PAINS bleeding Other reaction(s): GI intoleran   Social History: Social History   Socioeconomic History   Marital status: Married    Spouse name: Not on file   Number of children: Not on file   Years of education: Not on file   Highest education level: Not on file  Occupational History   Occupation: retired  Tobacco Use   Smoking status: Never    Passive exposure: Never   Smokeless tobacco: Never   Tobacco comments:    3 month lifetime smoker  Vaping Use   Vaping Use: Never used  Substance and Sexual Activity   Alcohol use: Yes    Comment: rare   Drug use: Never   Sexual activity: Not on file  Other Topics Concern   Not on file  Social History Narrative   Not on file   Social Determinants of Health   Financial Resource Strain: Not on file  Food Insecurity: Not on file  Transportation Needs: Not on file  Physical Activity: Not on file  Stress: Not on file  Social Connections: Not on file   Lives in a house which was built in 1953. Smoking: denies Occupation: disabled  Environmental HistoryFreight forwarder in the house: no Charity fundraiser in the family room: yes Carpet in the bedroom: no Heating: electric Cooling: central Pet: yes 3 cats  x many years  Family History: Family History  Problem Relation Age of Onset   Eczema Mother    Heart failure Mother        Family history on mother's side has hereditary heart problems per patient   Cancer Mother    Atrial fibrillation Mother    Cancer Father    Review  of Systems  Constitutional:  Negative for appetite change, chills, fever and unexpected weight change.  HENT:  Negative for congestion and rhinorrhea.   Eyes:  Negative for itching.  Respiratory:  Negative for cough, chest tightness, shortness of breath  and wheezing.   Cardiovascular:  Negative for chest pain.  Gastrointestinal:  Positive for abdominal pain.  Genitourinary:  Negative for difficulty urinating.  Musculoskeletal:  Positive for arthralgias.  Skin:  Negative for rash.  Neurological:  Negative for headaches.    Objective: BP (!) 140/78 (BP Location: Right Arm, Patient Position: Sitting, Cuff Size: Normal)   Pulse 78   Temp 97.8 F (36.6 C) (Temporal)   Resp 12   SpO2 97%  There is no height or weight on file to calculate BMI. Physical Exam Vitals and nursing note reviewed.  Constitutional:      Appearance: Normal appearance. She is well-developed.  HENT:     Head: Normocephalic and atraumatic.     Right Ear: Tympanic membrane and external ear normal.     Left Ear: Tympanic membrane and external ear normal.     Nose: Nose normal.     Mouth/Throat:     Mouth: Mucous membranes are moist.     Pharynx: Oropharynx is clear.  Eyes:     Conjunctiva/sclera: Conjunctivae normal.  Cardiovascular:     Rate and Rhythm: Normal rate and regular rhythm.     Heart sounds: Normal heart sounds. No murmur heard.    No friction rub. No gallop.  Pulmonary:     Effort: Pulmonary effort is normal.     Breath sounds: Normal breath sounds. No wheezing, rhonchi or rales.  Musculoskeletal:     Cervical back: Neck supple.  Skin:    General: Skin is warm.     Findings: No rash.  Neurological:     Mental Status: She is alert and oriented to person, place, and time.  Psychiatric:        Behavior: Behavior normal.   The plan was reviewed with the patient/family, and all questions/concerned were addressed.  It was my pleasure to see Connie Campbell today and participate in her care. Please feel  free to contact me with any questions or concerns.  Sincerely,  Rexene Alberts, DO Allergy & Immunology  [facility] office: [place] office: [phone]

## 2022-10-29 ENCOUNTER — Other Ambulatory Visit: Payer: Self-pay

## 2022-10-29 ENCOUNTER — Ambulatory Visit (INDEPENDENT_AMBULATORY_CARE_PROVIDER_SITE_OTHER): Payer: Medicare HMO | Admitting: Allergy

## 2022-10-29 ENCOUNTER — Encounter: Payer: Self-pay | Admitting: Allergy

## 2022-10-29 VITALS — BP 140/78 | HR 78 | Temp 97.8°F | Resp 12

## 2022-10-29 DIAGNOSIS — T781XXD Other adverse food reactions, not elsewhere classified, subsequent encounter: Secondary | ICD-10-CM | POA: Diagnosis not present

## 2022-10-29 DIAGNOSIS — Z8709 Personal history of other diseases of the respiratory system: Secondary | ICD-10-CM | POA: Insufficient documentation

## 2022-10-29 DIAGNOSIS — Z889 Allergy status to unspecified drugs, medicaments and biological substances status: Secondary | ICD-10-CM

## 2022-10-29 DIAGNOSIS — Z713 Dietary counseling and surveillance: Secondary | ICD-10-CM | POA: Insufficient documentation

## 2022-10-29 DIAGNOSIS — J310 Chronic rhinitis: Secondary | ICD-10-CM | POA: Diagnosis not present

## 2022-10-29 NOTE — Assessment & Plan Note (Addendum)
Patient concerned if there any foods that she is allergic to that's making her pain worse. Apparently had skin testing 12 years ago which was positive to peanuts, gluten, melons, peas, beets. This was done due to her gastritis issues. Oatmeal and gluten cause GI symptoms; melons, peas and beets flare her arthritis pain; peanuts cause chest and throat tightness. Soy causes upset stomach. Medical history significant for diabetes, CVA, hypothyroid, seizures, chronic pain. Discussed with patient that skin prick testing and bloodwork (food IgE levels) check for IgE mediated reactions which her clinical presentation does not support.  Keep a food journal with symptoms and foods eaten. There is no testing for erythritol or gingko balboa.  Avoid foods that are bothersome to you. Joint and muscle pain is NOT an IgE mediated allergic symptom.  Oatmeal, gluten, melons, peas, beets, peanuts, soy. If you are still having abdominal issues then recommend GI evaluation next.

## 2022-10-29 NOTE — Assessment & Plan Note (Signed)
Multiple drugs listed on allergy list - some seem to be more of a side effect.  If interested we can schedule drug challenges to the individual antibiotics on allergy list.  There is no bloodwork for this.

## 2022-10-29 NOTE — Patient Instructions (Addendum)
Discussed with patient that skin prick testing and bloodwork (food IgE levels) check for IgE mediated reactions which her clinical presentation does not support.  Keep a food journal with symptoms and foods eaten. There is no testing for erythritol, gingko balboa.  Avoid foods that are bothersome to you. Oatmeal, gluten, melons, peas, beets, peanuts, soy.  If you are still having abdominal issues then recommend to see GI next.  Multiple drug allergies If interested we can schedule drug challenges to the individual antibiotics on your allergy list.  There is no bloodwork for this.  Drug challenge instructions:  You must be off antihistamines for 3-5 days before. Must be in good health and not ill. No vaccines/injections/antibiotics within the past 7 days. Plan on being in the office for 2-3 hours and must bring in the drug you want to do the oral challenge for - will send in prescription to pick up a few days before. You must call to schedule an appointment and specify it's for a drug challenge.   Follow up as needed.

## 2022-10-29 NOTE — Assessment & Plan Note (Signed)
Denies any current symptoms.  Monitor symptoms. Recommend environmental panel testing if worsening symptoms.

## 2022-10-29 NOTE — Assessment & Plan Note (Signed)
As a child but denies asthma like symptoms now.

## 2022-10-30 ENCOUNTER — Telehealth: Payer: Self-pay | Admitting: Pharmacist

## 2022-10-30 MED ORDER — REPATHA SURECLICK 140 MG/ML ~~LOC~~ SOAJ: 1.0000 | SUBCUTANEOUS | 3 refills | Status: DC

## 2022-10-30 NOTE — Telephone Encounter (Addendum)
Received message from [facility] that Repatha PA is needed. This has been submitted, key [id number].

## 2022-10-30 NOTE — Telephone Encounter (Signed)
PA approved through 10/08/23, refill sent in.

## 2022-10-31 NOTE — Progress Notes (Unsigned)
Cardiology Office Note:    Date:  11/01/2022   ID:  Connie Campbell, DOB 15-Sep-1951, MRN [id number]  PCP:  Elinor Parkinson   [place] [facility] Cardiologist:  Lenna Sciara, MD Referring MD: Sue Lush, PA-C   Chief Complaint/Reason for Referral:  Stroke  ASSESSMENT:    Mitral valve insufficiency, unspecified etiology  Acute CVA (cerebrovascular accident) [facility]  Type 2 diabetes mellitus with complication, with long-term current use of insulin ([place])  Hypertension associated with diabetes ([place])  Hyperlipidemia associated with type 2 diabetes mellitus ([place])  Aortic atherosclerosis ([place])  Bilateral carotid artery stenosis     PLAN:    In order of problems listed above: 1.  Mitral regurgitation: This is only moderate on CMR imaging.  The patient is without significant dyspnea.  Monitor for now.  Given her issues with her cervical spine and problems with a TEE in the past even if this were to require mitral transcatheter edge-to-edge repair we may need to consider ice imaging 2.  Stroke: Continue Plavix monotherapy.  Bubble study echocardiogram demonstrated no atrial shunts.  LINQ monitor results from June 2023 demonstrated no atrial fibrillation; seeing Dr. Quentin Ore regarding [facility] monitor extraction. 3.  Type 2 diabetes: Continue Plavix in lieu of aspirin, continue Jardiance, and patient is being managed by pharmacy in regards to [place] therapy. 4.  Hypertension:  5.  Hyperlipidemia: Goal LDL < 55.  LDL in June was 29.   On Repatha as managed by pharmacy.   6.  Aortic atherosclerosis:  Continue Plavix and Repatha. 7.  Carotid stenosis:  Mild bilaterally, continue plavix and Repatha.  Dispo:  Return in about 9 months (around 08/02/2023).     Medication Adjustments/Labs and Tests Ordered: Current medicines are reviewed at length with the patient today.  Concerns regarding medicines are outlined above.   Tests Ordered: No orders of the defined types were  placed in this encounter.   Medication Changes: No orders of the defined types were placed in this encounter.   History of Present Illness:    FOCUSED PROBLEM LIST:   1.  Hypertension 2.  Hyperlipidemia with statin-induced myalgias; on Repatha managed by pharmacy 3.  History of multiple prior strokes with acute stroke February 2023; no PFO/ASD on TTE March 2023: LINQ monitor without atrial fibrillation 4.  T2DM on insulin 5.  Two remote DVTs but taken off Coumadin due to wildly fluctuating INRs (in 1991 and 2001, in [place]) around two separate hospitalizations 6.  Moderate to severe mitral regurgitation TTE March 2023; TEE aborted due to history of epiglottal injury (laryngoscopy July 2023 demonstrated no limitations to TEE; notes in Care Everywhere); CMR 2023 with moderate mitral regurgitation with late gadolinium enhancement of the mid anterolateral and inferolateral walls 7.  Frailty 8.  Chronic pain (possibly fibromyalgia) 9.  Coronary CTA 2023 with moderate LAD disease with negative FFR  March 2023: Patient was seen for initial consultation due to hospital follow-up for stroke.  She is referred for bubble study echocardiogram which demonstrated no PFO or ASD.  It did show moderate to severe mitral regurgitation.  April 2023: The patient complained of increasing shortness of breath.  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  She was then referred for cardiac MRI which demonstrated only moderate mitral regurgitation however there was gadolinium enhancement seen in the inferolateral walls with associated hypokinesis with  an ejection fraction of 56%.  She was also started on losartan due to high blood pressure.  July 2023: The patient is doing fairly well.  She still suffers from chronic pain that is diffuse in nature.  She denies any chest pain, shortness of breath,  palpitations, or paroxysmal nocturnal dyspnea.  She is required no emergency room visits or hospitalizations.  She has had no signs or symptoms of recurrent stroke.  She feels well from a cardiovascular perspective.  Plan: Given myocardial scar on CMR and dyspnea, refer for coronary CTA.  Today:  In the interim, the patient had a coronary CTA demonstrating moderate, nonsignificant LAD disease.   Current Medications: Current Meds  Medication Sig   carisoprodol (SOMA) 350 MG tablet Take 1 tablet (350 mg total) by mouth daily as needed (severe muscle spasms).   clopidogrel (PLAVIX) 75 MG tablet Take 1 tablet (75 mg total) by mouth daily.   diphenhydrAMINE (BENADRYL) 50 MG tablet Take 1 tablet (8m) 1 hr prior to CT appt   empagliflozin (JARDIANCE) 10 MG TABS tablet Take 1 tablet (10 mg total) by mouth daily before breakfast.   Evolocumab (REPATHA SURECLICK) 1096MG/ML SOAJ Inject 140 mg into the skin every 14 (fourteen) days.   levETIRAcetam (KEPPRA) 750 MG tablet Take 750 mg by mouth 2 (two) times daily.   losartan (COZAAR) 25 MG tablet Take 1 tablet (25 mg total) by mouth daily.   magnesium gluconate (MAGONATE) 500 MG tablet TAKE 1 TABLET BY MOUTH AT BEDTIME.   metFORMIN (GLUCOPHAGE) 1000 MG tablet Take 1 tablet (1,000 mg total) by mouth 2 (two) times daily with a meal.   Multiple Vitamin (MULTIVITAMIN) capsule Take 1 capsule by mouth daily.   oxyCODONE-acetaminophen (PERCOCET) 10-325 MG tablet Take 1 tablet by mouth 4 (four) times daily as needed for pain.   OZEMPIC, 0.25 OR 0.5 MG/DOSE, 2 MG/1.5ML SOPN Inject 0.5 mg into the skin once a week. On thursdays   polyvinyl alcohol (LIQUIFILM TEARS) 1.4 % ophthalmic solution Place 1 drop into both eyes as needed for dry eyes.   simethicone (MYLICON) 1283MG chewable tablet Chew 125 mg by mouth every 6 (six) hours as needed for flatulence.   SYNTHROID 100 MCG tablet Take 100 mcg by mouth daily.   timolol (TIMOPTIC) 0.5 % ophthalmic solution Place 1  drop into the right eye 2 (two) times daily.   TOUJEO MAX SOLOSTAR 300 UNIT/ML Solostar Pen Inject into the skin.     Allergies:    Coconut (cocos nucifera), Iodinated contrast media, Keflex [cephalexin], Penicillins, Statins, Aspirin, Bactrim [sulfamethoxazole-trimethoprim], Charentais melon (french melon), Erythromycin base, Ginkgo biloba, Gluten meal, Peanut butter flavor, Tramadol, Beta vulgaris, Ciprofloxacin, Lisinopril, Soy allergy, Trimethoprim, and Ibuprofen   Social History:   Social History   Tobacco Use   Smoking status: Never    Passive exposure: Never   Smokeless tobacco: Never   Tobacco comments:    3 month lifetime smoker  Vaping Use   Vaping Use: Never used  Substance Use Topics   Alcohol use: Yes    Comment: rare   Drug use: Never     Family Hx: Family History  Problem Relation Age of Onset   Eczema Mother    Heart failure Mother        Family history on mother's side has hereditary heart problems per patient   Cancer Mother    Atrial fibrillation Mother    Cancer Father      Review of Systems:   Please  see the history of present illness.    All other systems reviewed and are negative.     EKGs/Labs/Other Test Reviewed:    EKG: EKG done April 2023 which I reviewed demonstrates sinus rhythm  Prior CV studies:  Coronary CTA 2023: 1. Coronary calcium score of 5. This was 48 percentile for age and sex matched control. 2. Normal coronary origin with right dominance. 3. No flow limiting coronary artery disease. Moderate LAD stenosis with normal FFR. Small caliber AV groove circumflex.  CMR 2023: Moderate mitral regurgitation ejection fraction around 55% with inferolateral late gadolinium enhancement was associated hypokinesis  Linq monitor interrogation June 2023 without atrial fibrillation  TTE 4/23 normal ejection fraction with moderate to severe mitral regurgitation the tip of the posterior leaflet looks relatively calcified.  She has mild MAC  mitral annular calcification.  No other significant valvular abnormalities.  Carotid U/S 2/23: Mild disease bilaterally  Imaging studies that I have independently reviewed today:   MRI Brain 2/23 1. Small acute posterior left frontal lobe infarcts. 2. Chronic ischemia with multiple old cerebral and cerebellar infarcts as above.  MRA Head 2/23 No proximal intracranial vessel occlusion. 2.  Asymmetric poor flow related enhancement of right posterior communicating artery and posterior cerebral artery. May be on a technical basis given lack of acute findings in this distribution on the MRI  Recent Labs: 11/16/2021: ALT 36 04/11/2022: Hemoglobin 13.5; Platelets 293 04/25/2022: BUN 20; Creatinine, Ser 0.92; Potassium 4.7; Sodium 140   Recent Lipid Panel Lab Results  Component Value Date/Time   CHOL 105 03/28/2022 07:53 AM   TRIG 166 (H) 03/28/2022 07:53 AM   HDL 49 03/28/2022 07:53 AM   LDLCALC 29 03/28/2022 07:53 AM    Risk Assessment/Calculations:          Physical Exam:    VS:  BP (!) 99/58   Pulse 80   Ht 5' 3.5" (1.613 m)   Wt 191 lb 3.2 oz (86.7 kg)   SpO2 96%   BMI 33.34 kg/m    Wt Readings from Last 3 Encounters:  11/01/22 191 lb 3.2 oz (86.7 kg)  04/25/22 180 lb (81.6 kg)  02/20/22 178 lb 6.4 oz (80.9 kg)    GENERAL:  No apparent distress, Aox3, frail, looks older than her stated age 72:  No carotid bruits, +2 carotid impulses, no scleral icterus CAR: RRR 3 out of 6 short systolic murmur RES:  Clear to auscultation bilaterally ABD:  Soft, nontender, nondistended, positive bowel sounds x 4 VASC:  +2 radial pulses, +2 carotid pulses, palpable pedal pulses NEURO:  CN 2-12 grossly intact; motor and sensory grossly intact PSYCH:  No active depression or anxiety EXT:  No edema, ecchymosis, or cyanosis  Signed, Early Osmond, MD  11/01/2022 3:22 PM    [address], [place], [place]  [address] Phone: [phone]; Fax:  [phone]   Note:  This document was prepared using Dragon voice recognition software and may include unintentional dictation errors.

## 2022-11-01 ENCOUNTER — Ambulatory Visit: Payer: Medicare HMO | Attending: Internal Medicine | Admitting: Internal Medicine

## 2022-11-01 ENCOUNTER — Encounter: Payer: Self-pay | Admitting: Internal Medicine

## 2022-11-01 VITALS — BP 99/58 | HR 80 | Ht 63.5 in | Wt 191.2 lb

## 2022-11-01 DIAGNOSIS — E118 Type 2 diabetes mellitus with unspecified complications: Secondary | ICD-10-CM

## 2022-11-01 DIAGNOSIS — I639 Cerebral infarction, unspecified: Secondary | ICD-10-CM

## 2022-11-01 DIAGNOSIS — I700 Atherosclerosis of aorta: Secondary | ICD-10-CM

## 2022-11-01 DIAGNOSIS — E785 Hyperlipidemia, unspecified: Secondary | ICD-10-CM

## 2022-11-01 DIAGNOSIS — E1159 Type 2 diabetes mellitus with other circulatory complications: Secondary | ICD-10-CM | POA: Diagnosis not present

## 2022-11-01 DIAGNOSIS — E1169 Type 2 diabetes mellitus with other specified complication: Secondary | ICD-10-CM

## 2022-11-01 DIAGNOSIS — I6523 Occlusion and stenosis of bilateral carotid arteries: Secondary | ICD-10-CM

## 2022-11-01 DIAGNOSIS — I340 Nonrheumatic mitral (valve) insufficiency: Secondary | ICD-10-CM | POA: Diagnosis not present

## 2022-11-01 DIAGNOSIS — Z794 Long term (current) use of insulin: Secondary | ICD-10-CM

## 2022-11-01 DIAGNOSIS — I152 Hypertension secondary to endocrine disorders: Secondary | ICD-10-CM

## 2022-11-01 NOTE — Patient Instructions (Signed)
Medication Instructions:  No changes *If you need a refill on your cardiac medications before your next appointment, please call your pharmacy*   Lab Work: none If you have labs (blood work) drawn today and your tests are completely normal, you will receive your results only by: [place] (if you have MyChart) OR A paper copy in the mail If you have any lab test that is abnormal or we need to change your treatment, we will call you to review the results.   Testing/Procedures: none   Follow-Up: At [facility], you and your health needs are our priority.  As part of our continuing mission to provide you with exceptional heart care, we have created designated Provider Care Teams.  These Care Teams include your primary Cardiologist (physician) and Advanced Practice Providers (APPs -  Physician Assistants and Nurse Practitioners) who all work together to provide you with the care you need, when you need it.  We recommend signing up for the patient portal called "MyChart".  Sign up information is provided on this After Visit Summary.  MyChart is used to connect with patients for Virtual Visits (Telemedicine).  Patients are able to view lab/test results, encounter notes, upcoming appointments, etc.  Non-urgent messages can be sent to your provider as well.   To learn more about what you can do with MyChart, go to [email or web address].    Your next appointment:   9 month(s)  Provider:   Early Osmond, MD     Other Instructions

## 2022-11-02 ENCOUNTER — Ambulatory Visit (INDEPENDENT_AMBULATORY_CARE_PROVIDER_SITE_OTHER): Payer: Medicare HMO | Admitting: Cardiology

## 2022-11-02 ENCOUNTER — Encounter: Payer: Self-pay | Admitting: Cardiology

## 2022-11-02 VITALS — BP 122/80 | HR 82 | Ht 63.5 in | Wt 191.3 lb

## 2022-11-02 DIAGNOSIS — I639 Cerebral infarction, unspecified: Secondary | ICD-10-CM | POA: Diagnosis not present

## 2022-11-02 DIAGNOSIS — I340 Nonrheumatic mitral (valve) insufficiency: Secondary | ICD-10-CM

## 2022-11-02 NOTE — Progress Notes (Deleted)
Electrophysiology Office Follow up Visit Note:    Date:  11/02/2022   ID:  Connie Campbell, DOB 09/11/1951, MRN [id number]  PCP:  Sue Lush, [facility] Cardiologist:  Early Osmond, MD  [facility] Electrophysiologist:  Vickie Epley, MD    Interval History:    Connie Campbell is a 72 y.o. female who presents for a follow up visit.  I met the patient when she was hospitalized for cryptogenic stroke in February 2023.  A loop recorder was implanted at that time.  She follows with Dr. Ali Lowe for mitral valve disease.  She presents today to have her loop recorder removed.***       Past Medical History:  Diagnosis Date   Bilateral neck pain    with tension headaches at times.   Celiac disease    Chronic pain    BUE/BLE and constant.   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     Allergies:   Coconut (cocos nucifera), Iodinated contrast media, Keflex [cephalexin], Penicillins, Statins, Aspirin, Bactrim [sulfamethoxazole-trimethoprim], Charentais melon (french melon), Erythromycin base, Ginkgo biloba, Gluten meal, Peanut butter flavor, Tramadol, Beta vulgaris, Ciprofloxacin, Lisinopril, Soy allergy, Trimethoprim, and  Ibuprofen   Social History   Socioeconomic History   Marital status: Married    Spouse name: Not on file   Number of children: Not on file   Years of education: Not on file   Highest education level: Not on file  Occupational History   Occupation: retired  Tobacco Use   Smoking status: Never    Passive exposure: Never   Smokeless tobacco: Never   Tobacco comments:    3 month lifetime smoker  Vaping Use   Vaping Use: Never used  Substance and Sexual Activity   Alcohol use: Yes    Comment: rare   Drug use: Never   Sexual activity: Not on file  Other Topics Concern   Not on file  Social History Narrative   Not on file   Social Determinants of Health   Financial Resource Strain: Not on file  Food Insecurity: Not on file  Transportation Needs: Not on file  Physical Activity: Not on file  Stress: Not on file  Social Connections: Not on file     Family History: The patient's family history includes Atrial fibrillation in her mother; Cancer in her father and mother; Eczema in her mother; Heart failure in her mother.  ROS:   Please see the history of present illness.    All other systems reviewed and are negative.  EKGs/Labs/Other Studies Reviewed:    The following studies were reviewed today:  Recent  Labs: 11/16/2021: ALT 36 04/11/2022: Hemoglobin 13.5; Platelets 293 04/25/2022: BUN 20; Creatinine, Ser 0.92; Potassium 4.7; Sodium 140  Recent Lipid Panel    Component Value Date/Time   CHOL 105 03/28/2022 0753   TRIG 166 (H) 03/28/2022 0753   HDL 49 03/28/2022 0753   CHOLHDL 2.1 03/28/2022 0753   CHOLHDL 4.2 11/10/2021 0429   VLDL 33 11/10/2021 0429   LDLCALC 29 03/28/2022 0753    Physical Exam:    VS:  There were no vitals taken for this visit.    Wt Readings from Last 3 Encounters:  11/01/22 191 lb 3.2 oz (86.7 kg)  04/25/22 180 lb (81.6 kg)  02/20/22 178 lb 6.4 oz (80.9 kg)     GEN: *** Well nourished, well developed in no acute distress CARDIAC:  ***RRR, no murmurs, rubs, gallops RESPIRATORY:  Clear to auscultation without rales, wheezing or rhonchi        ASSESSMENT:    No diagnosis found. PLAN:    In order of problems listed above:  #Cryptogenic stroke Loop recorder in situ.  Recorder monitoring has demonstrated no evidence of atrial fibrillation since implant.  There have been episodes falsely labeled atrial fibrillation that are secondary to T wave oversensing.  #Mitral valve disease Follows with Dr. Ali Lowe.  No signs of significant heart failure today on exam.    Medication Adjustments/Labs and Tests Ordered: Current medicines are reviewed at length with the patient today.  Concerns regarding medicines are outlined above.  No orders of the defined types were placed in this encounter.  No orders of the defined types were placed in this encounter.    Signed, Lars Mage, MD, [facility], [facility] 11/02/2022 6:04 AM    Electrophysiology [facility]   -----------------------------  SURGEON:  Lars Mage, MD    PREPROCEDURE DIAGNOSIS: Cryptogenic stroke    POSTPROCEDURE DIAGNOSIS: Cryptogenic stroke     PROCEDURES:   1. Implantable loop recorder explantation    INTRODUCTION:  Connie Campbell is a 72 y.o. female with a history of cryptogenic stroke who presents today for implantable loop explantation.  The patient previously had a loop recorder implanted.  The patient wishes to have the loop recorder removed.    DESCRIPTION OF PROCEDURE:  Informed written consent was obtained.  The patient required no sedation for the procedure today.   The patients left chest was therefore prepped and draped in the usual sterile fashion.  The skin overlying the ILR monitor was infiltrated with lidocaine for local analgesia.  A 0.5-cm incision was made over the site.  The previously implanted ILR was exposed and removed using a combination of sharp and blunt dissection.  Steri- Strips and a sterile dressing  were then applied. EBL<53ml.  There were no early apparent complications.     CONCLUSIONS:   1. Successful explantation of an implantable loop recorder   2. No early apparent complications.        Lars Mage, MD 11/02/2022 6:08 AM

## 2022-11-02 NOTE — Progress Notes (Signed)
Electrophysiology Office Follow up Visit Note:    Date:  11/02/2022   ID:  Connie Campbell, DOB 06/14/1951, MRN [id number]  PCP:  Connie Campbell, [facility] Cardiologist:  Connie Osmond, MD  [facility] Electrophysiologist:  Connie Epley, MD    Interval History:    Connie Campbell is a 72 y.o. female who presents for a follow up visit.  I met the patient when she was hospitalized for cryptogenic stroke in February 2023.  A loop recorder was implanted at that time.  She follows with Dr. Ali Lowe for mitral valve disease.  She presents today to have her loop recorder removed.  Today, she is accompanied by a family member. At this time she denies any significant issues related to her loop recorder.   She has struggled with chronic pain for years. She endorses arthritis, but is concerned that she has not been tested for rheumatoid arthritis.  She denies any palpitations, chest pain, shortness of breath, or peripheral edema. No lightheadedness, headaches, syncope, orthopnea, or PND.      Past Medical History:  Diagnosis Date   Bilateral neck pain    with tension headaches at times.   Celiac disease    Chronic pain    BUE/BLE and constant.   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   carisoprodol (SOMA) 350 MG tablet Take 1 tablet (350 mg total) by mouth daily as needed (severe muscle spasms).   cetirizine (ZYRTEC) 10 MG tablet Take 10 mg by mouth daily.   clopidogrel (PLAVIX) 75 MG tablet Take 1 tablet (75 mg total) by mouth daily.   diphenhydrAMINE (BENADRYL) 50 MG tablet Take 1 tablet (50mg ) 1 hr prior to CT appt   empagliflozin (JARDIANCE) 10 MG TABS tablet Take 1 tablet (10 mg total) by mouth daily before breakfast.   Evolocumab (REPATHA SURECLICK) 916 MG/ML SOAJ Inject 140 mg into the skin every 14 (fourteen) days.   levETIRAcetam (KEPPRA) 750 MG tablet Take 750 mg by mouth 2 (two) times daily.   losartan (COZAAR) 25 MG tablet Take 1 tablet (25 mg total) by mouth daily.   magnesium gluconate (MAGONATE) 500 MG tablet TAKE 1 TABLET BY MOUTH AT BEDTIME.   metFORMIN (GLUCOPHAGE) 1000 MG tablet Take 1 tablet (1,000 mg total) by mouth 2 (two) times daily with a meal.   Multiple Vitamin (MULTIVITAMIN) capsule Take 1 capsule by mouth daily.   omeprazole (PRILOSEC) 40 MG capsule Take 40 mg by mouth daily.   oxyCODONE-acetaminophen (PERCOCET) 10-325 MG tablet Take 1 tablet by mouth 4 (four) times daily as needed for pain.  OZEMPIC, 0.25 OR 0.5 MG/DOSE, 2 MG/1.5ML SOPN Inject 0.5 mg into the skin once a week. On thursdays   polyvinyl alcohol (LIQUIFILM TEARS) 1.4 % ophthalmic solution Place 1 drop into both eyes as needed for dry eyes.   simethicone (MYLICON) 125 MG chewable tablet Chew 125 mg by mouth every 6 (six) hours as needed for flatulence.   sitaGLIPtin (JANUVIA) 100 MG tablet Take 100 mg by mouth daily.   SYNTHROID 100 MCG tablet Take 100 mcg by mouth daily.   timolol (TIMOPTIC) 0.5 % ophthalmic solution Place 1 drop into the right eye 2 (two) times daily.   TOUJEO MAX SOLOSTAR 300 UNIT/ML Solostar Pen Inject into the skin.     Allergies:   Coconut (cocos nucifera), Iodinated contrast media, Keflex [cephalexin],  Penicillins, Statins, Aspirin, Bactrim [sulfamethoxazole-trimethoprim], Charentais melon (french melon), Erythromycin base, Ginkgo biloba, Gluten meal, Peanut butter flavor, Tramadol, Beta vulgaris, Ciprofloxacin, Lisinopril, Soy allergy, Trimethoprim, and Ibuprofen   Social History   Socioeconomic History   Marital status: Married    Spouse name: Not on file   Number of children: Not on file   Years of education: Not on file   Highest education level: Not on file  Occupational History   Occupation: retired  Tobacco Use   Smoking status: Never    Passive exposure: Never   Smokeless tobacco: Never   Tobacco comments:    3 month lifetime smoker  Vaping Use   Vaping Use: Never used  Substance and Sexual Activity   Alcohol use: Yes    Comment: rare   Drug use: Never   Sexual activity: Not on file  Other Topics Concern   Not on file  Social History Narrative   Not on file   Social Determinants of Health   Financial Resource Strain: Not on file  Food Insecurity: Not on file  Transportation Needs: Not on file  Physical Activity: Not on file  Stress: Not on file  Social Connections: Not on file     Family History: The patient's family history includes Atrial fibrillation in her mother; Cancer in her father and mother; Eczema in her mother; Heart failure in her mother.  ROS:   Please see the history of present illness.    (+) Arthralgias All other systems reviewed and are negative.  EKGs/Labs/Other Studies Reviewed:    The following studies were reviewed today:  Recent Labs: 11/16/2021: ALT 36 04/11/2022: Hemoglobin 13.5; Platelets 293 04/25/2022: BUN 20; Creatinine, Ser 0.92; Potassium 4.7; Sodium 140   Recent Lipid Panel    Component Value Date/Time   CHOL 105 03/28/2022 0753   TRIG 166 (H) 03/28/2022 0753   HDL 49 03/28/2022 0753   CHOLHDL 2.1 03/28/2022 0753   CHOLHDL 4.2 11/10/2021 0429   VLDL 33 11/10/2021 0429   LDLCALC 29 03/28/2022 0753    Physical  Exam:    VS:  BP 122/80   Pulse 82   Ht 5' 3.5" (1.613 m)   Wt 191 lb 4.8 oz (86.8 kg)   SpO2 95%   BMI 33.36 kg/m     Wt Readings from Last 3 Encounters:  11/02/22 191 lb 4.8 oz (86.8 kg)  11/01/22 191 lb 3.2 oz (86.7 kg)  04/25/22 180 lb (81.6 kg)     GEN: Well nourished, well developed in no acute distress CARDIAC: RRR, no murmurs, rubs, gallops RESPIRATORY:  Clear to auscultation without rales, wheezing or rhonchi        ASSESSMENT:    1. Acute CVA (  cerebrovascular accident) ([place])   2. Mitral valve insufficiency, unspecified etiology    PLAN:    In order of problems listed above:  #Cryptogenic stroke Loop recorder in situ.  Recorder monitoring has demonstrated no evidence of atrial fibrillation since implant.  There have been episodes falsely labeled atrial fibrillation that are secondary to T wave oversensing. We discussed her ILR today in detail and she wishes to keep it in place.   #Mitral valve disease Follows with Dr. Ali Lowe.  No signs of significant heart failure today on exam.  Follow up prn with EP.   Medication Adjustments/Labs and Tests Ordered: Current medicines are reviewed at length with the patient today.  Concerns regarding medicines are outlined above.   No orders of the defined types were placed in this encounter.  No orders of the defined types were placed in this encounter.  I,Mathew Stumpf,acting as a Education administrator for Connie Epley, MD.,have documented all relevant documentation on the behalf of Connie Epley, MD,as directed by  Connie Epley, MD while in the presence of Connie Epley, MD.  I, Connie Epley, MD, have reviewed all documentation for this visit. The documentation on 11/02/22 for the exam, diagnosis, procedures, and orders are all accurate and complete.   Signed, Lars Mage, MD, [facility], [facility] 11/02/2022 3:51 PM    Electrophysiology [facility]

## 2022-11-02 NOTE — Patient Instructions (Signed)
Medication Instructions:  Your physician recommends that you continue on your current medications as directed. Please refer to the Current Medication list given to you today.  *If you need a refill on your cardiac medications before your next appointment, please call your pharmacy*  Follow-Up: At  HeartCare, you and your health needs are our priority.  As part of our continuing mission to provide you with exceptional heart care, we have created designated Provider Care Teams.  These Care Teams include your primary Cardiologist (physician) and Advanced Practice Providers (APPs -  Physician Assistants and Nurse Practitioners) who all work together to provide you with the care you need, when you need it.  Your next appointment:   As needed with Dr. Lambert 

## 2022-11-13 ENCOUNTER — Other Ambulatory Visit (HOSPITAL_COMMUNITY): Payer: Self-pay | Admitting: Nurse Practitioner

## 2022-11-13 DIAGNOSIS — M5412 Radiculopathy, cervical region: Secondary | ICD-10-CM

## 2022-11-13 DIAGNOSIS — G243 Spasmodic torticollis: Secondary | ICD-10-CM

## 2022-11-18 LAB — CUP PACEART REMOTE DEVICE CHECK
Date Time Interrogation Session: 20240209230845
Implantable Pulse Generator Implant Date: 20230208

## 2022-11-19 ENCOUNTER — Ambulatory Visit: Payer: Medicare HMO

## 2022-11-19 DIAGNOSIS — I639 Cerebral infarction, unspecified: Secondary | ICD-10-CM

## 2022-11-19 NOTE — Progress Notes (Signed)
[facility] Summary Report / Loop Recorder 

## 2022-12-05 ENCOUNTER — Encounter (HOSPITAL_COMMUNITY): Payer: Self-pay

## 2022-12-05 ENCOUNTER — Ambulatory Visit (HOSPITAL_COMMUNITY): Payer: Medicare HMO

## 2022-12-06 ENCOUNTER — Other Ambulatory Visit (HOSPITAL_COMMUNITY): Payer: Self-pay | Admitting: Nurse Practitioner

## 2022-12-06 DIAGNOSIS — M961 Postlaminectomy syndrome, not elsewhere classified: Secondary | ICD-10-CM

## 2022-12-06 DIAGNOSIS — G890 Central pain syndrome: Secondary | ICD-10-CM

## 2022-12-06 DIAGNOSIS — G243 Spasmodic torticollis: Secondary | ICD-10-CM

## 2022-12-06 DIAGNOSIS — M5412 Radiculopathy, cervical region: Secondary | ICD-10-CM

## 2022-12-20 ENCOUNTER — Ambulatory Visit (HOSPITAL_COMMUNITY)
Admission: RE | Admit: 2022-12-20 | Discharge: 2022-12-20 | Disposition: A | Discharge status: Home / Self Care | Payer: Medicare HMO | Source: Ambulatory Visit | Attending: Nurse Practitioner | Admitting: Nurse Practitioner

## 2022-12-20 DIAGNOSIS — M5412 Radiculopathy, cervical region: Secondary | ICD-10-CM | POA: Diagnosis present

## 2022-12-20 DIAGNOSIS — G890 Central pain syndrome: Secondary | ICD-10-CM | POA: Insufficient documentation

## 2022-12-20 DIAGNOSIS — G243 Spasmodic torticollis: Secondary | ICD-10-CM | POA: Diagnosis present

## 2022-12-20 DIAGNOSIS — M961 Postlaminectomy syndrome, not elsewhere classified: Secondary | ICD-10-CM | POA: Diagnosis present

## 2022-12-24 ENCOUNTER — Ambulatory Visit (INDEPENDENT_AMBULATORY_CARE_PROVIDER_SITE_OTHER): Payer: Medicare HMO

## 2022-12-24 DIAGNOSIS — I639 Cerebral infarction, unspecified: Secondary | ICD-10-CM | POA: Diagnosis not present

## 2022-12-25 LAB — CUP PACEART REMOTE DEVICE CHECK
Date Time Interrogation Session: 20240317231301
Implantable Pulse Generator Implant Date: 20230208

## 2023-01-01 NOTE — Progress Notes (Signed)
[facility] Summary Report / Loop Recorder 

## 2023-01-28 ENCOUNTER — Ambulatory Visit (INDEPENDENT_AMBULATORY_CARE_PROVIDER_SITE_OTHER): Payer: Medicare HMO

## 2023-01-28 DIAGNOSIS — I639 Cerebral infarction, unspecified: Secondary | ICD-10-CM

## 2023-01-28 LAB — CUP PACEART REMOTE DEVICE CHECK
Date Time Interrogation Session: 20240419230520
Implantable Pulse Generator Implant Date: 20230208

## 2023-02-01 NOTE — Progress Notes (Signed)
[facility] Summary Report / Loop Recorder 

## 2023-03-05 ENCOUNTER — Ambulatory Visit (INDEPENDENT_AMBULATORY_CARE_PROVIDER_SITE_OTHER): Payer: Medicare HMO

## 2023-03-05 DIAGNOSIS — I639 Cerebral infarction, unspecified: Secondary | ICD-10-CM | POA: Diagnosis not present

## 2023-03-05 LAB — CUP PACEART REMOTE DEVICE CHECK
Date Time Interrogation Session: 20240527230328
Implantable Pulse Generator Implant Date: 20230208

## 2023-03-05 NOTE — Progress Notes (Signed)
[facility] Summary Report / Loop Recorder 

## 2023-03-28 NOTE — Progress Notes (Signed)
[facility] Summary Report / Loop Recorder 

## 2023-04-08 ENCOUNTER — Ambulatory Visit (INDEPENDENT_AMBULATORY_CARE_PROVIDER_SITE_OTHER): Payer: Medicare HMO

## 2023-04-08 DIAGNOSIS — I639 Cerebral infarction, unspecified: Secondary | ICD-10-CM

## 2023-04-08 LAB — CUP PACEART REMOTE DEVICE CHECK
Date Time Interrogation Session: 20240629230442
Implantable Pulse Generator Implant Date: 20230208

## 2023-04-24 NOTE — Progress Notes (Signed)
 Carelink Summary Report / Loop Recorder 

## 2023-04-29 ENCOUNTER — Telehealth: Payer: Self-pay | Admitting: Internal Medicine

## 2023-04-29 NOTE — Telephone Encounter (Signed)
Pt's spouse stated she's going to have a Cortizone shot on her back today and would like to know if it'll interfere with her heart. Please advise

## 2023-04-29 NOTE — Telephone Encounter (Signed)
Patient states she is scheduled to have a Cortisone shot today at 2:15 PM with pain clinic and would like to know if this will interfere with her heart. Performing office is not requesting clearance--patient would like to know for herself.

## 2023-04-29 NOTE — Telephone Encounter (Signed)
There is no contra-indication to her getting a steroid shot

## 2023-04-29 NOTE — Telephone Encounter (Signed)
Called to let patient know.  I heard her come to phone - but then I could not hear her, or anything.  After waiting I hung up and tried her back.  The call went to VM which is not set up.  Unable to leave a message.

## 2023-04-29 NOTE — Telephone Encounter (Signed)
Left message for patient's husband to call back.  

## 2023-04-29 NOTE — Telephone Encounter (Signed)
Patient called again to follow-up on whether getting Cortisone shot for back pain would be OK.

## 2023-05-01 ENCOUNTER — Telehealth: Payer: Self-pay | Admitting: Cardiology

## 2023-05-01 NOTE — Telephone Encounter (Signed)
Pt states she is going in for a cortisone shot and would like to know if that is going to be okay for her to get. Please advise.

## 2023-05-01 NOTE — Telephone Encounter (Signed)
Pt made aware that she will need to call her Providers office doing the cortisone shot, and have them fax our office a cardiac/medication clearance form, for [place] to advise on and get back to them shortly thereafter.   Advised the pt to call their office today and have them fax this form to [phone] ATTN: Dr. Lalla Brothers.   Pt verbalized understanding and agrees with this plan.

## 2023-05-02 NOTE — Telephone Encounter (Signed)
Attempted to call patient. Unable to leave voicemail.  See previous phone note:   Orbie Pyo, MD   04/29/23  7:18 PM  Agree, no issues with steroid injection

## 2023-05-13 ENCOUNTER — Ambulatory Visit (INDEPENDENT_AMBULATORY_CARE_PROVIDER_SITE_OTHER): Payer: Medicare HMO

## 2023-05-13 DIAGNOSIS — I639 Cerebral infarction, unspecified: Secondary | ICD-10-CM

## 2023-05-24 NOTE — Progress Notes (Signed)
[facility] Summary Report / Loop Recorder 

## 2023-06-08 ENCOUNTER — Other Ambulatory Visit: Payer: Self-pay | Admitting: Internal Medicine

## 2023-06-12 LAB — CUP PACEART REMOTE DEVICE CHECK
Date Time Interrogation Session: 20240903230350
Implantable Pulse Generator Implant Date: 20230208

## 2023-06-17 ENCOUNTER — Ambulatory Visit (INDEPENDENT_AMBULATORY_CARE_PROVIDER_SITE_OTHER): Payer: Medicare HMO

## 2023-06-17 DIAGNOSIS — I639 Cerebral infarction, unspecified: Secondary | ICD-10-CM

## 2023-07-01 NOTE — Progress Notes (Signed)
[facility] Summary Report / Loop Recorder 

## 2023-07-22 ENCOUNTER — Ambulatory Visit (INDEPENDENT_AMBULATORY_CARE_PROVIDER_SITE_OTHER): Payer: Medicare HMO

## 2023-07-22 DIAGNOSIS — I639 Cerebral infarction, unspecified: Secondary | ICD-10-CM

## 2023-07-23 LAB — CUP PACEART REMOTE DEVICE CHECK
Date Time Interrogation Session: 20241013230617
Implantable Pulse Generator Implant Date: 20230208

## 2023-07-30 NOTE — Progress Notes (Deleted)
Cardiology Office Note:   Date:  07/30/2023  ID:  Connie Campbell, DOB Feb 11, 1951, MRN [id number] PCP:  Leonard Downing  [facility] Providers Cardiologist:  Alverda Skeans, MD Referring MD: Nathaneil Canary, PA-C  Chief Complaint/Reason for Referral: Cardiology follow-up  PATIENT DID NOT APPEAR FOR APPOINTMENT   ASSESSMENT:    1. Mitral valve insufficiency, unspecified etiology   2. Acute CVA (cerebrovascular accident) (HCC)   3. Type 2 diabetes mellitus with complication, with long-term current use of insulin (HCC)   4. Hypertension associated with diabetes (HCC)   5. Hyperlipidemia associated with type 2 diabetes mellitus (HCC)   6. Aortic atherosclerosis (HCC)   7. Bilateral carotid artery stenosis   8. BMI 33.0-33.9,adult     PLAN:    PATIENT DID NOT APPEAR FOR APPOINTMENT   In order of problems listed above: Mitral regurgitation: Only moderate on CMR.  May be an issue to do a TEE given her cervical spine problems however her history of epiglottal injury is not a limitation. History of stroke: Continue Plavix monotherapy.  The patient continues to follow with the EP with a Linq monitor in place.  I reviewed her recent monitor results and they show no atrial fibrillation. Type 2 diabetes: Continue Plavix, Jardiance, losartan and Repatha. Hypertension: Hyperlipidemia: Check lipid panel, LFTs, LP(a) today. Aortic atherosclerosis: Continue Plavix, statin, blood pressure control. Carotid disease: Continue medical therapy. Elevated BMI: Will refer to pharmacy for recommendations regarding GLP-1 receptor agonist therapy given her history of diabetes.          History of Present Illness:      FOCUSED PROBLEM LIST:   Mitral regurgitation Moderate to severe TTE March 2023 TEE aborted due to history of epiglottal injury Evaluated by ENT April 2022 at [facility] CMR 2023 with moderate mitral regurgitation Late gadolinium enhancement of mid anterolateral and  inferolateral walls Hypertension Hyperlipidemia Statin induced myalgias on Repatha History of prior strokes TTE March 2023 without PFO or ASD Linq monitor in place No atrial fibrillation yet detected Followed by EP Type 2 diabetes on insulin History of DVT 1991 and 2001 in [place] Decision made to defer Coumadin due to wildly fluctuating INRs Frailty Chronic pain, possibly fibromyalgia Coronary artery disease Moderate LAD disease with negative FFR CTA 2023 BMI 33 Carotid disease Mild bilateral disease Dopplers 2023  March 2023: Patient was seen for initial consultation due to hospital follow-up for stroke.  She is referred for bubble study echocardiogram which demonstrated no PFO or ASD.  It did show moderate to severe mitral regurgitation.   April 2023: The patient complained of increasing shortness of breath.  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  She was then referred for cardiac MRI which demonstrated only moderate mitral regurgitation however there was gadolinium enhancement seen in the inferolateral walls with associated hypokinesis with an ejection fraction of 56%.  She was also started on losartan due to high blood pressure.   July 2023: The patient is doing fairly well.  She still suffers from chronic pain that is diffuse in nature.  She denies any chest pain, shortness of breath, palpitations, or paroxysmal nocturnal dyspnea.  She is required no emergency room visits or hospitalizations.  She has had no signs or symptoms of recurrent stroke.  She feels well from a cardiovascular perspective.  Plan: Given myocardial scar on CMR and dyspnea,  refer for coronary CTA.   January 2024:  In the interim, the patient had a coronary CTA demonstrating moderate, nonsignificant LAD disease.  Plan: Continue medical therapy.  October 2024: In the interim the  patient has had her Linq monitor checked a few times which demonstrated no atrial fibrillation.            Current Medications: No outpatient medications have been marked as taking for the 07/31/23 encounter (Appointment) with Orbie Pyo, MD.     Review of Systems:   Please see the history of present illness.    All other systems reviewed and are negative.     EKGs/Labs/Other Test Reviewed:   EKG: EKG performed April 2023 that I reviewed demonstrates sinus rhythm  EKG Interpretation Date/Time:    Ventricular Rate:    PR Interval:    QRS Duration:    QT Interval:    QTC Calculation:   R Axis:      Text Interpretation:             Signed, Orbie Pyo, MD  07/30/2023 6:52 AM    [facility] [address] [place], [place], [place]  [address] Phone: [phone]; Fax: [phone]   Note:  This document was prepared using Dragon voice recognition software and may include unintentional dictation errors.

## 2023-07-31 ENCOUNTER — Ambulatory Visit: Payer: Medicare HMO | Attending: Internal Medicine | Admitting: Internal Medicine

## 2023-07-31 DIAGNOSIS — Z794 Long term (current) use of insulin: Secondary | ICD-10-CM

## 2023-07-31 DIAGNOSIS — E1169 Type 2 diabetes mellitus with other specified complication: Secondary | ICD-10-CM

## 2023-07-31 DIAGNOSIS — I700 Atherosclerosis of aorta: Secondary | ICD-10-CM

## 2023-07-31 DIAGNOSIS — I6523 Occlusion and stenosis of bilateral carotid arteries: Secondary | ICD-10-CM

## 2023-07-31 DIAGNOSIS — Z6833 Body mass index (BMI) 33.0-33.9, adult: Secondary | ICD-10-CM

## 2023-07-31 DIAGNOSIS — E1159 Type 2 diabetes mellitus with other circulatory complications: Secondary | ICD-10-CM

## 2023-07-31 DIAGNOSIS — I340 Nonrheumatic mitral (valve) insufficiency: Secondary | ICD-10-CM

## 2023-07-31 DIAGNOSIS — I639 Cerebral infarction, unspecified: Secondary | ICD-10-CM

## 2023-08-01 ENCOUNTER — Encounter: Payer: Self-pay | Admitting: Internal Medicine

## 2023-08-06 NOTE — Progress Notes (Signed)
[facility] Summary Report / Loop Recorder 

## 2023-08-07 ENCOUNTER — Ambulatory Visit: Payer: Medicare HMO | Attending: Internal Medicine | Admitting: Internal Medicine

## 2023-08-07 ENCOUNTER — Encounter: Payer: Self-pay | Admitting: Internal Medicine

## 2023-08-07 VITALS — BP 124/76 | HR 79 | Ht 63.5 in | Wt 183.4 lb

## 2023-08-07 DIAGNOSIS — E1159 Type 2 diabetes mellitus with other circulatory complications: Secondary | ICD-10-CM

## 2023-08-07 DIAGNOSIS — Z6833 Body mass index (BMI) 33.0-33.9, adult: Secondary | ICD-10-CM

## 2023-08-07 DIAGNOSIS — I340 Nonrheumatic mitral (valve) insufficiency: Secondary | ICD-10-CM

## 2023-08-07 DIAGNOSIS — E118 Type 2 diabetes mellitus with unspecified complications: Secondary | ICD-10-CM | POA: Diagnosis not present

## 2023-08-07 DIAGNOSIS — I152 Hypertension secondary to endocrine disorders: Secondary | ICD-10-CM

## 2023-08-07 DIAGNOSIS — Z794 Long term (current) use of insulin: Secondary | ICD-10-CM

## 2023-08-07 DIAGNOSIS — I6523 Occlusion and stenosis of bilateral carotid arteries: Secondary | ICD-10-CM

## 2023-08-07 DIAGNOSIS — I639 Cerebral infarction, unspecified: Secondary | ICD-10-CM | POA: Diagnosis not present

## 2023-08-07 DIAGNOSIS — E1169 Type 2 diabetes mellitus with other specified complication: Secondary | ICD-10-CM

## 2023-08-07 DIAGNOSIS — I700 Atherosclerosis of aorta: Secondary | ICD-10-CM

## 2023-08-07 DIAGNOSIS — E785 Hyperlipidemia, unspecified: Secondary | ICD-10-CM

## 2023-08-07 MED ORDER — FUROSEMIDE 20 MG PO TABS: 20.0000 mg | ORAL_TABLET | Freq: Every day | ORAL | 3 refills | Status: DC | PRN

## 2023-08-07 NOTE — Patient Instructions (Signed)
Medication Instructions:  Your physician has recommended you make the following change in your medication:  1.) start furosemide (Lasix) 20 mg - one tablet only as needed for shortness of breath  *If you need a refill on your cardiac medications before your next appointment, please call your pharmacy*   Lab Work: Today; lipids, Lp(a)  If you have labs (blood work) drawn today and your tests are completely normal, you will receive your results only by: MyChart Message (if you have MyChart) OR A paper copy in the mail If you have any lab test that is abnormal or we need to change your treatment, we will call you to review the results.   Testing/Procedures: none   Follow-Up: At [facility], you and your health needs are our priority.  As part of our continuing mission to provide you with exceptional heart care, we have created designated Provider Care Teams.  These Care Teams include your primary Cardiologist (physician) and Advanced Practice Providers (APPs -  Physician Assistants and Nurse Practitioners) who all work together to provide you with the care you need, when you need it.  We recommend signing up for the patient portal called "MyChart".  Sign up information is provided on this After Visit Summary.  MyChart is used to connect with patients for Virtual Visits (Telemedicine).  Patients are able to view lab/test results, encounter notes, upcoming appointments, etc.  Non-urgent messages can be sent to your provider as well.   To learn more about what you can do with MyChart, go to [email or web address].    Your next appointment:   12 month(s)  Provider:   Orbie Pyo, MD

## 2023-08-07 NOTE — Progress Notes (Signed)
Cardiology Office Note:   Date:  08/07/2023  ID:  Connie Campbell, DOB 1951-03-14, MRN [id number] PCP:  Leonard Downing  [facility] Providers Cardiologist:  Alverda Skeans, MD Referring MD: Nathaneil Canary, PA-C  Chief Complaint/Reason for Referral:  Cardiology follow up ASSESSMENT:    1. Mitral valve insufficiency, unspecified etiology   2. Acute CVA (cerebrovascular accident) (HCC)   3. Type 2 diabetes mellitus with complication, with long-term current use of insulin (HCC)   4. Hypertension associated with diabetes (HCC)   5. Hyperlipidemia associated with type 2 diabetes mellitus (HCC)   6. Aortic atherosclerosis (HCC)   7. Bilateral carotid artery stenosis   8. BMI 33.0-33.9,adult     PLAN:   In order of problems listed above: Mitral regurgitation: Only moderate on CMR.  She has occasional shortness of breath when she lays down.  This happens maybe 2 or 3 times a month.  She does not seem to bothered by this.  Will start Lasix 20 mg as needed.  She does not have an exuberant murmur.  Continue to monitor.  If we need to obtain a TEE we would have to refer her back to her ENT at [facility]. History of stroke: Continue Plavix monotherapy.  The patient continues to follow with the EP with a Linq monitor in place.  I reviewed her recent monitor results and they show no atrial fibrillation. Type 2 diabetes: Continue Plavix, Jardiance, losartan and Repatha. Hypertension: Blood pressure is well-controlled today. Hyperlipidemia: Check lipid panel, LP(a) today. Aortic atherosclerosis: Continue Plavix, statin, blood pressure control. Carotid disease: Continue medical therapy. Elevated BMI: Continue Ozempic.            Dispo:  No follow-ups on file.      Medication Adjustments/Labs and Tests Ordered: Current medicines are reviewed at length with the patient today.  Concerns regarding medicines are outlined above.  The following changes have been made:     Labs/tests  ordered: Orders Placed This Encounter  Procedures   Lipid Profile   Lipoprotein A (LPA)   EKG 12-Lead    Medication Changes: Meds ordered this encounter  Medications   furosemide (LASIX) 20 MG tablet    Sig: Take 1 tablet (20 mg total) by mouth daily as needed.    Dispense:  30 tablet    Refill:  3    Current medicines are reviewed at length with the patient today.  The patient does not have concerns regarding medicines.  I spent 32 minutes reviewing all clinical data during and prior to this visit including all relevant imaging studies, laboratories, clinical information from other health systems, and prior notes from both Cardiology and other specialties, interviewing the patient, and conducting a complete physical examination in order to formulate a comprehensive and personalized evaluation and treatment plan.  History of Present Illness:      FOCUSED PROBLEM LIST:   Mitral regurgitation Moderate to severe TTE March 2023 TEE aborted due to history of epiglottal injury Evaluated by ENT April 2022 [name]) at [facility] CMR 2023 with moderate mitral regurgitation Late gadolinium enhancement of mid anterolateral and inferolateral walls Hypertension Hyperlipidemia Statin induced myalgias on Repatha History of prior strokes TTE March 2023 without PFO or ASD Linq monitor in place No atrial fibrillation yet detected Followed by EP Type 2 diabetes on insulin History of DVT 1991 and 2001 in [place] Decision made to defer Coumadin due to wildly fluctuating INRs Frailty Chronic pain, possibly fibromyalgia Coronary artery disease Moderate LAD  disease with negative FFR CTA 2023 BMI 33 Carotid disease Mild bilateral disease Dopplers 2023  March 2023: Patient was seen for initial consultation due to hospital follow-up for stroke.  She is referred for bubble study echocardiogram which demonstrated no PFO or ASD.  It did show moderate to severe mitral regurgitation.    April 2023: The patient complained of increasing shortness of breath.  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  She was then referred for cardiac MRI which demonstrated only moderate mitral regurgitation however there was gadolinium enhancement seen in the inferolateral walls with associated hypokinesis with an ejection fraction of 56%.  She was also started on losartan due to high blood pressure.   July 2023: The patient is doing fairly well.  She still suffers from chronic pain that is diffuse in nature.  She denies any chest pain, shortness of breath, palpitations, or paroxysmal nocturnal dyspnea.  She is required no emergency room visits or hospitalizations.  She has had no signs or symptoms of recurrent stroke.  She feels well from a cardiovascular perspective.  Plan: Given myocardial scar on CMR and dyspnea, refer for coronary CTA.   January 2024:  In the interim, the patient had a coronary CTA demonstrating moderate, nonsignificant LAD disease.  Plan: Continue medical therapy.  October 2024: In the interim the patient has had her Linq monitor checked a few times which demonstrated no atrial fibrillation.  She tells me that maybe 2 or 3 times a month she will get short of breath.  This typically happens when she lays down.  She does not remember an association with anything she has eaten such as a salty meal.  She denies any peripheral edema around these times.  She is unsure whether this is related to back pain issues or not.  Apparently she will lay down in the shortness of breath will go away on its own.  She denies any chest pain.  She has had no severe bleeding or bruising.  She denies any signs or symptoms of stroke.  She continues to tolerate her medications well including Ozempic.  Interestingly she feels better after starting Repatha.           Current  Medications: Current Meds  Medication Sig   baclofen (LIORESAL) 10 MG tablet Take 10 mg by mouth daily as needed for muscle spasms.   carisoprodol (SOMA) 350 MG tablet Take 1 tablet (350 mg total) by mouth daily as needed (severe muscle spasms).   cetirizine (ZYRTEC) 10 MG tablet Take 10 mg by mouth daily.   clopidogrel (PLAVIX) 75 MG tablet Take 1 tablet (75 mg total) by mouth daily.   Evolocumab (REPATHA SURECLICK) 140 MG/ML SOAJ Inject 140 mg into the skin every 14 (fourteen) days.   furosemide (LASIX) 20 MG tablet Take 1 tablet (20 mg total) by mouth daily as needed.   JARDIANCE 10 MG TABS tablet TAKE 1 TABLET BY MOUTH DAILY BEFORE BREAKFAST.   levETIRAcetam (KEPPRA) 750 MG tablet Take 750 mg by mouth 2 (two) times daily.   losartan (COZAAR) 25 MG tablet Take 1 tablet (25 mg total) by mouth daily.   magnesium gluconate (MAGONATE) 500 MG tablet TAKE 1 TABLET BY MOUTH AT BEDTIME.   metFORMIN (GLUCOPHAGE) 1000 MG tablet Take 1 tablet (1,000 mg total) by mouth 2 (two) times daily with a meal.   Multiple  Vitamin (MULTIVITAMIN) capsule Take 1 capsule by mouth daily.   naloxone (NARCAN) nasal spray 4 mg/0.1 mL Place into the nose.   omeprazole (PRILOSEC) 40 MG capsule Take 40 mg by mouth daily.   oxyCODONE-acetaminophen (PERCOCET) 10-325 MG tablet Take 1 tablet by mouth 4 (four) times daily as needed for pain.   OZEMPIC, 1 MG/DOSE, 4 MG/3ML SOPN Inject into the skin.   polyethylene glycol powder (GLYCOLAX/MIRALAX) 17 GM/SCOOP powder Take by mouth.   polyvinyl alcohol (LIQUIFILM TEARS) 1.4 % ophthalmic solution Place 1 drop into both eyes as needed for dry eyes.   simethicone (MYLICON) 125 MG chewable tablet Chew 125 mg by mouth every 6 (six) hours as needed for flatulence.   sitaGLIPtin (JANUVIA) 100 MG tablet Take 100 mg by mouth daily.   SYNTHROID 100 MCG tablet Take 100 mcg by mouth daily.   timolol (TIMOPTIC) 0.5 % ophthalmic solution Place 1 drop into the right eye 2 (two) times daily.    TOUJEO MAX SOLOSTAR 300 UNIT/ML Solostar Pen Inject into the skin.   valACYclovir (VALTREX) 500 MG tablet Take by mouth.     Review of Systems:   Please see the history of present illness.    All other systems reviewed and are negative.     EKGs/Labs/Other Test Reviewed:   EKG:  EKG performed April 2023 that I reviewed demonstrates sinus rhythm   EKG Interpretation Date/Time:    Ventricular Rate:    PR Interval:    QRS Duration:    QT Interval:    QTC Calculation:   R Axis:      Text Interpretation:           Risk Assessment/Calculations:          Physical Exam:   VS:  BP 124/76   Pulse 79   Ht 5' 3.5" (1.613 m)   Wt 183 lb 6.4 oz (83.2 kg)   SpO2 96%   BMI 31.98 kg/m        Wt Readings from Last 3 Encounters:  08/07/23 183 lb 6.4 oz (83.2 kg)  11/02/22 191 lb 4.8 oz (86.8 kg)  11/01/22 191 lb 3.2 oz (86.7 kg)      GENERAL:  No apparent distress, AOx3 HEENT:  No carotid bruits, +2 carotid impulses, no scleral icterus CAR: RRR short systolic murmur, no gallops, rubs, or thrills RES:  Clear to auscultation bilaterally ABD:  Soft, nontender, nondistended, positive bowel sounds x 4 VASC:  + 1 right radial pulse, +2 left radial pulse, +2 carotid pulses NEURO:  CN 2-12 grossly intact; motor and sensory grossly intact PSYCH:  No active depression or anxiety EXT:  No edema, ecchymosis, or cyanosis  Signed, Orbie Pyo, MD  08/07/2023 9:25 AM    [facility] [address] [place], [place], [place]  [address] Phone: [phone]; Fax: [phone]   Note:  This document was prepared using Dragon voice recognition software and may include unintentional dictation errors.

## 2023-08-08 ENCOUNTER — Other Ambulatory Visit: Payer: Self-pay | Admitting: Internal Medicine

## 2023-08-08 LAB — LIPID PANEL
Chol/HDL Ratio: 2.3 ratio (ref 0.0–4.4)
Cholesterol, Total: 117 mg/dL (ref 100–199)
HDL: 51 mg/dL (ref >39)
LDL Chol Calc (NIH): 32 mg/dL (ref 0–99)
Triglycerides: 218 mg/dL — ABNORMAL HIGH (ref 0–149)
VLDL Cholesterol Cal: 34 mg/dL (ref 5–40)

## 2023-08-08 LAB — LIPOPROTEIN A (LPA): Lipoprotein (a): <8.4 nmol/L (ref <75.0)

## 2023-08-09 ENCOUNTER — Encounter: Payer: Self-pay | Admitting: *Deleted

## 2023-08-26 ENCOUNTER — Ambulatory Visit (INDEPENDENT_AMBULATORY_CARE_PROVIDER_SITE_OTHER): Payer: Medicare Other

## 2023-08-26 DIAGNOSIS — I639 Cerebral infarction, unspecified: Secondary | ICD-10-CM | POA: Diagnosis not present

## 2023-08-26 LAB — CUP PACEART REMOTE DEVICE CHECK
Date Time Interrogation Session: 20241117231533
Implantable Pulse Generator Implant Date: 20230208

## 2023-09-18 NOTE — Progress Notes (Signed)
[facility] Summary Report / Loop Recorder 

## 2023-09-20 ENCOUNTER — Ambulatory Visit: Payer: Medicare HMO | Admitting: Internal Medicine

## 2023-09-30 ENCOUNTER — Ambulatory Visit (INDEPENDENT_AMBULATORY_CARE_PROVIDER_SITE_OTHER): Payer: Medicare HMO

## 2023-09-30 DIAGNOSIS — I639 Cerebral infarction, unspecified: Secondary | ICD-10-CM

## 2023-09-30 LAB — CUP PACEART REMOTE DEVICE CHECK
Date Time Interrogation Session: 20241222230916
Implantable Pulse Generator Implant Date: 20230208

## 2023-10-21 ENCOUNTER — Telehealth (HOSPITAL_COMMUNITY): Payer: Self-pay | Admitting: Physician Assistant

## 2023-10-21 ENCOUNTER — Telehealth: Payer: Self-pay

## 2023-10-21 NOTE — Telephone Encounter (Addendum)
 Alert received from [employer] for There was a 46 minute episode of AF, ? OAC,  ventricular rates are 100-120 bpm with AF.  Reviewed with Dr. Cindie who agrees AF noted and recommend AF clinic referral. Patient called advised of findings and agreeable to AF clinic to discuss OAC.   PT states [phone] number does not have option to leave VM but if patient does not answer call daughter Meg Epstein) at [phone] and can leave a VM and she will have patient call back.

## 2023-10-21 NOTE — Telephone Encounter (Signed)
 Lm to call back

## 2023-10-23 ENCOUNTER — Ambulatory Visit (HOSPITAL_COMMUNITY)
Admission: RE | Admit: 2023-10-23 | Discharge: 2023-10-23 | Disposition: A | Discharge status: Home / Self Care | Payer: Medicare HMO | Source: Ambulatory Visit | Attending: Internal Medicine | Admitting: Internal Medicine

## 2023-10-23 VITALS — BP 126/64 | HR 83 | Ht 63.5 in | Wt 190.0 lb

## 2023-10-23 DIAGNOSIS — I480 Paroxysmal atrial fibrillation: Secondary | ICD-10-CM

## 2023-10-23 DIAGNOSIS — D6869 Other thrombophilia: Secondary | ICD-10-CM | POA: Diagnosis not present

## 2023-10-23 DIAGNOSIS — I4891 Unspecified atrial fibrillation: Secondary | ICD-10-CM | POA: Diagnosis not present

## 2023-10-23 DIAGNOSIS — E785 Hyperlipidemia, unspecified: Secondary | ICD-10-CM | POA: Insufficient documentation

## 2023-10-23 DIAGNOSIS — I10 Essential (primary) hypertension: Secondary | ICD-10-CM | POA: Diagnosis not present

## 2023-10-23 DIAGNOSIS — G500 Trigeminal neuralgia: Secondary | ICD-10-CM | POA: Diagnosis not present

## 2023-10-23 DIAGNOSIS — E119 Type 2 diabetes mellitus without complications: Secondary | ICD-10-CM | POA: Insufficient documentation

## 2023-10-23 DIAGNOSIS — Z7901 Long term (current) use of anticoagulants: Secondary | ICD-10-CM | POA: Insufficient documentation

## 2023-10-23 MED ORDER — APIXABAN 5 MG PO TABS: 5.0000 mg | ORAL_TABLET | Freq: Two times a day (BID) | ORAL | 3 refills | Status: DC

## 2023-10-23 NOTE — Patient Instructions (Signed)
 STOP plavix   Stop Carbamazepin - speak with your primary care to have a different medication option    Start Eliquis  5mg  twice a day  Have blood drawn in 1 month to check your blood level - order attached.    Beginning in 2025, the prescription drug law requires all Medicare prescription drug plans (Medicare Part D plans)--including both standalone Medicare prescription drug plans and Medicare Advantage plans with prescription drug coverage--to offer Part D enrollees the option to pay out-of-pocket prescription drug costs in the form of monthly payments instead of all at once at the pharmacy. This program, called the Medicare Prescription Payment Plan, will be helpful for people with high cost sharing earlier in the plan year by spreading out those expenses over the course of the plan year.  What is the Medicare Prescription Payment Plan? The Medicare Prescription Payment Plan is a new payment option created under the Inflation Reduction Act that requires Part D plan sponsors to provide their enrollees with the option to pay out-of-pocket prescription drug costs in the form of monthly payments over the course of the plan year instead of all at once to the pharmacy. The program begins on October 09, 2023.  Program participants will pay $0 to the pharmacy for covered Part D drugs, and Part D plan sponsors will then bill program participants monthly for any cost sharing they incur while in the program. Pharmacies will be paid in full by the Part D sponsor in accordance with Part D prompt payment requirements  If there are issues with costs of your medications with new medicare changes ---call your DRUG INSURANCE to discuss payment plan to reduce your monthly costs.

## 2023-10-23 NOTE — Progress Notes (Signed)
 Primary Care Physician: Calton Catholic, PA-C Primary Cardiologist: Kyra Phy, MD Electrophysiologist: Boyce Byes, MD     Referring Physician: Device clinic     Connie Campbell is a 73 y.o. female with a history of MR, CVA, T2DM, HTN, HLD, aortic atherosclerosis, carotid artery stenosis, and paroxysmal atrial fibrillation who presents for consultation in the [facility]. Device alert on 10/21/23 showing new Afib with RVR for 46 minutes. Patient has a CHADS2VASC score of 7.  On evaluation today, she is currently in NSR. She did not have cardiac awareness of arrhythmia and was surprised to get a phone call from the device clinic. She does not drink alcohol  and drinks maybe 1 cup of coffee daily that is caffeinated.   Today, she denies symptoms of palpitations, chest pain, shortness of breath, orthopnea, PND, lower extremity edema, dizziness, presyncope, syncope, snoring, daytime somnolence, bleeding, or neurologic sequela. The patient is tolerating medications without difficulties and is otherwise without complaint today.    she has a BMI of Body mass index is 33.13 kg/m.Aaron Aas Filed Weights   10/23/23 0922  Weight: 86.2 kg    Current Outpatient Medications  Medication Sig Dispense Refill   apixaban  (ELIQUIS ) 5 MG TABS tablet Take 1 tablet (5 mg total) by mouth 2 (two) times daily. 60 tablet 3   baclofen  (LIORESAL ) 10 MG tablet Take 10 mg by mouth daily as needed for muscle spasms.     carisoprodol  (SOMA ) 350 MG tablet Take 1 tablet (350 mg total) by mouth daily as needed (severe muscle spasms). 30 tablet 0   Evolocumab  (REPATHA  SURECLICK) 140 MG/ML SOAJ Inject 140 mg into the skin every 14 (fourteen) days. 6 mL 3   furosemide  (LASIX ) 20 MG tablet Take 1 tablet (20 mg total) by mouth daily as needed. 30 tablet 3   JARDIANCE  10 MG TABS tablet TAKE 1 TABLET BY MOUTH DAILY BEFORE BREAKFAST. 90 tablet 3   levETIRAcetam  (KEPPRA ) 750 MG tablet Take 750 mg  by mouth every morning.     losartan  (COZAAR ) 25 MG tablet Take 1 tablet (25 mg total) by mouth daily. 90 tablet 3   magnesium  gluconate (MAGONATE) 500 MG tablet TAKE 1 TABLET BY MOUTH AT BEDTIME. 90 tablet 3   metFORMIN  (GLUCOPHAGE ) 1000 MG tablet Take 1 tablet (1,000 mg total) by mouth 2 (two) times daily with a meal. 60 tablet 0   Multiple Vitamin (MULTIVITAMIN) capsule Take 1 capsule by mouth daily.     naloxone (NARCAN) nasal spray 4 mg/0.1 mL Place into the nose.     omeprazole (PRILOSEC) 40 MG capsule Take 40 mg by mouth as needed.     oxyCODONE -acetaminophen  (PERCOCET) 10-325 MG tablet Take 1 tablet by mouth 4 (four) times daily as needed for pain.     OZEMPIC , 1 MG/DOSE, 4 MG/3ML SOPN Inject into the skin.     polyethylene glycol powder (GLYCOLAX /MIRALAX ) 17 GM/SCOOP powder Take by mouth as needed.     polyvinyl alcohol  (LIQUIFILM TEARS) 1.4 % ophthalmic solution Place 1 drop into both eyes as needed for dry eyes. 15 mL 0   Semaglutide , 1 MG/DOSE, (OZEMPIC , 1 MG/DOSE,) 4 MG/3ML SOPN See admin instructions. Patient thinks it is 1 mg once daily     simethicone (MYLICON) 125 MG chewable tablet Chew 125 mg by mouth every 6 (six) hours as needed for flatulence.     sitaGLIPtin (JANUVIA) 100 MG tablet Take 100 mg by mouth daily.     SYNTHROID   100 MCG tablet Take 100 mcg by mouth daily.     timolol  (TIMOPTIC ) 0.5 % ophthalmic solution Place 1 drop into the right eye 2 (two) times daily.     TOUJEO MAX SOLOSTAR 300 UNIT/ML Solostar Pen Inject 48 Units into the skin every morning.     valACYclovir (VALTREX) 500 MG tablet Take by mouth as needed. For shingle out break     OZEMPIC , 0.25 OR 0.5 MG/DOSE, 2 MG/1.5ML SOPN Inject 0.5 mg into the skin once a week. On thursdays (Patient not taking: Reported on 10/23/2023) 1.5 mL 0   No current facility-administered medications for this encounter.    Atrial Fibrillation Management history:  Previous antiarrhythmic drugs: none Previous cardioversions:  none Previous ablations: none Anticoagulation history: none   ROS- All systems are reviewed and negative except as per the HPI above.  Physical Exam: BP 126/64   Pulse 83   Ht 5' 3.5" (1.613 m)   Wt 86.2 kg   BMI 33.13 kg/m   GEN: Well nourished, well developed in no acute distress NECK: No JVD; No carotid bruits CARDIAC: Regular rate and rhythm, no murmurs, rubs, gallops RESPIRATORY:  Clear to auscultation without rales, wheezing or rhonchi  ABDOMEN: Soft, non-tender, non-distended EXTREMITIES:  No edema; No deformity   EKG today demonstrates  Vent. rate 83 BPM PR interval 146 ms QRS duration 78 ms QT/QTcB 362/425 ms P-R-T axes 53 -32 50 Normal sinus rhythm Left axis deviation Abnormal ECG When compared with ECG of 07-Aug-2023 08:57, PREVIOUS ECG IS PRESENT  Echo 12/26/21 demonstrated   1. Left ventricular ejection fraction, by estimation, is 55 to 60%. The  left ventricle has normal function. The left ventricle has no regional  wall motion abnormalities. Left ventricular diastolic parameters are  consistent with Grade II diastolic  dysfunction (pseudonormalization).   2. Right ventricular systolic function is normal. The right ventricular  size is normal. There is normal pulmonary artery systolic pressure. The  estimated right ventricular systolic pressure is 33.0 mmHg.   3. Left atrial size was mildly dilated.   4. The mitral valve is abnormal. Moderate to severe mitral valve  regurgitation, cannot rule out partial flail but not clearly visualized.  Consider TEE. No evidence of mitral stenosis.   5. The aortic valve is tricuspid. There is mild calcification of the  aortic valve. Aortic valve regurgitation is trivial. Aortic valve  sclerosis is present, with no evidence of aortic valve stenosis.   6. The inferior vena cava is normal in size with greater than 50%  respiratory variability, suggesting right atrial pressure of 3 mmHg.   7. No PFO or ASD by bubble  study.   ASSESSMENT & PLAN CHA2DS2-VASc Score = 7  The patient's score is based upon: CHF History: 0 HTN History: 1 Diabetes History: 1 Stroke History: 2 Vascular Disease History: 1 Age Score: 1 Gender Score: 1       ASSESSMENT AND PLAN: Paroxysmal Atrial Fibrillation (ICD10:  I48.0) The patient's CHA2DS2-VASc score is 7, indicating a 11.2% annual risk of stroke.    She is in NSR. Education provided about Afib and triggers. After discussion, we will proceed with conservative observation at this time via ILR checks. Rhythm monitoring device recommended.    Secondary Hypercoagulable State (ICD10:  D68.69) The patient is at significant risk for stroke/thromboembolism based upon her CHA2DS2-VASc Score of 7.  Start Apixaban  (Eliquis ).   Discussion of risks vs benefits of OAC and after discussion patient would like to begin OAC. Stop  Plavix . Begin Eliquis  5 mg BID. Draw CBC in 1 month - patient prefers to draw at PCP office. I advised patient carbamazepine is contraindicated with Eliquis  and will have to ask prescribing provider for different treatment for trigeminal neuralgia.    Follow up Afib clinic prn.   Minnie Amber, PA-C  [facility] [address] [place], [place] [address] [phone]

## 2023-10-25 IMAGING — MR MR MRA HEAD W/O CM
2 series · 19 of 48 positions shown · non-contrast
Comparison: No pertinent prior exam.

CLINICAL DATA: Acute stroke on MRI brain

EXAM:
MRA HEAD WITHOUT CONTRAST
TECHNIQUE: Angiographic images of the Circle of Willis were acquired using MRA
technique without intravenous contrast.

[Series 3: (id) mt fs · axial · 1.4mm · 0.43mm/px · z∈[-55,+35]mm · 18 of 136 slices shown]
[im 1/136]
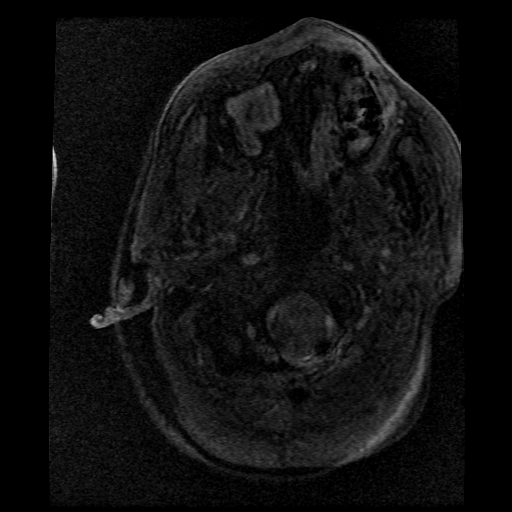
[im 3/136]
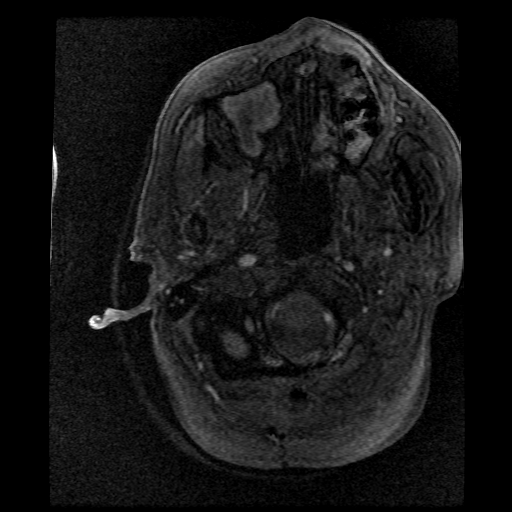
[im 6/136]
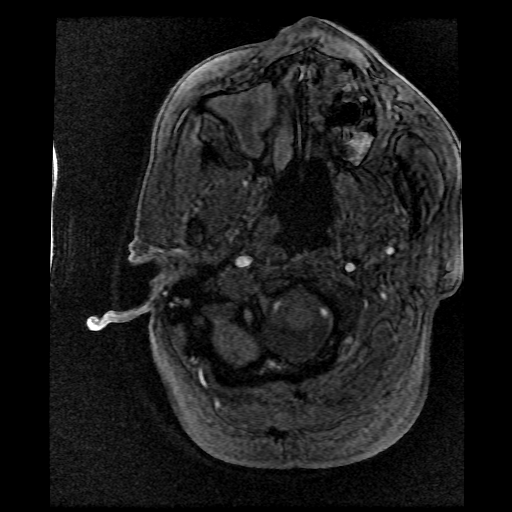
[im 9/136]
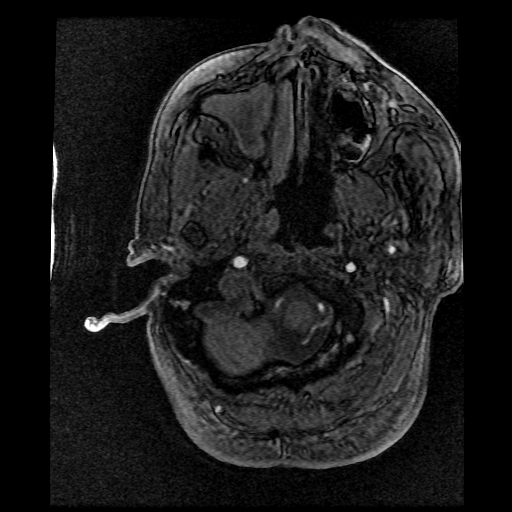
[im 12/136]
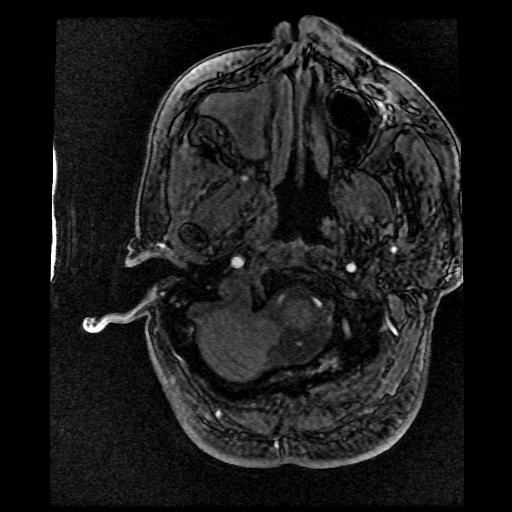
[im 15/136]
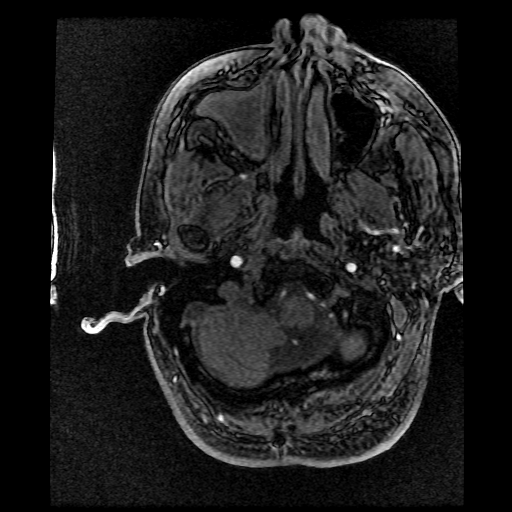
[im 18/136]
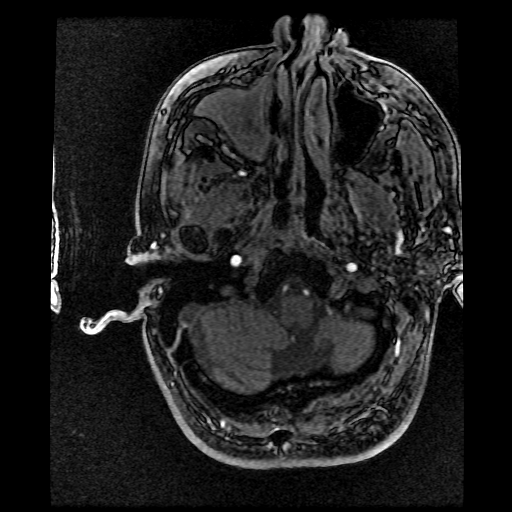
[im 21/136]
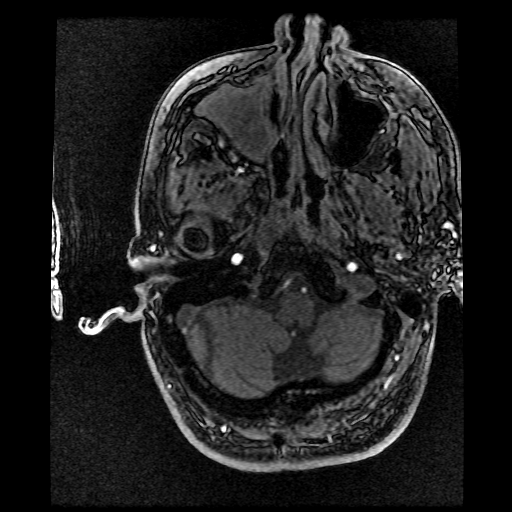
[im 24/136]
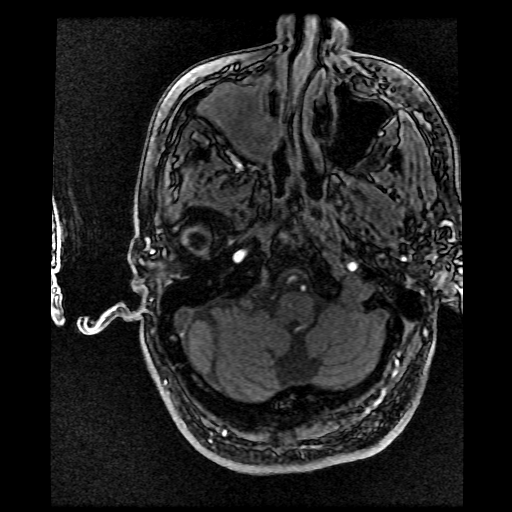
[im 27/136]
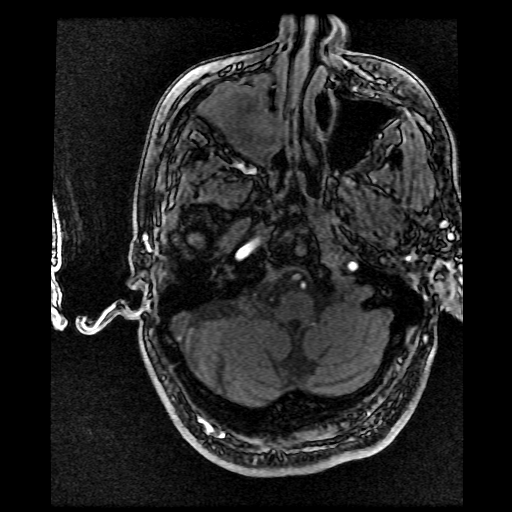
[im 42/136]
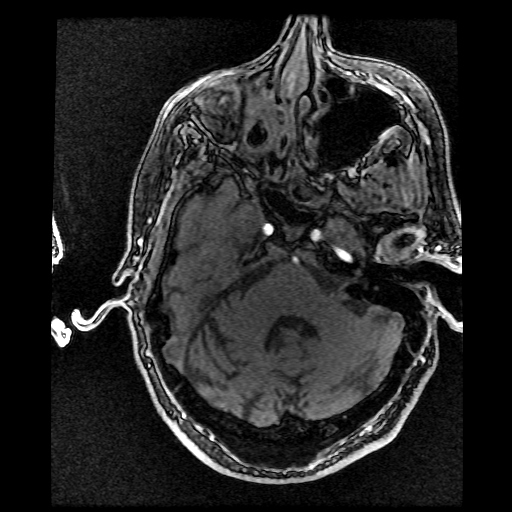
[im 59/136]
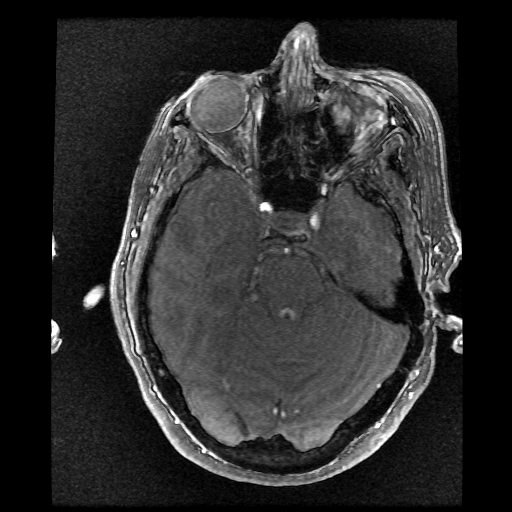
[im 68/136]
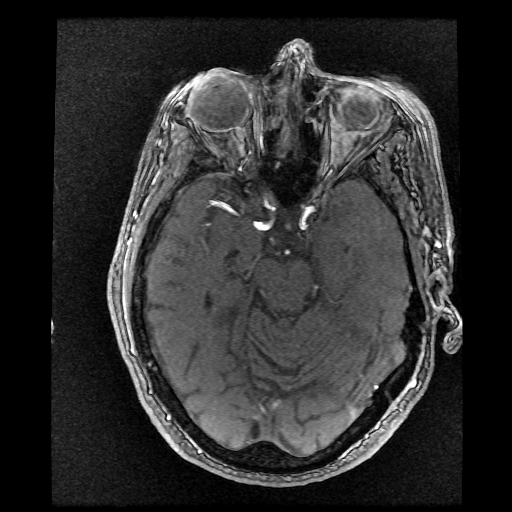
[im 77/136]
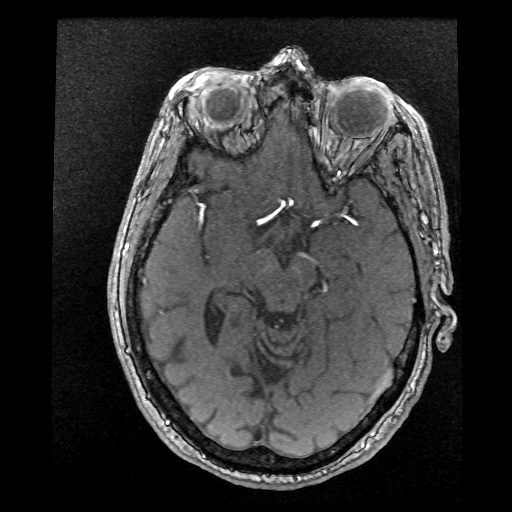
[im 94/136]
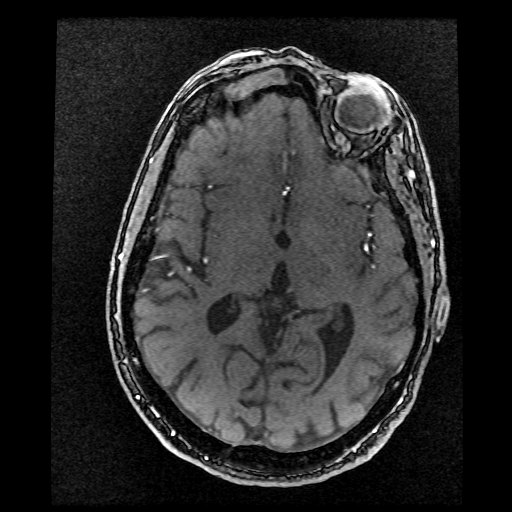
[im 112/136]
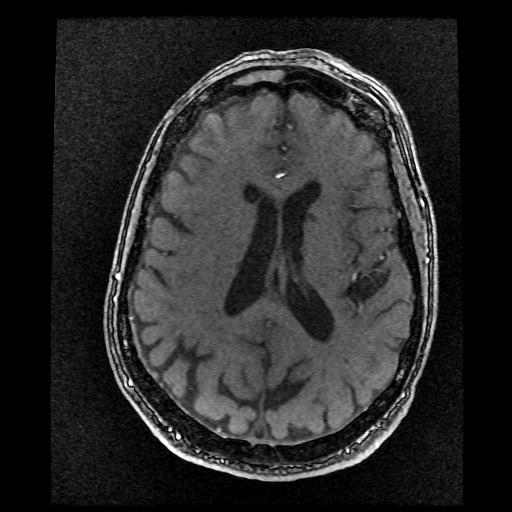
[im 115/136]
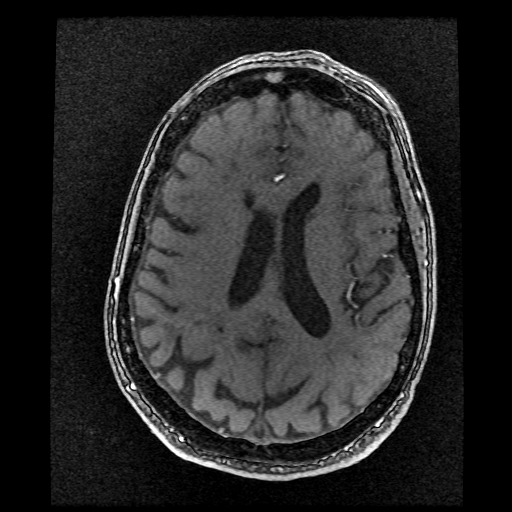
[im 130/136]
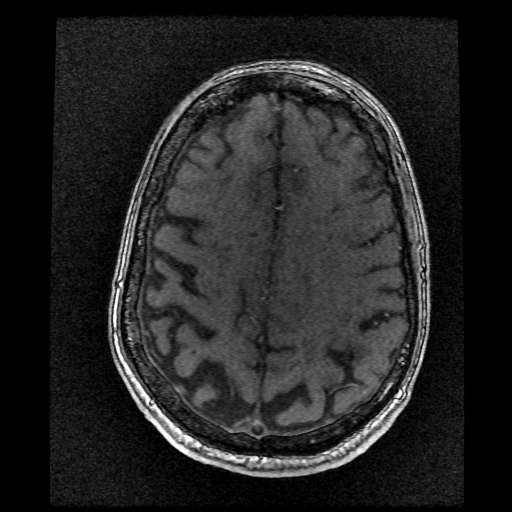

[Series 300: col:(id) mt fs · axial · 1.4mm · 0.43mm/px · 1 of 1 slices shown]
[im 1/1]
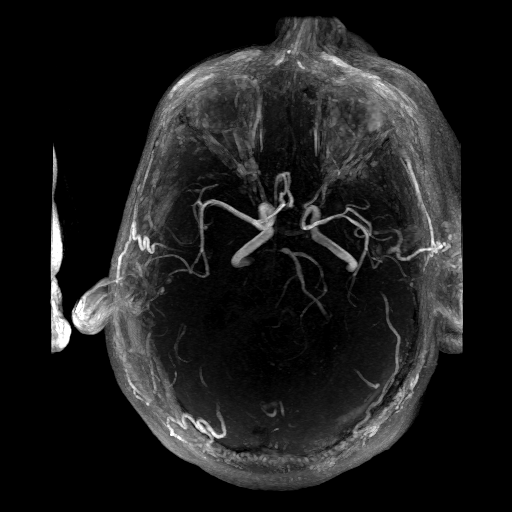

[19 of 48 positions shown; findings below may reference images not displayed]

FINDINGS: Anterior circulation: Intracranial internal carotid arteries are
patent. Anterior and middle cerebral arteries are patent. Left A1
ACA is congenitally absent or hypoplastic.

Posterior circulation: Intracranial vertebral arteries, basilar
artery, and posterior cerebral arteries are patent. Fetal origin of
the right PCA. Asymmetric relatively poor flow related enhancement
of the right posterior communicating artery and posterior cerebral
artery.
IMPRESSION: No proximal intracranial vessel occlusion.

Asymmetric poor flow related enhancement of right posterior
communicating artery and posterior cerebral artery. May be on a
technical basis given lack of acute findings in this distribution on
the MRI.

## 2023-10-25 IMAGING — MR MR HEAD W/O CM
9 of 10 series · 35 of 48 positions shown · non-contrast
Comparison: None.

CLINICAL DATA: Neuro deficit, acute, stroke suspected. Right-sided
weakness and dizziness >24 hours; hx of right side weakness from
prior CVA.

EXAM:
MRI HEAD WITHOUT CONTRAST
TECHNIQUE: Multiplanar, multiecho pulse sequences of the brain and surrounding
structures were obtained without intravenous contrast.

[Series 3: DWI · axial · 3.0mm · 1.09mm/px · z∈[-69,+65]mm · 8 of 92 slices shown (1 of 4)]
[im 1/92]
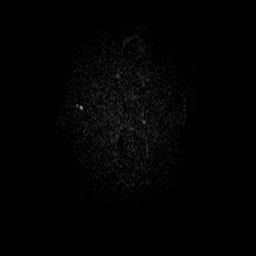
[im 11/92]
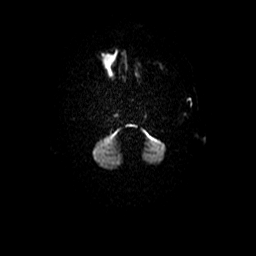
[im 31/92]
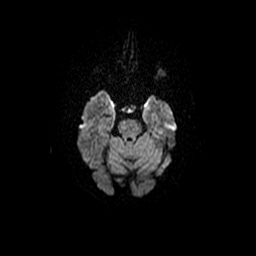
[im 41/92]
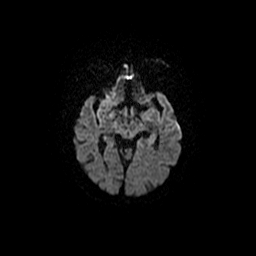
[im 51/92]
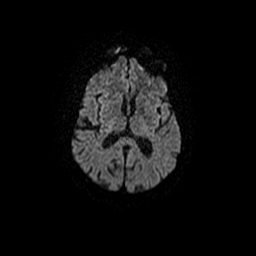
[im 61/92]
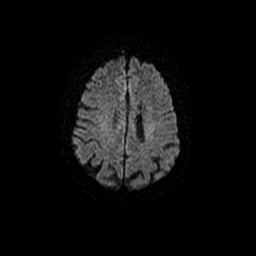
[im 81/92]
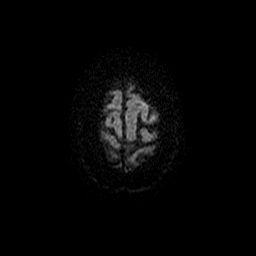
[im 92/92]
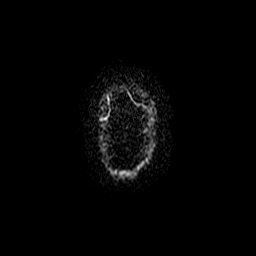

[Series 4: DWI · coronal · 5.0mm · 1.09mm/px · 7 of 64 slices shown (2 of 4)]
[im 1/64]
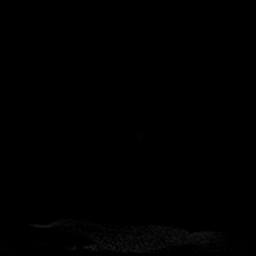
[im 11/64]
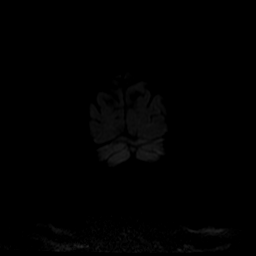
[im 22/64]
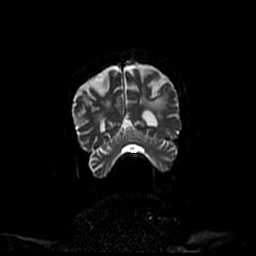
[im 32/64]
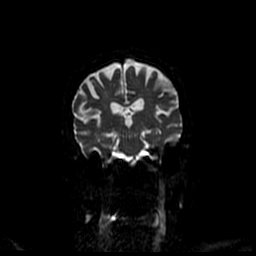
[im 43/64]
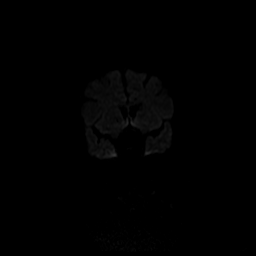
[im 53/64]
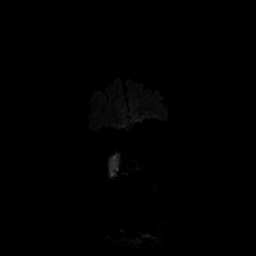
[im 64/64]
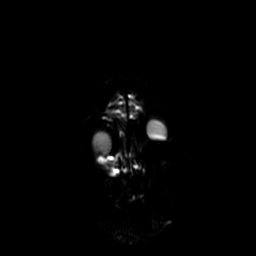

[Series 5: T1 · sagittal · 5.0mm · 0.47mm/px · 3 of 23 slices shown (1 of 2)]
[im 1/23]
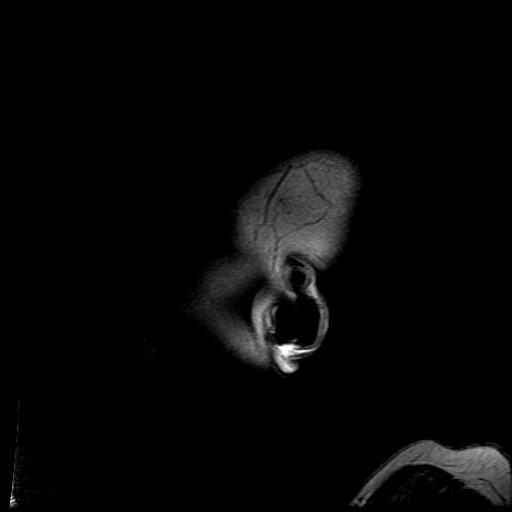
[im 12/23]
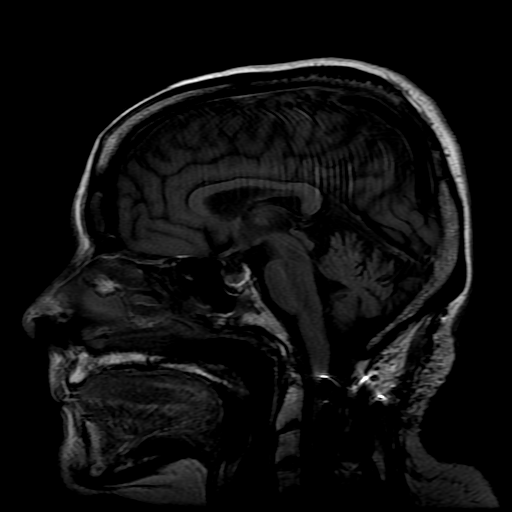
[im 23/23]
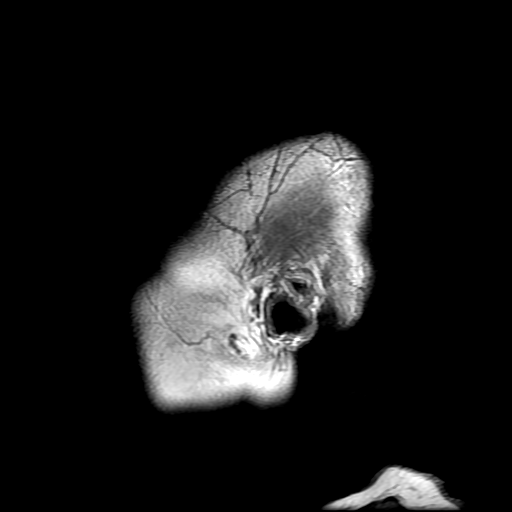

[Series 6: T2 · axial · 5.0mm · 0.43mm/px · z∈[-57,+67]mm · 2 of 22 slices shown (1 of 2)]
[im 1/22]
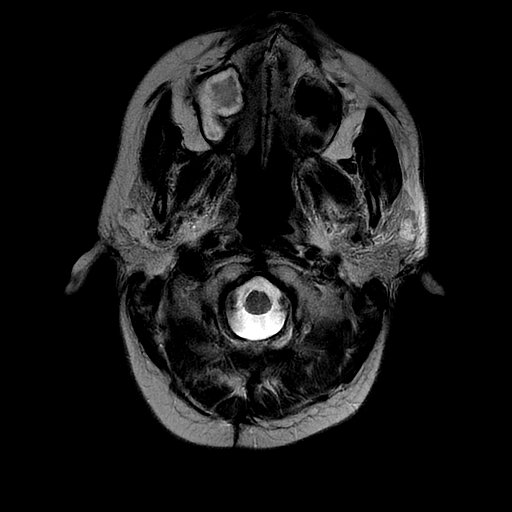
[im 22/22]
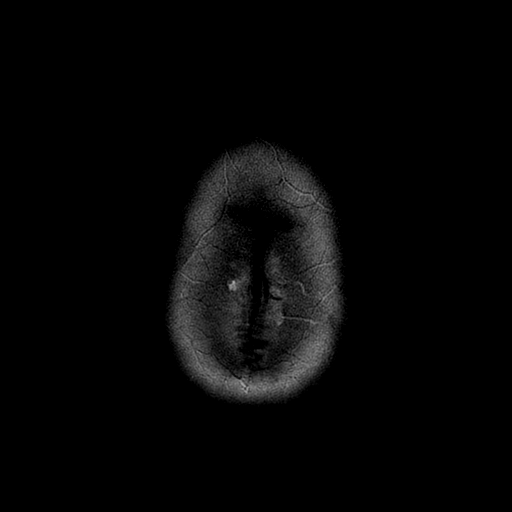

[Series 7: FLAIR · axial · 3.0mm · 0.43mm/px · z∈[-57,+67]mm · 2 of 22 slices shown]
[im 1/22]
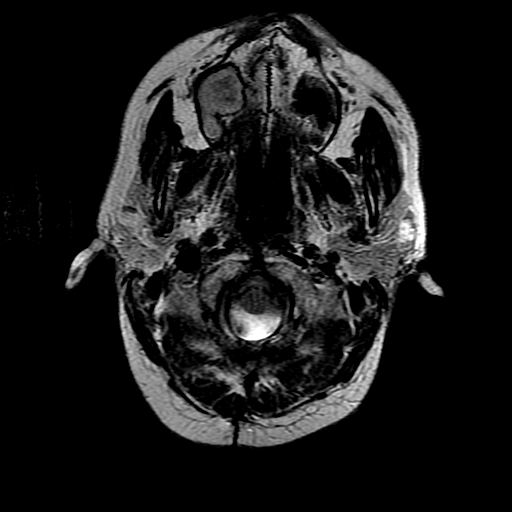
[im 22/22]
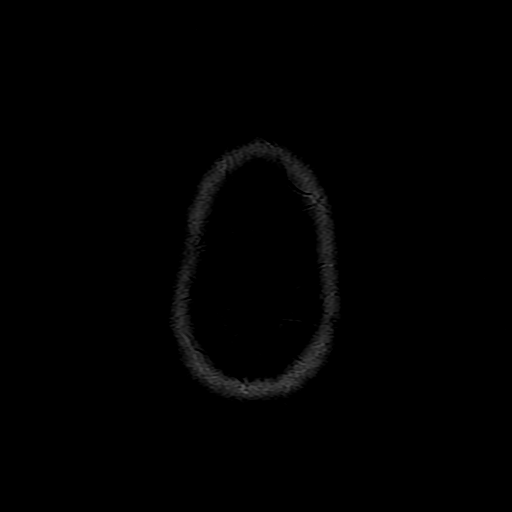

[Series 9: T1 · axial · 3.0mm · 0.47mm/px · 1 of 92 slices shown (2 of 2)]
[im 1/92]
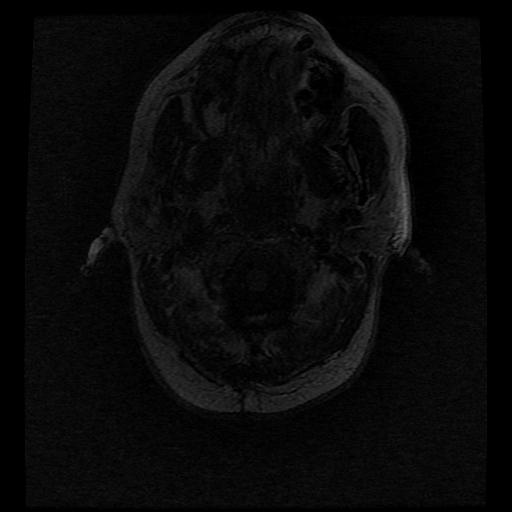

[Series 10: T2 · coronal · 5.0mm · 0.39mm/px · 3 of 25 slices shown (2 of 2)]
[im 1/25]
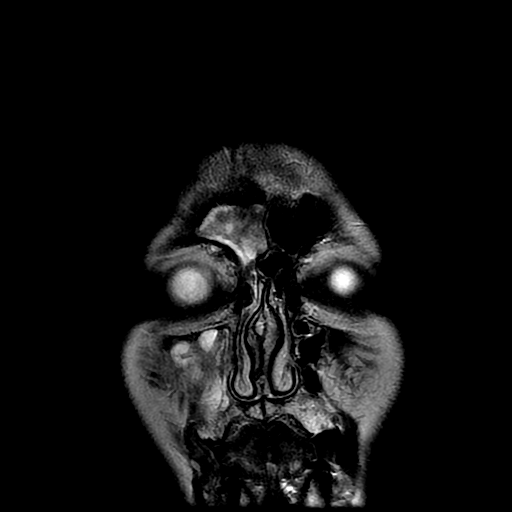
[im 13/25]
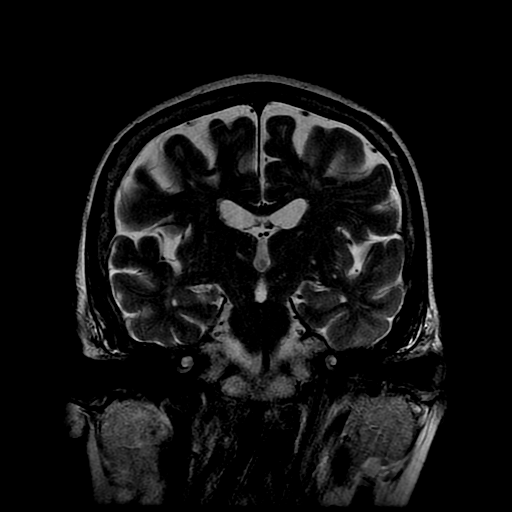
[im 25/25]
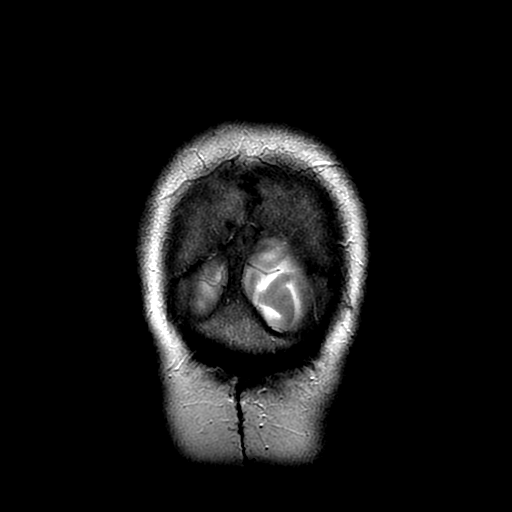

[Series 300: DWI · axial · 3.0mm · 1.09mm/px · z∈[-69,+65]mm · 5 of 46 slices shown (3 of 4)]
[im 1/46]
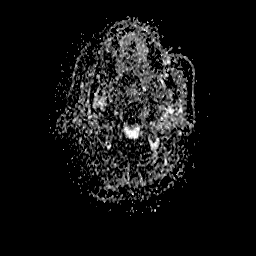
[im 12/46]
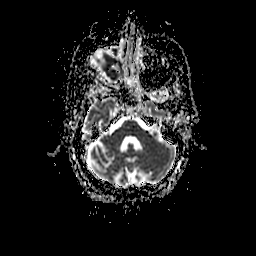
[im 23/46]
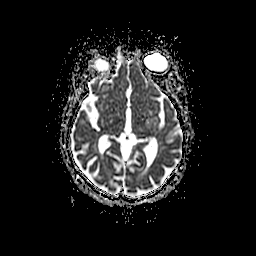
[im 34/46]
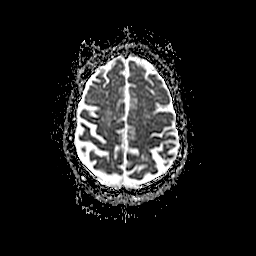
[im 46/46]
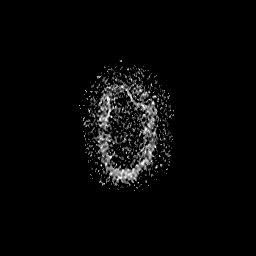

[Series 400: DWI · coronal · 5.0mm · 1.09mm/px · 4 of 32 slices shown (4 of 4)]
[im 1/32]
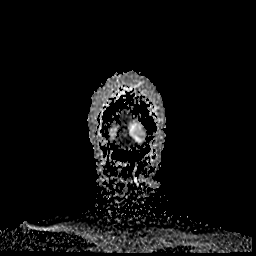
[im 11/32]
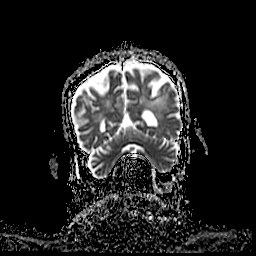
[im 21/32]
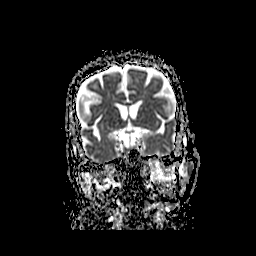
[im 32/32]
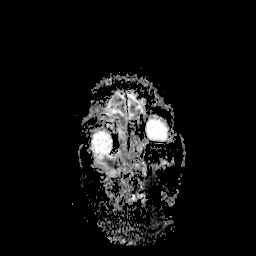

[35 of 48 positions shown; findings below may reference images not displayed]

FINDINGS: Brain: There are small acute cortical and subcortical infarcts in
the high posterior left frontal lobe. Small chronic cortical
infarcts are noted in the parietal lobes and posterior right frontal
lobe, and there are also multiple small chronic infarcts involving
the right greater than left cerebellar hemispheres, thalami, and
basal ganglia. T2 hyperintensities elsewhere in the cerebral white
matter bilaterally are nonspecific but compatible with mild chronic
small vessel ischemic disease. There is mild cerebral atrophy. No
intracranial hemorrhage, mass, midline shift, or extra-axial fluid
collection is identified.

Vascular: Major intracranial vascular flow voids are preserved.

Skull and upper cervical spine: Unremarkable bone marrow signal.
Postoperative changes posteriorly in the upper cervical spine.

Sinuses/Orbits: Bilateral cataract extraction. Complete
opacification of the right maxillary sinus by complex material.
Complete opacification of the right frontal sinus as well. Milder
mucosal thickening in the ethmoid and sphenoid sinuses. Trace right
mastoid fluid.

Other: None.
IMPRESSION: 1. Small acute posterior left frontal lobe infarcts.
2. Chronic ischemia with multiple old cerebral and cerebellar
infarcts as above.

## 2023-11-02 ENCOUNTER — Other Ambulatory Visit: Payer: Self-pay | Admitting: Internal Medicine

## 2023-11-04 ENCOUNTER — Ambulatory Visit (INDEPENDENT_AMBULATORY_CARE_PROVIDER_SITE_OTHER): Payer: Self-pay

## 2023-11-04 DIAGNOSIS — I639 Cerebral infarction, unspecified: Secondary | ICD-10-CM | POA: Diagnosis not present

## 2023-11-04 LAB — CUP PACEART REMOTE DEVICE CHECK
Date Time Interrogation Session: 20250126231349
Implantable Pulse Generator Implant Date: 20230208

## 2023-11-06 NOTE — Progress Notes (Signed)
[facility] Summary Report / Loop Recorder

## 2023-12-09 ENCOUNTER — Ambulatory Visit (INDEPENDENT_AMBULATORY_CARE_PROVIDER_SITE_OTHER): Payer: Self-pay

## 2023-12-09 DIAGNOSIS — I639 Cerebral infarction, unspecified: Secondary | ICD-10-CM

## 2023-12-09 LAB — CUP PACEART REMOTE DEVICE CHECK
Date Time Interrogation Session: 20250302230858
Implantable Pulse Generator Implant Date: 20230208

## 2023-12-12 NOTE — Progress Notes (Signed)
 [facility] Summary Report / Loop Recorder

## 2024-01-09 NOTE — Progress Notes (Signed)
 [facility] Summary Report / Loop Recorder

## 2024-01-10 ENCOUNTER — Other Ambulatory Visit: Payer: Self-pay | Admitting: *Deleted

## 2024-01-10 ENCOUNTER — Other Ambulatory Visit: Payer: Self-pay | Admitting: Internal Medicine

## 2024-01-10 ENCOUNTER — Telehealth: Payer: Self-pay | Admitting: *Deleted

## 2024-01-10 DIAGNOSIS — D6869 Other thrombophilia: Secondary | ICD-10-CM

## 2024-01-10 DIAGNOSIS — E1169 Type 2 diabetes mellitus with other specified complication: Secondary | ICD-10-CM

## 2024-01-10 DIAGNOSIS — I700 Atherosclerosis of aorta: Secondary | ICD-10-CM

## 2024-01-10 DIAGNOSIS — I6523 Occlusion and stenosis of bilateral carotid arteries: Secondary | ICD-10-CM

## 2024-01-10 DIAGNOSIS — Z79899 Other long term (current) drug therapy: Secondary | ICD-10-CM

## 2024-01-10 NOTE — Telephone Encounter (Signed)
 Patient with diagnosis of afib on Eliquis for anticoagulation.    Procedure: CERVICAL EPIDURAL STEROID INJECTIO  Date of procedure: TBD   CHA2DS2-VASc Score = 7   This indicates a 11.2% annual risk of stroke. The patient's score is based upon: CHF History: 0 HTN History: 1 Diabetes History: 1 Stroke History: 2 Vascular Disease History: 1 Age Score: 1 Gender Score: 1      Cryptogenic stroke in February 2023. Linq placed at that time. Did not detect afib until Jan 2025  CrCl  Platelet count   Will need updated labs for clearance  However I will go ahead and send to MD at this time since patient will need at least a 3 day hold for spinal procedure and she has hx of CVA and high CHA2DS2VASc score.   **This guidance is not considered finalized until pre-operative APP has relayed final recommendations.**

## 2024-01-10 NOTE — Telephone Encounter (Signed)
 Pre-op team,   Pharm D requires labs prior to recommendations. Will you please have patient come in for CBC and BMP.   Thank you!  DW

## 2024-01-10 NOTE — Telephone Encounter (Signed)
 Spoke with patients husband and he states that he will have his wife return call to the office to further discuss.

## 2024-01-10 NOTE — Telephone Encounter (Signed)
   Pre-operative Risk Assessment    Patient Name: Connie Campbell  DOB: 07/09/1951 MRN: [id number]   Date of last office visit: 08/07/23 DR. THUKKANI Date of next office visit: NONE   Request for Surgical Clearance    Procedure:   CERVICAL EPIDURAL STEROID INJECTIO  Date of Surgery:  Clearance TBD                                Surgeon:  DR. Wynonia Lawman  Surgeon's Group or Practice Name:  [facility] Phone number:  [phone] Fax number:  [phone]   Type of Clearance Requested:   - Medical  - Pharmacy:  Hold Apixaban (Eliquis) x 3 DAYS PRIOR   Type of Anesthesia:  Not Indicated   Additional requests/questions:    Elpidio Anis   01/10/2024, 10:31 AM

## 2024-01-10 NOTE — Telephone Encounter (Signed)
 Spoke with patient and made her aware of the need for labs. She will come to [facility] downstairs on Monday to have these drawn. Orders placed and released.

## 2024-01-10 NOTE — Telephone Encounter (Signed)
 Please advise holding Eliquis prior to cervical ESI.  Thank you!  DW

## 2024-01-13 ENCOUNTER — Ambulatory Visit (INDEPENDENT_AMBULATORY_CARE_PROVIDER_SITE_OTHER): Payer: Self-pay

## 2024-01-13 DIAGNOSIS — I639 Cerebral infarction, unspecified: Secondary | ICD-10-CM | POA: Diagnosis not present

## 2024-01-13 LAB — CUP PACEART REMOTE DEVICE CHECK
Date Time Interrogation Session: 20250406231002
Implantable Pulse Generator Implant Date: 20230208

## 2024-01-17 LAB — BASIC METABOLIC PANEL WITH GFR
BUN/Creatinine Ratio: 23 (ref 12–28)
BUN: 21 mg/dL (ref 8–27)
CO2: 19 mmol/L — ABNORMAL LOW (ref 20–29)
Calcium: 9.4 mg/dL (ref 8.7–10.3)
Chloride: 105 mmol/L (ref 96–106)
Creatinine, Ser: 0.92 mg/dL (ref 0.57–1.00)
Glucose: 151 mg/dL — ABNORMAL HIGH (ref 70–99)
Potassium: 4.6 mmol/L (ref 3.5–5.2)
Sodium: 141 mmol/L (ref 134–144)
eGFR: 67 mL/min/{1.73_m2} (ref >59)

## 2024-01-17 LAB — CBC
Hematocrit: 39.1 % (ref 34.0–46.6)
Hemoglobin: 12.8 g/dL (ref 11.1–15.9)
MCH: 27.2 pg (ref 26.6–33.0)
MCHC: 32.7 g/dL (ref 31.5–35.7)
MCV: 83 fL (ref 79–97)
Platelets: 244 10*3/uL (ref 150–450)
RBC: 4.7 x10E6/uL (ref 3.77–5.28)
RDW: 14 % (ref 11.7–15.4)
WBC: 7.5 10*3/uL (ref 3.4–10.8)

## 2024-01-17 NOTE — Telephone Encounter (Signed)
Pharmacy please advise on holding Eliquis prior to cervical ESI scheduled for TBD. Thank you.   

## 2024-01-17 NOTE — Telephone Encounter (Signed)
  CrCl 74 Platelet count 244  Per office protocol, patient can hold Eliquis for 3 days prior to procedure.    Message sent to Dr. Lalla Brothers to confirm, secondary to high CHADS2-VASc and need for 3 day hold  **This guidance is not considered finalized until pre-operative APP has relayed final recommendations.**

## 2024-01-20 NOTE — Telephone Encounter (Signed)
 I have attempted to contact this patient by phone but voicemail is full.  I will continue to try later.

## 2024-01-20 NOTE — Telephone Encounter (Signed)
   Name: Connie Campbell  DOB: [birth year]  MRN: [id number]  Primary Cardiologist: Arun K Thukkani, MD   Preoperative team, please contact this patient and set up a phone call appointment for further preoperative risk assessment. Please obtain consent and complete medication review. Thank you for your help.  I confirm that guidance regarding antiplatelet and oral anticoagulation therapy has been completed and, if necessary, noted below.  Per office protocol, patient can hold Eliquis for 3 days prior to procedure.   I also confirmed the patient resides in the state of [place] . As per [facility] Medical Board telemedicine laws, the patient must reside in the state in which the provider is licensed.   Ava Boatman, NP 01/20/2024, 8:02 AM [facility]

## 2024-01-21 NOTE — Telephone Encounter (Signed)
 Tried calling patient again to schedule preop appt no answer unable to leave vm will try again later

## 2024-01-22 ENCOUNTER — Telehealth: Payer: Self-pay | Admitting: *Deleted

## 2024-01-22 NOTE — Telephone Encounter (Signed)
 Pt has been scheduled tele preop appt 01/27/24. Med rec and consent are done.

## 2024-01-22 NOTE — Telephone Encounter (Signed)
 Pt has been scheduled tele preop appt 01/27/24. Med rec and consent are done.      Patient Consent for Virtual Visit        Connie Campbell has provided verbal consent on 01/22/2024 for a virtual visit (video or telephone).   CONSENT FOR VIRTUAL VISIT FOR:  Connie Campbell  By participating in this virtual visit I agree to the following:  I hereby voluntarily request, consent and authorize [facility] and its employed or contracted physicians, physician assistants, nurse practitioners or other licensed health care professionals (the Practitioner), to provide me with telemedicine health care services (the "Services") as deemed necessary by the treating Practitioner. I acknowledge and consent to receive the Services by the Practitioner via telemedicine. I understand that the telemedicine visit will involve communicating with the Practitioner through live audiovisual communication technology and the disclosure of certain medical information by electronic transmission. I acknowledge that I have been given the opportunity to request an in-person assessment or other available alternative prior to the telemedicine visit and am voluntarily participating in the telemedicine visit.  I understand that I have the right to withhold or withdraw my consent to the use of telemedicine in the course of my care at any time, without affecting my right to future care or treatment, and that the Practitioner or I may terminate the telemedicine visit at any time. I understand that I have the right to inspect all information obtained and/or recorded in the course of the telemedicine visit and may receive copies of available information for a reasonable fee.  I understand that some of the potential risks of receiving the Services via telemedicine include:  Delay or interruption in medical evaluation due to technological equipment failure or disruption; Information transmitted may not be sufficient (e.g. poor resolution  of images) to allow for appropriate medical decision making by the Practitioner; and/or  In rare instances, security protocols could fail, causing a breach of personal health information.  Furthermore, I acknowledge that it is my responsibility to provide information about my medical history, conditions and care that is complete and accurate to the best of my ability. I acknowledge that Practitioner's advice, recommendations, and/or decision may be based on factors not within their control, such as incomplete or inaccurate data provided by me or distortions of diagnostic images or specimens that may result from electronic transmissions. I understand that the practice of medicine is not an exact science and that Practitioner makes no warranties or guarantees regarding treatment outcomes. I acknowledge that a copy of this consent can be made available to me via my patient portal [facility] MyChart), or I can request a printed copy by calling the office of [facility].    I understand that my insurance will be billed for this visit.   I have read or had this consent read to me. I understand the contents of this consent, which adequately explains the benefits and risks of the Services being provided via telemedicine.  I have been provided ample opportunity to ask questions regarding this consent and the Services and have had my questions answered to my satisfaction. I give my informed consent for the services to be provided through the use of telemedicine in my medical care

## 2024-01-27 ENCOUNTER — Telehealth: Payer: Self-pay

## 2024-01-27 ENCOUNTER — Ambulatory Visit: Attending: Cardiovascular Disease | Admitting: Nurse Practitioner

## 2024-01-27 DIAGNOSIS — Z01810 Encounter for preprocedural cardiovascular examination: Secondary | ICD-10-CM

## 2024-01-27 NOTE — Progress Notes (Signed)
 Virtual Visit via Telephone Note   Because of Connie Campbell co-morbid illnesses, she is at least at moderate risk for complications without adequate follow up.  This format is felt to be most appropriate for this patient at this time.  Due to technical limitations with video connection (technology), today's appointment will be conducted as an audio only telehealth visit, and Connie Campbell verbally agreed to proceed in this manner.   All issues noted in this document were discussed and addressed.  No physical exam could be performed with this format.  Evaluation Performed:  Preoperative cardiovascular risk assessment _____________   Date:  01/27/2024   Patient ID:  Connie Campbell, DOB 02/13/1951, MRN [id number] Patient Location:  Home Provider location:   Office  Primary Care Provider:  Calton Catholic, PA-C Primary Cardiologist:  Arun K Thukkani, MD  Chief Complaint / Patient Profile   73 y.o. y/o female with a h/o paroxysmal atrial fibrillation, mitral valve regurgitation, aortic atherosclerosis, bilateral carotid artery stenosis, CVA, hypertension, hyperlipidemia, and type 2 diabetes who is pending cervical epidural steroid injection with Dr. Galvin Jules of [facility] and presents today for telephonic preoperative cardiovascular risk assessment.  History of Present Illness    Connie Campbell is a 73 y.o. female who presents via audio/video conferencing for a telehealth visit today.  Pt was last seen in cardiology clinic on 08/07/2023 by Dr. Lorie Rook. At that time Connie Campbell was doing well.  The patient is now pending procedure as outlined above. Since her last visit, she has been stable overall from a cardiac standpoint, however, she has noticed more frequent episodes of shortness of breath.  Symptoms do improve somewhat with as needed Lasix  though she does report multiple episodes of shortness of breath a week with mildly decreased activity tolerance.  Past Medical  History    Past Medical History:  Diagnosis Date   Bilateral neck pain    with tension headaches at times.   Celiac disease    Chronic pain    BUE/BLE and constant.   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   Iodinated Contrast Media Shortness Of Breath    Pt states 20 years ago she had a contrast during a scan and had shortness of breath.   Keflex [Cephalexin] Swelling   Penicillins Swelling   Statins Other (See Comments)    Causes spasms and increased dystonia   Aspirin  Other (See Comments)    GI upset      Bactrim [Sulfamethoxazole-Trimethoprim] Hives   Charentais Melon (French Melon) Other (See Comments)  Erythromycin Base Other (See Comments) and Rash   Ginkgo Biloba Other (See Comments) and Rash   Gluten Meal Diarrhea   Peanut Butter Flavoring Agent (Non-Screening) Hives   Tramadol Other (See Comments)    Depression, GI discomfort   Beta Vulgaris    Ciprofloxacin    Lisinopril Other (See Comments)   Soy Allergy (Obsolete) Other (See Comments)   Trimethoprim Other (See Comments)   Ibuprofen Diarrhea    Other reaction(s): GI intolerance STOMACH PAINS bleeding Other  reaction(s): GI intoleran    Home Medications    Prior to Admission medications   Medication Sig Start Date End Date Taking? Authorizing Provider  apixaban  (ELIQUIS ) 5 MG TABS tablet Take 1 tablet (5 mg total) by mouth 2 (two) times daily. 10/23/23   Nathanel Bal, PA-C  baclofen  (LIORESAL ) 10 MG tablet Take 10 mg by mouth daily as needed for muscle spasms.    [provider]  carisoprodol  (SOMA ) 350 MG tablet Take 1 tablet (350 mg total) by mouth daily as needed (severe muscle spasms). 11/20/21   Love, Renay Carota, PA-C  Evolocumab  (REPATHA  SURECLICK) 140 MG/ML SOAJ INJECT 140 MG INTO THE SKIN EVERY 14 (FOURTEEN) DAYS. 01/10/24   Thukkani, Arun K, MD  furosemide  (LASIX ) 20 MG tablet Take 1 tablet (20 mg total) by mouth daily as needed. 08/07/23   Thukkani, Arun K, MD  JARDIANCE  10 MG TABS tablet TAKE 1 TABLET BY MOUTH DAILY BEFORE BREAKFAST. 06/11/23   Thukkani, Arun K, MD  levETIRAcetam  (KEPPRA ) 750 MG tablet Take 750 mg by mouth every morning. 10/16/21   [provider]  losartan  (COZAAR ) 25 MG tablet TAKE 1 TABLET (25 MG TOTAL) BY MOUTH DAILY. 11/04/23   Thukkani, Arun K, MD  magnesium  gluconate (MAGONATE) 500 MG tablet TAKE 1 TABLET BY MOUTH AT BEDTIME. 02/16/22   Raulkar, Keven Pel, MD  metFORMIN  (GLUCOPHAGE ) 1000 MG tablet Take 1 tablet (1,000 mg total) by mouth 2 (two) times daily with a meal. 11/20/21   Love, Renay Carota, PA-C  Multiple Vitamin (MULTIVITAMIN) capsule Take 1 capsule by mouth daily.    [provider]  naloxone [facility]) nasal spray 4 mg/0.1 mL Place into the nose. 02/11/20   [provider]  omeprazole (PRILOSEC) 40 MG capsule Take 40 mg by mouth as needed. 10/13/22   [provider]  oxyCODONE -acetaminophen  (PERCOCET) 10-325 MG tablet Take 1 tablet by mouth 4 (four) times daily as needed for pain. 11/03/21   [provider]  OZEMPIC , 0.25 OR 0.5 MG/DOSE, 2 MG/1.5ML SOPN Inject 0.5 mg into the skin once a week. On thursdays Patient not  taking: Reported on 01/22/2024 11/20/21   Love, Renay Carota, PA-C  OZEMPIC , 1 MG/DOSE, 4 MG/3ML SOPN Inject into the skin.    [provider]  polyethylene glycol powder (GLYCOLAX /MIRALAX ) 17 GM/SCOOP powder Take by mouth as needed. 02/11/20   [provider]  polyvinyl alcohol  (LIQUIFILM TEARS) 1.4 % ophthalmic solution Place 1 drop into both eyes as needed for dry eyes. 11/20/21   Love, Renay Carota, PA-C  Semaglutide , 1 MG/DOSE, (OZEMPIC , 1 MG/DOSE,) 4 MG/3ML SOPN See admin instructions. Patient thinks it is 1 mg once daily 10/16/23   [provider]  simethicone (MYLICON) 125 MG chewable tablet Chew 125 mg by mouth every 6 (six) hours as needed for flatulence.    [provider]  sitaGLIPtin (JANUVIA) 100 MG tablet Take 100 mg by mouth daily.    [provider]  SYNTHROID  100 MCG tablet Take  100 mcg by mouth daily. 08/24/21   [provider]  timolol  (TIMOPTIC ) 0.5 % ophthalmic solution Place 1 drop into the right eye 2 (two) times daily. 10/07/21   [provider]  TOUJEO MAX SOLOSTAR 300 UNIT/ML Solostar Pen Inject 48 Units into the skin every morning. 04/05/22   [provider]  valACYclovir (VALTREX) 500 MG tablet Take by mouth as needed. For shingle out break 02/11/20   [provider]    Physical Exam    Vital Signs:  Elana Jian does not have vital signs available for review today.  Given telephonic nature of communication, physical exam is limited. AAOx3. NAD. Normal affect.  Speech and respirations are unlabored.  Accessory Clinical Findings    None  Assessment & Plan    1.  Preoperative Cardiovascular Risk Assessment:  Patient reports increased shortness of breath over the past 2 months.  Symptoms improve somewhat with as needed Lasix .  However, given new symptoms, patient will require office visit for preoperative cardiac evaluation.  Patient is agreeable and would prefer to be seen in office.  I will reach  out to our preoperative coverage team to contact patient to schedule an office visit.  Time:   Today, I have spent 5 minutes with the patient with telehealth technology discussing medical history, symptoms, and management plan.     Jude Norton, NP  01/27/2024, 8:48 AM

## 2024-01-28 ENCOUNTER — Other Ambulatory Visit: Payer: Self-pay | Admitting: Internal Medicine

## 2024-02-10 NOTE — Progress Notes (Unsigned)
 Cardiology Office Note    Patient Name: Connie Campbell Date of Encounter: 02/10/2024  Primary Care Provider:  Calton Catholic, PA-C Primary Cardiologist:  Kyra Phy, MD Primary Electrophysiologist: Boyce Byes, MD   Past Medical History    Past Medical History:  Diagnosis Date   Bilateral neck pain    with tension headaches at times.   Celiac disease    Chronic pain    BUE/BLE and constant.   Class 1 obesity due to excess calories with body mass index (BMI) of 31.0 to 31.9 in adult    Dyslipidemia    Dystonia    Glaucoma    Hypertension    Hypothyroidism (acquired)    Migraines    Stroke [facility])    Trigeminal neuralgia     History of Present Illness  Connie Campbell is a 73 y.o. female with a PMH of paroxysmal AF, aortic atherosclerosis, bilateral carotid artery stenosis CVA, history of DVTs, HTN, HLD, DM type II, MVR who presents today for preoperative clearance.  Connie Campbell seen initially by Dr. Lorie Rook in 2023 following CVA that was suffered on 11/2021.  Presented with right leg discoordination and weakness and underwent MRI that demonstrated no large vessel occlusions with negative Doppler.  She had a 2D echo completed that showed normal EF of 60 to 65% with normal atrial size and moderate to severe MR.  She had a loop recorder placed to evaluate for cryptogenic stroke and AF burden.  Completed a limited echo with bubble study that demonstrated no PFO or ASD.  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  She completed a CMR that showed late gadolinium enhancement in the inferior lateral walls.  She completed a coronary CTA for further evaluation that demonstrated moderate nonsignificant LAD disease.  She was last seen by Dr. Lorie Rook on 08/07/2023 for follow-up.  During visit patient reported shortness of breath when lying down and was started on Lasix  20 mg as needed.  She was seen in device alert  on 11/07/2023 showing new AF with RVR for 46 minutes.  She was seen for consultation and evaluation in the AF clinic and started on Eliquis  5 mg twice daily and Plavix  was discontinued.  Connie Campbell presents today with her husband for complaint of shortness of breath and dizziness.  She reports over the past week, she has experienced increased episodes of dizziness and shortness of breath. The dizziness is sudden and often triggered by bending over, standing up, or turning her head quickly. She associates some episodes with her history of neck surgery and vertigo. The shortness of breath occurs more frequently but improves with the use of Lasix . There is a mild decrease in activity tolerance due to these symptoms. She has a loop recorder implanted to monitor her heart rhythm, with the last check in April showing no arrhythmias. She occasionally experiences palpitations but is not always aware of her AFib episodes. She takes Lasix  two to three times a week as needed, which helps with her shortness of breath. There is a family history of heart disease, with her sister recently passing away following heart surgery, and her mother and nieces also having heart disease. She has been increasing her fluid intake over the past month after suspecting dehydration might be contributing to her symptoms. She experiences occasional swelling in her hands and feet, managed with Lasix . She also has neuropathy in her feet and numbness and  tingling in her hands, more pronounced on the right side, which she attributes to her neck surgery. She has a history of vertigo, which she manages by resting when symptoms occur. She experiences nausea sometimes when dizzy, which she associates with her vertigo. She has not been on any medication for vertigo. She has a history of childhood asthma and experiences occasional wheezing. She does not currently use an inhaler but has used one in the past. She is planning to fly to [place]  for her  sister's memorial and has previously required clearance from cardiology for flying due to her heart condition. Patient denies chest pain, palpitations, dyspnea, PND, orthopnea, nausea, vomiting, dizziness, syncope, edema, weight gain, or early satiety.   Discussed the use of AI scribe software for clinical note transcription with the patient, who gave verbal consent to proceed.  History of Present Illness   Review of Systems  Please see the history of present illness.    All other systems reviewed and are otherwise negative except as noted above.  Physical Exam    Wt Readings from Last 3 Encounters:  10/23/23 190 lb (86.2 kg)  08/07/23 183 lb 6.4 oz (83.2 kg)  11/02/22 191 lb 4.8 oz (86.8 kg)   BJ:YNWGN were no vitals filed for this visit.,There is no height or weight on file to calculate BMI. GEN: Well nourished, well developed in no acute distress Neck: No JVD; No carotid bruits Pulmonary: Clear to auscultation without rales, wheezing or rhonchi  Cardiovascular: Normal rate. Regular rhythm. Normal S1. Normal S2.   Murmurs: There is no murmur.  ABDOMEN: Soft, non-tender, non-distended EXTREMITIES: Trace lower extremity edema  EKG/LABS/ Recent Cardiac Studies   ECG personally reviewed by me today -sinus rhythm with rate of 69 bpm and no significant changes consistent with previous EKGs.  Risk Assessment/Calculations:    CHA2DS2-VASc Score = 7   This indicates a 11.2% annual risk of stroke. The patient's score is based upon: CHF History: 0 HTN History: 1 Diabetes History: 1 Stroke History: 2 Vascular Disease History: 1 Age Score: 1 Gender Score: 1         Lab Results  Component Value Date   WBC 7.5 01/16/2024   HGB 12.8 01/16/2024   HCT 39.1 01/16/2024   MCV 83 01/16/2024   PLT 244 01/16/2024   Lab Results  Component Value Date   CREATININE 0.92 01/16/2024   BUN 21 01/16/2024   NA 141 01/16/2024   K 4.6 01/16/2024   CL 105 01/16/2024   CO2 19 (L)  01/16/2024   Lab Results  Component Value Date   CHOL 117 08/07/2023   HDL 51 08/07/2023   LDLCALC 32 08/07/2023   TRIG 218 (H) 08/07/2023   CHOLHDL 2.3 08/07/2023    Lab Results  Component Value Date   HGBA1C 8.0 (H) 11/10/2021   Assessment & Plan    1.  Preoperative clearance: - Patient's RCRI score is 0.9%  The patient affirms she has been doing well without any new cardiac symptoms. They are able to achieve 6 METS without cardiac limitations. Therefore, based on ACC/AHA guidelines, the patient would be at acceptable risk for the planned procedure without further cardiovascular testing. The patient was advised that if she develops new symptoms prior to surgery to contact our office to arrange for a follow-up visit, and she verbalized understanding.   - Patient can hold Eliquis  3 days prior to procedure and should restart postprocedure when surgically safe and pneumostasis is achieved.  2. Atrial  Fibrillation No recent episodes on loop recorder. Symptoms likely due to dehydration or vertigo, not atrial fibrillation. - Interrogate loop recorder to confirm no recent atrial fibrillation. - Continue current management without additional medications.  3. Dehydration Dizziness and shortness of breath may be related to dehydration, especially with Lasix  use. Electrolyte imbalance suspected. - Order metabolic panel to check electrolytes and kidney function. - Check BNP to assess fluid status in heart ventricles. - Advise to continue increased fluid intake. - Instruct to use Lasix  only if weight increases by 2 pounds in 24 hours or if significant shortness of breath occurs.   4. Peripheral Neuropathy: Numbness and tingling in hands and feet, more pronounced on the right side. History of neurosurgery on the spine, possibly related to C1. Symptoms may be exacerbated by dehydration.  5. Vertigo Dizziness possibly related to vertigo, with symptoms of spinning sensation and nausea,  potentially exacerbated by dehydration. - Discuss with primary care provider about starting meclizine for management.  Disposition: Follow-up with Arun K Thukkani, MD or APP in 6 months    Signed, Francene Ing, Retha Cast, NP 02/10/2024, 7:30 PM [facility]

## 2024-02-11 ENCOUNTER — Encounter: Payer: Self-pay | Admitting: Nurse Practitioner

## 2024-02-11 ENCOUNTER — Ambulatory Visit: Attending: Nurse Practitioner | Admitting: Nurse Practitioner

## 2024-02-11 VITALS — BP 130/70 | HR 69 | Ht 63.5 in | Wt 187.0 lb

## 2024-02-11 DIAGNOSIS — I340 Nonrheumatic mitral (valve) insufficiency: Secondary | ICD-10-CM | POA: Diagnosis not present

## 2024-02-11 DIAGNOSIS — Z01810 Encounter for preprocedural cardiovascular examination: Secondary | ICD-10-CM

## 2024-02-11 DIAGNOSIS — I480 Paroxysmal atrial fibrillation: Secondary | ICD-10-CM

## 2024-02-11 DIAGNOSIS — R0602 Shortness of breath: Secondary | ICD-10-CM

## 2024-02-11 DIAGNOSIS — I10 Essential (primary) hypertension: Secondary | ICD-10-CM | POA: Diagnosis not present

## 2024-02-11 NOTE — Patient Instructions (Addendum)
 Medication Instructions:  Your physician recommends that you continue on your current medications as directed. Please refer to the Current Medication list given to you today. *If you need a refill on your cardiac medications before your next appointment, please call your pharmacy*  Lab Work: TODAY-BMET, MAG, & BNP If you have labs (blood work) drawn today and your tests are completely normal, you will receive your results only by: MyChart Message (if you have MyChart) OR A paper copy in the mail If you have any lab test that is abnormal or we need to change your treatment, we will call you to review the results.  Testing/Procedures: NONE ORDERED  Follow-Up: At [facility], you and your health needs are our priority.  As part of our continuing mission to provide you with exceptional heart care, our providers are all part of one team.  This team includes your primary Cardiologist (physician) and Advanced Practice Providers or APPs (Physician Assistants and Nurse Practitioners) who all work together to provide you with the care you need, when you need it.  Your next appointment:   5 month(s)  Provider:   Arun K Thukkani, MD    We recommend signing up for the patient portal called "MyChart".  Sign up information is provided on this After Visit Summary.  MyChart is used to connect with patients for Virtual Visits (Telemedicine).  Patients are able to view lab/test results, encounter notes, upcoming appointments, etc.  Non-urgent messages can be sent to your provider as well.   To learn more about what you can do with MyChart, go to [email or web address].   Other Instructions DRINK 64 OUNCES OF FLUID PER DAY

## 2024-02-12 ENCOUNTER — Telehealth: Payer: Self-pay | Admitting: Student

## 2024-02-12 ENCOUNTER — Other Ambulatory Visit: Payer: Self-pay | Admitting: Student

## 2024-02-12 DIAGNOSIS — E875 Hyperkalemia: Secondary | ICD-10-CM

## 2024-02-12 LAB — BASIC METABOLIC PANEL WITH GFR
BUN/Creatinine Ratio: 24 (ref 12–28)
BUN: 20 mg/dL (ref 8–27)
CO2: 21 mmol/L (ref 20–29)
Calcium: 10 mg/dL (ref 8.7–10.3)
Chloride: 103 mmol/L (ref 96–106)
Creatinine, Ser: 0.85 mg/dL (ref 0.57–1.00)
Glucose: 186 mg/dL — ABNORMAL HIGH (ref 70–99)
Potassium: 5.8 mmol/L (ref 3.5–5.2)
Sodium: 140 mmol/L (ref 134–144)
eGFR: 73 mL/min/{1.73_m2} (ref >59)

## 2024-02-12 LAB — MAGNESIUM: Magnesium: 2.4 mg/dL — ABNORMAL HIGH (ref 1.6–2.3)

## 2024-02-12 LAB — PRO B NATRIURETIC PEPTIDE: NT-Pro BNP: 442 pg/mL — ABNORMAL HIGH (ref 0–301)

## 2024-02-12 NOTE — Telephone Encounter (Signed)
 The overnight fellow was contacted by the lab for a critical potassium of 5.8. I called to speak with the patient but they did not answer the phone and the voicemail was full. I tried calling another phone number on the patients profile but was unable to contact the patient. I ordered a BMET to recheck the potassium.

## 2024-02-12 NOTE — Progress Notes (Signed)
 For hyperkalemia of 5.8

## 2024-02-12 NOTE — Telephone Encounter (Signed)
 Thid attempt to reach patient was successful. Explained the elevated Potassium reported to us  by [facility] and need to redraw to ensure it's accuracy. She states she is at grocery store now, but will come to [facility] at our office after she takes her groceries home. Order has already been placed by overnight fellow. Released at this time so [facility] can see the order.

## 2024-02-12 NOTE — Telephone Encounter (Signed)
 Attempted to reach patient regarding need for repeat BMET to be done after [facility] alerted us  to K of 5.8 on 02/11/24. Chart shows Melita Springer, PA-C was made aware and has already ordered repeat BMET. Reached out to patient to make sure she was aware of need to do labs. Primary number rings once and goes straight to voicemail, but voicemail box not set up. Secondary (home number) rings and rings, but no answering service. Will attempt to re-call patient in half hour.

## 2024-02-13 LAB — BASIC METABOLIC PANEL WITH GFR
BUN/Creatinine Ratio: 22 (ref 12–28)
BUN: 21 mg/dL (ref 8–27)
CO2: 20 mmol/L (ref 20–29)
Calcium: 9.4 mg/dL (ref 8.7–10.3)
Chloride: 101 mmol/L (ref 96–106)
Creatinine, Ser: 0.95 mg/dL (ref 0.57–1.00)
Glucose: 258 mg/dL — ABNORMAL HIGH (ref 70–99)
Potassium: 4.8 mmol/L (ref 3.5–5.2)
Sodium: 137 mmol/L (ref 134–144)
eGFR: 64 mL/min/{1.73_m2} (ref >59)

## 2024-02-13 NOTE — Telephone Encounter (Signed)
 Pt returning call

## 2024-02-13 NOTE — Telephone Encounter (Signed)
 Returned call to patient no answer.Voice mail full.

## 2024-02-17 ENCOUNTER — Ambulatory Visit (INDEPENDENT_AMBULATORY_CARE_PROVIDER_SITE_OTHER): Payer: Self-pay

## 2024-02-17 DIAGNOSIS — I639 Cerebral infarction, unspecified: Secondary | ICD-10-CM | POA: Diagnosis not present

## 2024-02-17 LAB — CUP PACEART REMOTE DEVICE CHECK
Date Time Interrogation Session: 20250511234048
Implantable Pulse Generator Implant Date: 20230208

## 2024-02-18 NOTE — Telephone Encounter (Signed)
 Provider comments acknowledged by this RN.  Encounter and recommendations discussed in person with provider on 01/14/24.

## 2024-02-23 ENCOUNTER — Ambulatory Visit: Payer: Self-pay | Admitting: Cardiology

## 2024-02-25 NOTE — Progress Notes (Signed)
 [facility] Summary Report / Loop Recorder

## 2024-03-19 ENCOUNTER — Ambulatory Visit (INDEPENDENT_AMBULATORY_CARE_PROVIDER_SITE_OTHER): Payer: Self-pay

## 2024-03-19 DIAGNOSIS — I639 Cerebral infarction, unspecified: Secondary | ICD-10-CM | POA: Diagnosis not present

## 2024-03-19 LAB — CUP PACEART REMOTE DEVICE CHECK
Date Time Interrogation Session: 20250611231731
Implantable Pulse Generator Implant Date: 20230208

## 2024-03-21 ENCOUNTER — Ambulatory Visit: Payer: Self-pay | Admitting: Cardiology

## 2024-03-23 ENCOUNTER — Ambulatory Visit: Payer: Self-pay

## 2024-03-24 ENCOUNTER — Other Ambulatory Visit (HOSPITAL_COMMUNITY): Payer: Self-pay | Admitting: Internal Medicine

## 2024-03-24 NOTE — Telephone Encounter (Signed)
 Prescription refill request for Eliquis  received. Indication: Afib   Last office visit:02/11/24 Valentina Gasman)  Scr: 0.95 (02/12/24)  Age: 73 Weight: 84.8kg  Appropriate dose. Refill sent.

## 2024-04-02 NOTE — Progress Notes (Signed)
 [facility] Summary Report / Loop Recorder

## 2024-04-20 ENCOUNTER — Ambulatory Visit: Payer: Self-pay | Admitting: Cardiology

## 2024-04-20 ENCOUNTER — Ambulatory Visit: Payer: Self-pay

## 2024-04-20 DIAGNOSIS — I639 Cerebral infarction, unspecified: Secondary | ICD-10-CM

## 2024-04-20 LAB — CUP PACEART REMOTE DEVICE CHECK
Date Time Interrogation Session: 20250713233403
Implantable Pulse Generator Implant Date: 20230208

## 2024-04-27 ENCOUNTER — Ambulatory Visit: Payer: Self-pay

## 2024-05-13 ENCOUNTER — Other Ambulatory Visit: Payer: Self-pay | Admitting: Internal Medicine

## 2024-05-13 NOTE — Progress Notes (Signed)
 [facility] Summary Report / Loop Recorder

## 2024-05-21 ENCOUNTER — Ambulatory Visit (INDEPENDENT_AMBULATORY_CARE_PROVIDER_SITE_OTHER): Payer: Self-pay

## 2024-05-21 DIAGNOSIS — I639 Cerebral infarction, unspecified: Secondary | ICD-10-CM

## 2024-05-21 LAB — CUP PACEART REMOTE DEVICE CHECK
Date Time Interrogation Session: 20250813232533
Implantable Pulse Generator Implant Date: 20230208

## 2024-05-22 ENCOUNTER — Ambulatory Visit: Payer: Self-pay | Admitting: Cardiology

## 2024-06-01 ENCOUNTER — Ambulatory Visit: Payer: Self-pay

## 2024-06-22 ENCOUNTER — Ambulatory Visit (INDEPENDENT_AMBULATORY_CARE_PROVIDER_SITE_OTHER): Payer: Self-pay

## 2024-06-22 DIAGNOSIS — I639 Cerebral infarction, unspecified: Secondary | ICD-10-CM | POA: Diagnosis not present

## 2024-06-23 LAB — CUP PACEART REMOTE DEVICE CHECK
Date Time Interrogation Session: 20250914232643
Implantable Pulse Generator Implant Date: 20230208

## 2024-06-25 ENCOUNTER — Ambulatory Visit: Payer: Self-pay | Admitting: Cardiology

## 2024-06-29 NOTE — Progress Notes (Signed)
 Remote Loop Recorder Transmission

## 2024-07-02 NOTE — Progress Notes (Signed)
 Remote Loop Recorder Transmission

## 2024-07-06 ENCOUNTER — Ambulatory Visit: Payer: Self-pay

## 2024-07-15 NOTE — Progress Notes (Signed)
 Remote Loop Recorder Transmission

## 2024-07-23 ENCOUNTER — Ambulatory Visit (INDEPENDENT_AMBULATORY_CARE_PROVIDER_SITE_OTHER): Payer: Self-pay

## 2024-07-23 ENCOUNTER — Ambulatory Visit: Payer: Self-pay

## 2024-07-23 DIAGNOSIS — I639 Cerebral infarction, unspecified: Secondary | ICD-10-CM | POA: Diagnosis not present

## 2024-07-23 LAB — CUP PACEART REMOTE DEVICE CHECK
Date Time Interrogation Session: 20251015232235
Implantable Pulse Generator Implant Date: 20230208

## 2024-07-27 ENCOUNTER — Ambulatory Visit: Payer: Self-pay | Admitting: Cardiology

## 2024-07-29 NOTE — Progress Notes (Signed)
 Remote Loop Recorder Transmission

## 2024-08-06 ENCOUNTER — Ambulatory Visit: Payer: Self-pay

## 2024-08-08 ENCOUNTER — Other Ambulatory Visit: Payer: Self-pay | Admitting: Internal Medicine

## 2024-08-23 ENCOUNTER — Ambulatory Visit: Payer: Self-pay

## 2024-08-24 ENCOUNTER — Ambulatory Visit: Payer: Self-pay

## 2024-08-26 ENCOUNTER — Ambulatory Visit: Attending: Cardiology

## 2024-08-26 DIAGNOSIS — I480 Paroxysmal atrial fibrillation: Secondary | ICD-10-CM | POA: Diagnosis not present

## 2024-08-27 ENCOUNTER — Ambulatory Visit: Payer: Self-pay | Admitting: Cardiology

## 2024-08-27 LAB — CUP PACEART REMOTE DEVICE CHECK
Date Time Interrogation Session: 20251118233125
Implantable Pulse Generator Implant Date: 20230208

## 2024-08-28 NOTE — Progress Notes (Signed)
 Remote Loop Recorder Transmission

## 2024-09-07 ENCOUNTER — Ambulatory Visit: Payer: Self-pay

## 2024-09-10 ENCOUNTER — Other Ambulatory Visit: Payer: Self-pay | Admitting: Internal Medicine

## 2024-09-14 ENCOUNTER — Other Ambulatory Visit: Payer: Self-pay | Admitting: Internal Medicine

## 2024-09-23 ENCOUNTER — Ambulatory Visit: Payer: Self-pay

## 2024-09-26 ENCOUNTER — Ambulatory Visit

## 2024-09-26 DIAGNOSIS — I480 Paroxysmal atrial fibrillation: Secondary | ICD-10-CM | POA: Diagnosis not present

## 2024-09-27 LAB — CUP PACEART REMOTE DEVICE CHECK
Date Time Interrogation Session: 20251219232257
Implantable Pulse Generator Implant Date: 20230208

## 2024-09-28 NOTE — Progress Notes (Signed)
 Remote Loop Recorder Transmission

## 2024-10-07 ENCOUNTER — Ambulatory Visit: Payer: Self-pay | Admitting: Cardiovascular Disease

## 2024-10-08 ENCOUNTER — Ambulatory Visit: Payer: Self-pay

## 2024-10-10 ENCOUNTER — Other Ambulatory Visit: Payer: Self-pay | Admitting: Cardiology

## 2024-10-12 NOTE — Telephone Encounter (Signed)
 Pt last saw Jackee Alberts, NP 02/11/24, last labs 02/12/24 Creat 0.95, age 74, weight 84.8kg, based on specified criteria pt is on appropriate dosage of Eliquis  5mg  BID for afib.  Will refill rx.

## 2024-10-13 ENCOUNTER — Other Ambulatory Visit: Payer: Self-pay | Admitting: Internal Medicine

## 2024-10-14 MED ORDER — LOSARTAN POTASSIUM 25 MG PO TABS: 25.0000 mg | ORAL_TABLET | Freq: Every day | ORAL | 2 refills | Status: AC

## 2024-10-16 ENCOUNTER — Encounter: Payer: Self-pay | Admitting: Internal Medicine

## 2024-10-24 ENCOUNTER — Ambulatory Visit: Payer: Self-pay

## 2024-10-27 ENCOUNTER — Ambulatory Visit

## 2024-10-27 DIAGNOSIS — I480 Paroxysmal atrial fibrillation: Secondary | ICD-10-CM | POA: Diagnosis not present

## 2024-10-28 LAB — CUP PACEART REMOTE DEVICE CHECK
Date Time Interrogation Session: 20260119231740
Implantable Pulse Generator Implant Date: 20230208

## 2024-10-30 NOTE — Progress Notes (Signed)
 Remote Loop Recorder Transmission

## 2024-11-03 ENCOUNTER — Ambulatory Visit: Payer: Self-pay | Admitting: Cardiovascular Disease

## 2024-11-05 ENCOUNTER — Ambulatory Visit: Attending: Emergency Medicine | Admitting: Emergency Medicine

## 2024-11-05 ENCOUNTER — Other Ambulatory Visit (HOSPITAL_COMMUNITY): Payer: Self-pay

## 2024-11-05 ENCOUNTER — Telehealth: Payer: Self-pay

## 2024-11-05 ENCOUNTER — Encounter: Payer: Self-pay | Admitting: Emergency Medicine

## 2024-11-05 VITALS — BP 130/82 | HR 87 | Wt 193.0 lb

## 2024-11-05 DIAGNOSIS — I6523 Occlusion and stenosis of bilateral carotid arteries: Secondary | ICD-10-CM | POA: Diagnosis not present

## 2024-11-05 DIAGNOSIS — I340 Nonrheumatic mitral (valve) insufficiency: Secondary | ICD-10-CM | POA: Diagnosis not present

## 2024-11-05 DIAGNOSIS — Z8673 Personal history of transient ischemic attack (TIA), and cerebral infarction without residual deficits: Secondary | ICD-10-CM

## 2024-11-05 DIAGNOSIS — I480 Paroxysmal atrial fibrillation: Secondary | ICD-10-CM | POA: Diagnosis not present

## 2024-11-05 DIAGNOSIS — I639 Cerebral infarction, unspecified: Secondary | ICD-10-CM

## 2024-11-05 DIAGNOSIS — I700 Atherosclerosis of aorta: Secondary | ICD-10-CM

## 2024-11-05 DIAGNOSIS — E785 Hyperlipidemia, unspecified: Secondary | ICD-10-CM

## 2024-11-05 DIAGNOSIS — E1169 Type 2 diabetes mellitus with other specified complication: Secondary | ICD-10-CM

## 2024-11-05 DIAGNOSIS — G249 Dystonia, unspecified: Secondary | ICD-10-CM | POA: Diagnosis not present

## 2024-11-05 DIAGNOSIS — G500 Trigeminal neuralgia: Secondary | ICD-10-CM

## 2024-11-05 MED ORDER — APIXABAN 5 MG PO TABS
5.0000 mg | ORAL_TABLET | Freq: Two times a day (BID) | ORAL | 5 refills | Status: AC
  Filled 2024-11-05: qty 60, 30d supply, fill #0

## 2024-11-05 MED ORDER — REPATHA SURECLICK 140 MG/ML ~~LOC~~ SOAJ
1.0000 | SUBCUTANEOUS | 1 refills | Status: AC
  Filled 2024-11-05: qty 6, 84d supply, fill #0

## 2024-11-05 NOTE — Patient Instructions (Addendum)
 Medication Instructions:  Your refills have been sent to the [facility].  A referral has been placed for you for Neurology.  Your prior approval has been submitted for the Rep[facility]  medication. *If you need a refill on your cardiac medications before your next appointment, please call your pharmacy*  Lab Work: BMET, CBC If you have labs (blood work) drawn today and your tests are completely normal, you will receive your results only by: MyChart Message (if you have MyChart) OR A paper copy in the mail If you have any lab test that is abnormal or we need to change your treatment, we will call you to review the results.  Testing/Procedures: Your physician has requested that you have an echocardiogram. Echocardiography is a painless test that uses sound waves to create images of your heart. It provides your doctor with information about the size and shape of your heart and how well your hearts chambers and valves are working. This procedure takes approximately one hour. There are no restrictions for this procedure.   Please do NOT wear cologne, perfume, aftershave, or lotions (deodorant is allowed).   Please arrive 15 minutes prior to your appointment time.   Follow-Up: At [facility], you and your health needs are our priority.  As part of our continuing mission to provide you with exceptional heart care, our providers are all part of one team.  This team includes your primary Cardiologist (physician) and Advanced Practice Providers or APPs (Physician Assistants and Nurse Practitioners) who all work together to provide you with the care you need, when you need it.  Your next appointment:   6 month(s)  Provider:   Arun K Thukkani, MD    We recommend signing up for the patient portal called MyChart.  Sign up information is provided on this After Visit Summary.  MyChart is used to connect with patients for Virtual Visits (Telemedicine).  Patients are able to view  lab/test results, encounter notes, upcoming appointments, etc.  Non-urgent messages can be sent to your provider as well.   To learn more about what you can do with MyChart, go to [email or web address].   Other Instructions

## 2024-11-06 ENCOUNTER — Other Ambulatory Visit: Payer: Self-pay

## 2024-11-06 DIAGNOSIS — I700 Atherosclerosis of aorta: Secondary | ICD-10-CM

## 2024-11-06 DIAGNOSIS — I6523 Occlusion and stenosis of bilateral carotid arteries: Secondary | ICD-10-CM

## 2024-11-06 DIAGNOSIS — E1169 Type 2 diabetes mellitus with other specified complication: Secondary | ICD-10-CM

## 2024-11-06 LAB — CBC
Hematocrit: 34.7 % (ref 34.0–46.6)
Hemoglobin: 11 g/dL — ABNORMAL LOW (ref 11.1–15.9)
MCH: 23.5 pg — ABNORMAL LOW (ref 26.6–33.0)
MCHC: 31.7 g/dL (ref 31.5–35.7)
MCV: 74 fL — ABNORMAL LOW (ref 79–97)
Platelets: 271 10*3/uL (ref 150–450)
RBC: 4.69 x10E6/uL (ref 3.77–5.28)
RDW: 15.3 % (ref 11.7–15.4)
WBC: 6.6 10*3/uL (ref 3.4–10.8)

## 2024-11-06 LAB — BASIC METABOLIC PANEL WITH GFR
BUN/Creatinine Ratio: 25 (ref 12–28)
BUN: 21 mg/dL (ref 8–27)
CO2: 22 mmol/L (ref 20–29)
Calcium: 9.7 mg/dL (ref 8.7–10.3)
Chloride: 101 mmol/L (ref 96–106)
Creatinine, Ser: 0.85 mg/dL (ref 0.57–1.00)
Glucose: 171 mg/dL — ABNORMAL HIGH (ref 70–99)
Potassium: 4.9 mmol/L (ref 3.5–5.2)
Sodium: 139 mmol/L (ref 134–144)
eGFR: 73 mL/min/{1.73_m2}

## 2024-11-07 ENCOUNTER — Ambulatory Visit: Payer: Self-pay | Admitting: Emergency Medicine

## 2024-11-09 ENCOUNTER — Ambulatory Visit: Payer: Self-pay

## 2024-11-09 NOTE — Telephone Encounter (Signed)
 Attempted to reach patient but constant  busy signal   no vm set up

## 2024-11-09 NOTE — Telephone Encounter (Signed)
-----   Message from Miriam Shams, NP sent at 11/07/2024 10:19 AM EST ----- Hgb mildly decreased hbg, mcv, mch. Lets plan to repeat a CBC in 1 week to assess for change. No reported signs of bleeding/bruising at visit. Please also place order for fasting lipid panel, and  make sure patient fasts prior to labs.   Kidney function and electrolytes are stable.

## 2024-11-10 NOTE — Progress Notes (Signed)
 No answer on pt, spouse or daughter's phone

## 2024-11-24 ENCOUNTER — Ambulatory Visit: Payer: Self-pay

## 2024-11-27 ENCOUNTER — Ambulatory Visit

## 2024-12-01 ENCOUNTER — Ambulatory Visit (HOSPITAL_COMMUNITY)

## 2024-12-10 ENCOUNTER — Ambulatory Visit: Payer: Self-pay

## 2024-12-28 ENCOUNTER — Ambulatory Visit

## 2025-01-28 ENCOUNTER — Ambulatory Visit

## 2025-02-28 ENCOUNTER — Ambulatory Visit

## 2025-03-31 ENCOUNTER — Ambulatory Visit

## 2025-05-01 ENCOUNTER — Ambulatory Visit

## 2025-06-01 ENCOUNTER — Ambulatory Visit

## 2025-07-02 ENCOUNTER — Ambulatory Visit

## 2025-08-02 ENCOUNTER — Ambulatory Visit

## 2025-09-02 ENCOUNTER — Ambulatory Visit

## 2025-10-03 ENCOUNTER — Ambulatory Visit

## 2025-11-03 ENCOUNTER — Ambulatory Visit
